# Patient Record
Sex: Male | Born: 1935 | Race: White | Hispanic: No | Smoking: Never smoker
Health system: Southern US, Community
[De-identification: ages and names within clinical notes are randomized; demographics above are authoritative.]

## PROBLEM LIST (undated history)

## (undated) DIAGNOSIS — F0280 Dementia in other diseases classified elsewhere without behavioral disturbance: Secondary | ICD-10-CM

## (undated) DIAGNOSIS — G8929 Other chronic pain: Secondary | ICD-10-CM

## (undated) DIAGNOSIS — R42 Dizziness and giddiness: Secondary | ICD-10-CM

## (undated) DIAGNOSIS — H9190 Unspecified hearing loss, unspecified ear: Secondary | ICD-10-CM

## (undated) DIAGNOSIS — M549 Dorsalgia, unspecified: Secondary | ICD-10-CM

## (undated) DIAGNOSIS — K56609 Unspecified intestinal obstruction, unspecified as to partial versus complete obstruction: Secondary | ICD-10-CM

## (undated) DIAGNOSIS — E079 Disorder of thyroid, unspecified: Secondary | ICD-10-CM

## (undated) DIAGNOSIS — G309 Alzheimer's disease, unspecified: Secondary | ICD-10-CM

## (undated) DIAGNOSIS — E871 Hypo-osmolality and hyponatremia: Secondary | ICD-10-CM

## (undated) DIAGNOSIS — R109 Unspecified abdominal pain: Secondary | ICD-10-CM

## (undated) DIAGNOSIS — D649 Anemia, unspecified: Secondary | ICD-10-CM

## (undated) DIAGNOSIS — K5732 Diverticulitis of large intestine without perforation or abscess without bleeding: Secondary | ICD-10-CM

## (undated) DIAGNOSIS — K469 Unspecified abdominal hernia without obstruction or gangrene: Secondary | ICD-10-CM

## (undated) DIAGNOSIS — M1990 Unspecified osteoarthritis, unspecified site: Secondary | ICD-10-CM

## (undated) DIAGNOSIS — R110 Nausea: Secondary | ICD-10-CM

## (undated) DIAGNOSIS — K219 Gastro-esophageal reflux disease without esophagitis: Secondary | ICD-10-CM

## (undated) DIAGNOSIS — I671 Cerebral aneurysm, nonruptured: Secondary | ICD-10-CM

---

## 2008-11-08 ENCOUNTER — Ambulatory Visit (HOSPITAL_COMMUNITY): Admission: RE | Admit: 2008-11-08 | Discharge: 2008-11-08 | Payer: Self-pay | Admitting: Nurse Practitioner

## 2011-05-09 ENCOUNTER — Emergency Department (HOSPITAL_COMMUNITY): Payer: Medicare Other

## 2011-05-09 ENCOUNTER — Encounter: Payer: Self-pay | Admitting: *Deleted

## 2011-05-09 ENCOUNTER — Inpatient Hospital Stay (HOSPITAL_COMMUNITY)
Admission: EM | Admit: 2011-05-09 | Discharge: 2011-05-12 | DRG: 389 | Disposition: A | Discharge status: Home / Self Care | Payer: Medicare Other | Attending: Family Medicine | Admitting: Family Medicine

## 2011-05-09 ENCOUNTER — Inpatient Hospital Stay (HOSPITAL_COMMUNITY): Payer: Medicare Other

## 2011-05-09 ENCOUNTER — Other Ambulatory Visit: Payer: Self-pay

## 2011-05-09 DIAGNOSIS — E871 Hypo-osmolality and hyponatremia: Secondary | ICD-10-CM

## 2011-05-09 DIAGNOSIS — E860 Dehydration: Secondary | ICD-10-CM

## 2011-05-09 DIAGNOSIS — R112 Nausea with vomiting, unspecified: Secondary | ICD-10-CM

## 2011-05-09 DIAGNOSIS — K56609 Unspecified intestinal obstruction, unspecified as to partial versus complete obstruction: Secondary | ICD-10-CM

## 2011-05-09 DIAGNOSIS — K560 Paralytic ileus: Principal | ICD-10-CM | POA: Diagnosis present

## 2011-05-09 DIAGNOSIS — R1110 Vomiting, unspecified: Secondary | ICD-10-CM

## 2011-05-09 LAB — URINALYSIS, ROUTINE W REFLEX MICROSCOPIC
Bilirubin Urine: NEGATIVE
Glucose, UA: NEGATIVE mg/dL
Hgb urine dipstick: NEGATIVE
Specific Gravity, Urine: 1.02 (ref 1.005–1.030)
pH: 6 (ref 5.0–8.0)

## 2011-05-09 LAB — COMPREHENSIVE METABOLIC PANEL
ALT: 27 U/L (ref 0–53)
AST: 35 U/L (ref 0–37)
Albumin: 3.9 g/dL (ref 3.5–5.2)
Alkaline Phosphatase: 60 U/L (ref 39–117)
GFR calc Af Amer: 90 mL/min — ABNORMAL LOW (ref >90)
Glucose, Bld: 150 mg/dL — ABNORMAL HIGH (ref 70–99)
Potassium: 3.6 mEq/L (ref 3.5–5.1)
Sodium: 120 mEq/L — ABNORMAL LOW (ref 135–145)
Total Protein: 7.4 g/dL (ref 6.0–8.3)

## 2011-05-09 LAB — DIFFERENTIAL
Basophils Absolute: 0 10*3/uL (ref 0.0–0.1)
Eosinophils Absolute: 0 10*3/uL (ref 0.0–0.7)
Lymphs Abs: 0.7 10*3/uL (ref 0.7–4.0)
Neutrophils Relative %: 78 % — ABNORMAL HIGH (ref 43–77)

## 2011-05-09 LAB — CBC
MCH: 32.2 pg (ref 26.0–34.0)
Platelets: 214 10*3/uL (ref 150–400)
RBC: 3.88 MIL/uL — ABNORMAL LOW (ref 4.22–5.81)
RDW: 13 % (ref 11.5–15.5)
WBC: 7.6 10*3/uL (ref 4.0–10.5)

## 2011-05-09 LAB — LACTIC ACID, PLASMA: Lactic Acid, Venous: 2.7 mmol/L — ABNORMAL HIGH (ref 0.5–2.2)

## 2011-05-09 LAB — TROPONIN I: Troponin I: <0.3 ng/mL (ref <0.30)

## 2011-05-09 MED ORDER — MORPHINE SULFATE 4 MG/ML IJ SOLN
4.0000 mg | Freq: Once | INTRAMUSCULAR | Status: AC
  Administered 2011-05-09: 4 mg via INTRAVENOUS
  Filled 2011-05-09: qty 1

## 2011-05-09 MED ORDER — ONDANSETRON HCL 4 MG/2ML IJ SOLN
4.0000 mg | Freq: Once | INTRAMUSCULAR | Status: AC
  Administered 2011-05-09: 4 mg via INTRAVENOUS
  Filled 2011-05-09: qty 2

## 2011-05-09 MED ORDER — MORPHINE SULFATE 2 MG/ML IJ SOLN
2.0000 mg | INTRAMUSCULAR | Status: DC | PRN
  Administered 2011-05-09 – 2011-05-10 (×3): 2 mg via INTRAVENOUS
  Filled 2011-05-09 (×4): qty 1

## 2011-05-09 MED ORDER — PNEUMOCOCCAL VAC POLYVALENT 25 MCG/0.5ML IJ INJ
0.5000 mL | INJECTION | INTRAMUSCULAR | Status: AC
  Filled 2011-05-09: qty 0.5

## 2011-05-09 MED ORDER — MOXIFLOXACIN HCL IN NACL 400 MG/250ML IV SOLN: 400.0000 mg | Freq: Once | INTRAVENOUS | Status: DC

## 2011-05-09 MED ORDER — SODIUM CHLORIDE 0.9 % IV SOLN
999.0000 mL | INTRAVENOUS | Status: DC
  Administered 2011-05-09: 16:00:00 via INTRAVENOUS
  Administered 2011-05-09: 999 mL via INTRAVENOUS

## 2011-05-09 MED ORDER — IOHEXOL 300 MG/ML  SOLN
100.0000 mL | Freq: Once | INTRAMUSCULAR | Status: AC | PRN
  Administered 2011-05-09: 100 mL via INTRAVENOUS

## 2011-05-09 MED ORDER — PANTOPRAZOLE SODIUM 40 MG IV SOLR
40.0000 mg | INTRAVENOUS | Status: DC
  Administered 2011-05-10 – 2011-05-11 (×2): 40 mg via INTRAVENOUS
  Filled 2011-05-09 (×2): qty 40

## 2011-05-09 MED ORDER — SODIUM CHLORIDE 0.9 % IV BOLUS (SEPSIS)
500.0000 mL | Freq: Once | INTRAVENOUS | Status: AC
  Administered 2011-05-09: 17:00:00 via INTRAVENOUS

## 2011-05-09 MED ORDER — ONDANSETRON HCL 4 MG PO TABS: 4.0000 mg | ORAL_TABLET | Freq: Four times a day (QID) | ORAL | Status: DC | PRN

## 2011-05-09 MED ORDER — INFLUENZA VIRUS VACC SPLIT PF IM SUSP
0.5000 mL | INTRAMUSCULAR | Status: AC
  Filled 2011-05-09: qty 0.5

## 2011-05-09 MED ORDER — ONDANSETRON HCL 4 MG/2ML IJ SOLN
4.0000 mg | Freq: Four times a day (QID) | INTRAMUSCULAR | Status: DC | PRN
  Administered 2011-05-10 (×2): 4 mg via INTRAVENOUS
  Filled 2011-05-09 (×2): qty 2

## 2011-05-09 MED ORDER — SODIUM CHLORIDE 0.9 % IV SOLN
INTRAVENOUS | Status: DC
  Administered 2011-05-09: 22:00:00 via INTRAVENOUS
  Administered 2011-05-10: 950 mL via INTRAVENOUS
  Administered 2011-05-11: 1000 mL via INTRAVENOUS

## 2011-05-09 MED ORDER — SODIUM CHLORIDE 0.9 % IV BOLUS (SEPSIS)
1000.0000 mL | Freq: Once | INTRAVENOUS | Status: AC
  Administered 2011-05-09: 1000 mL via INTRAVENOUS

## 2011-05-09 NOTE — ED Notes (Signed)
Pt states abdominal pain and vomiting. Hx of diverticulitis. Seen PMD yesterday. Pt actively vomiting in triage.

## 2011-05-09 NOTE — ED Notes (Signed)
Pt given CT scan bottles of contrast, started vomiting after initiating drinking of said fluid. Peyton Najjar from CT in room.

## 2011-05-09 NOTE — ED Notes (Signed)
Pt states is extremely nauseated with lower abdomin pain. Pt has vomited x 2.  Small amt yellow emesis noted. Pt is pallor in color with flushed cheeks. Son states this is abnormal coloring for his father. Labs drawn and sent, results pending.

## 2011-05-09 NOTE — ED Provider Notes (Signed)
Scribed for Joya Gaskins, MD, the patient was seen in room [facility] . This chart was scribed by Ellie Lunch.   CSN: [id number] Arrival date & time: 05/09/2011  3:56 PM   First MD Initiated Contact with Patient 05/09/11 1606      Chief Complaint  Patient presents with  . Emesis    Patient is a 77 y.o. male presenting with vomiting. The history is provided by the patient and a relative. No language interpreter was used.  Emesis  This is a new problem. The current episode started yesterday. The problem occurs continuously. The problem has been gradually worsening (improved yesterday, but worsening today). The emesis has an appearance of stomach contents. There has been no fever. Associated symptoms include abdominal pain, chills and diarrhea. Pertinent negatives include no fever.   Pt seen at 4:19 PM Larry Cox is a 77 y.o. male who presents to the Emergency Department complaining of emesis starting 1 day ago Pt starting having n/v and chills. Pt visited PC Dr. Olegario Messier in [place] and reports feeling improved last night. This morning n/v returned with associated diarrhea and abdominal pain. Pt has h/o diverticulitis. Denies CP, SOB, or fever. Pt not currently on abx, no history of abdominal surgeries.   Past Medical History  Diagnosis Date  . Diverticulitis     Past Surgical Hx - none   History  Substance Use Topics  . Smoking status: Former Games developer  . Smokeless tobacco: Not on file  . Alcohol Use: Yes     Occ     Review of Systems  Constitutional: Positive for chills. Negative for fever.  Respiratory: Negative for shortness of breath.   Cardiovascular: Negative for chest pain.  Gastrointestinal: Positive for vomiting, abdominal pain and diarrhea.  All other systems reviewed and are negative.  10 Systems reviewed and are negative for acute change except as noted in the HPI.   Allergies  Review of patient's allergies indicates no known allergies.  Home  Medications  No current outpatient prescriptions on file.  BP 103/87  Pulse 76  Resp 28  Ht 5\' 8"  (1.727 m)  Wt 163 lb (73.936 kg)  BMI 24.78 kg/m2  SpO2 99% BP 128/75  Pulse 64  Resp 26  Ht 5\' 8"  (1.727 m)  Wt 163 lb (73.936 kg)  BMI 24.78 kg/m2  SpO2 100% BP 137/71  Pulse 68  Temp(Src) 97.8 F (36.6 C) (Oral)  Resp 20  Ht 5\' 8"  (1.727 m)  Wt 163 lb (73.936 kg)  BMI 24.78 kg/m2  SpO2 99%    Physical Exam CONSTITUTIONAL: Well developed/well nourished, uncomfortable appearing HEAD AND FACE: Normocephalic/atraumatic EYES: EOMI/PERRL, no scleral icterus ENMT: Mucous membranes dry NECK: supple no meningeal signs SPINE:entire spine nontender CV: no murmurs/rubs/gallops noted LUNGS: Lungs are clear to auscultation bilaterally, no apparent distress ABDOMEN: soft, tender epigastric and LLQ. No rebound or guarding. Positive bowel sounds GU:no cva tenderness. No hernia. No testicular tenderness. Chaperone present. NEURO: Pt is awake/alert, moves all extremitiesx4 EXTREMITIES: pulses normal, full ROM SKIN: warm, color normal PSYCH: no abnormalities of mood noted ED Course  Procedures   6:28 PM Pt still with active vomiting, will need CT imaging for significant abd tenderness Labs reviewed - dehydration, hyponatremia  7:42 PM Pt improved.  Will admit for significant lab abnormality and active nausea Pt agreeble D/w dr Onalee Hua, will admit.  She requests CXR and troponin  OTHER DATA REVIEWED: Nursing notes, vital signs, and past medical records reviewed. xrays reviewed and considered  All labs/vitals reviewed and considered   DIAGNOSTIC STUDIES: Oxygen Saturation is 99% on room air, normal by my interpretation.      Labs Reviewed  CBC  DIFFERENTIAL  COMPREHENSIVE METABOLIC PANEL  LIPASE, BLOOD  URINALYSIS, ROUTINE W REFLEX MICROSCOPIC      MDM     Date: 05/09/2011  Rate: 66   Rhythm: normal sinus rhythm  QRS Axis: normal  Intervals: normal  ST/T  Wave abnormalities: normal  Conduction Disutrbances:none  Narrative Interpretation:   Old EKG Reviewed: none available   I personally performed the services described in this documentation, which was scribed in my presence. The recorded information has been reviewed and considered.           Joya Gaskins, MD 05/09/11 [phone]

## 2011-05-09 NOTE — H&P (Signed)
PCP:   Larry Perches, MD   Chief Complaint: n/v abd pain generalized  HPI: 77 yo male healthy with n/v and generalized abd pain with sudden onset yesterday morning.  No blood in vomit.  Denies diarrhea and actually has been constipated.  Denies fever.  He vomited several times yesterday and then seemed to improve.  His children convinced him to see his pcp today and he was much improved at that appt but then after that appt the symptoms returned with again n/v and generalized abd pain.  Never had surgery before.  Only h/o diverticulosis.  Has not been able to hold anything down.  Has not noticed his abd getting any bigger.  No dysuria but having difficulty with urination.    Review of Systems:  O/w neg.  Past Medical History: Past Medical History  Diagnosis Date  . Diverticulitis    History reviewed. No pertinent past surgical history.  Medications: Prior to Admission medications   Medication Sig Start Date End Date Taking? Authorizing Provider  omeprazole (PRILOSEC) 20 MG capsule Take 20 mg by mouth daily.      Historical Provider, MD    Allergies:  No Known Allergies  Social History:  reports that he has quit smoking. He does not have any smokeless tobacco history on file. He reports that he drinks alcohol. His drug history not on file.   Physical Exam: Filed Vitals:   05/09/11 1554 05/09/11 1650 05/09/11 1846 05/09/11 1858  BP: 103/87 128/75 137/71 137/71  Pulse: 76 64 70 68  Temp:    97.8 F (36.6 C)  TempSrc:    Oral  Resp: 28 26 20    Height: 5\' 8"  (1.727 m)     Weight: 73.936 kg (163 lb)     SpO2: 99% 100% 98% 99%   General appearance: alert, cooperative and no distress Neck: no adenopathy, no carotid bruit, no JVD, supple, symmetrical, trachea midline and thyroid not enlarged, symmetric, no tenderness/mass/nodules Resp: clear to auscultation bilaterally Cardio: regular rate and rhythm, S1, S2 normal, no murmur, click, rub or gallop GI: abnormal findings:   distended and hypoactive bowel sounds and ttp generalized mainly in lower quadrants, no r/g, nonacute abd Extremities: extremities normal, atraumatic, no cyanosis or edema Skin: Skin color, texture, turgor normal. No rashes or lesions Neurologic: Grossly normal   Labs on Admission:   [facility] 05/09/11 1607  NA 120*  K 3.6  CL 85*  CO2 20  GLUCOSE 150*  BUN 11  CREATININE 0.81  CALCIUM 9.5  MG --  PHOS --    Basename 05/09/11 1607  AST 35  ALT 27  ALKPHOS 60  BILITOT 0.4  PROT 7.4  ALBUMIN 3.9    Basename 05/09/11 1607  LIPASE 17  AMYLASE --    Basename 05/09/11 1607  WBC 7.6  NEUTROABS 5.9  HGB 12.5*  HCT 35.3*  MCV 91.0  PLT 214    Basename 05/09/11 1605  CKTOTAL --  CKMB --  CKMBINDEX --  TROPONINI <0.30   No results found for this basename: TSH,T4TOTAL,FREET3,T3FREE,THYROIDAB in the last 72 hours No results found for this basename: VITAMINB12:2,FOLATE:2,FERRITIN:2,TIBC:2,IRON:2,RETICCTPCT:2 in the last 72 hours  Radiological Exams on Admission: Ct Abdomen Pelvis W Contrast  05/09/2011  *RADIOLOGY REPORT*  Clinical Data: Abdominal pain, vomiting and chills.  CT ABDOMEN AND PELVIS WITH CONTRAST  Technique:  Multidetector CT imaging of the abdomen and pelvis was performed following the standard protocol during bolus administration of intravenous contrast.  Contrast: OMNIPAQUE IOHEXOL 300 MG/ML  IV SOLN  Comparison: None  Findings: The lung bases demonstrate mild vascular crowding and left lower lobe scarring change.  No effusion or infiltrate.  The heart is normal in size for age.  There is a hiatal hernia noted.  There is a low attenuation liver lesion which is a benign cyst. Smaller lesions are indeterminate but likely benign.  The spleen is normal in size.  No focal lesions.  The pancreas is normal.  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  Both kidneys are low-lying.  The stomach, duodenum, small  bowel and colon are unremarkable except for diffuse colonic diverticulosis mainly involving the sigmoid colon.  No findings for acute diverticulitis.  The duodenal diverticulum is also noted. The appendix is normal.  The aorta is normal in caliber.  There are mild scattered atherosclerotic changes but no dissection.  The major branch vessels are patent.  There are small scattered mesenteric and retroperitoneal lymph nodes but no mass or adenopathy.  The bladder, prostate gland and seminal vesicles are unremarkable. No pelvic mass or adenopathy.  There were bilateral inguinal hernias containing fat, left much larger than right.  The bony structures are unremarkable. Advanced degenerative changes noted in the spine.  IMPRESSION:  No acute abdominal/pelvic findings, mass lesions or lymphadenopathy.  Original Report Authenticated By: P. Loralie Champagne, M.D.   Dg Chest Portable 1 View  05/09/2011  *RADIOLOGY REPORT*  Clinical Data: Nausea and vomiting  PORTABLE CHEST - 1 VIEW  Comparison: None.  Findings: Cardiac silhouette is enlarged.  There is plate-like atelectasis at the left lung base.  No evidence of pleural fluid. No focal consolidation.  No pneumothorax.  IMPRESSION:  1.  Left basilar atelectasis. 2.  No evidence of pneumonia or edema.  Original Report Authenticated By: Genevive Bi, M.D.    Assessment/Plan Present on Admission:  77 yo male with acute onset n/v/gen abd pain for over 24 hours wax/waning highly suspect pSBO 1.  High suspicion for pSBO based on history and physical exam.  Ct abd/pelvis neg.  lft and lipase normal.  Will place ngt to decompress the bowel and place npo and obtain surgical consult to follow. Ck kub. 2.  Hyponatremia due to dehydration  Ns ivf 3.  Dehydration due to the above ivf 4.  H/o diverticulosis without any evidence of infection on ct scan.     zofran and morphine prn.  Full code.  [name] 05/09/2011, 8:55 PM

## 2011-05-10 LAB — COMPREHENSIVE METABOLIC PANEL
ALT: 28 U/L (ref 0–53)
Alkaline Phosphatase: 54 U/L (ref 39–117)
BUN: 7 mg/dL (ref 6–23)
CO2: 24 mEq/L (ref 19–32)
Chloride: 88 mEq/L — ABNORMAL LOW (ref 96–112)
GFR calc Af Amer: >90 mL/min (ref >90)
Glucose, Bld: 118 mg/dL — ABNORMAL HIGH (ref 70–99)
Potassium: 3.7 mEq/L (ref 3.5–5.1)
Sodium: 122 mEq/L — ABNORMAL LOW (ref 135–145)
Total Bilirubin: 0.3 mg/dL (ref 0.3–1.2)

## 2011-05-10 LAB — CBC
MCHC: 36.1 g/dL — ABNORMAL HIGH (ref 30.0–36.0)
MCV: 91.4 fL (ref 78.0–100.0)
Platelets: 228 10*3/uL (ref 150–400)
RDW: 13 % (ref 11.5–15.5)
WBC: 5.9 10*3/uL (ref 4.0–10.5)

## 2011-05-10 LAB — CARDIAC PANEL(CRET KIN+CKTOT+MB+TROPI)
CK, MB: 5.8 ng/mL — ABNORMAL HIGH (ref 0.3–4.0)
Relative Index: 3.4 — ABNORMAL HIGH (ref 0.0–2.5)
Total CK: 156 U/L (ref 7–232)
Troponin I: <0.3 ng/mL (ref <0.30)

## 2011-05-10 LAB — APTT: aPTT: 28 seconds (ref 24–37)

## 2011-05-10 LAB — PROTIME-INR: INR: 0.94 (ref 0.00–1.49)

## 2011-05-10 MED ORDER — LORAZEPAM 2 MG/ML IJ SOLN: 0.2500 mg | Freq: Once | INTRAMUSCULAR | Status: AC

## 2011-05-10 MED ORDER — LORAZEPAM 2 MG/ML IJ SOLN
INTRAMUSCULAR | Status: AC
  Administered 2011-05-10: 0.25 mg
  Filled 2011-05-10: qty 1

## 2011-05-10 MED ORDER — PROMETHAZINE HCL 25 MG/ML IJ SOLN
25.0000 mg | Freq: Once | INTRAMUSCULAR | Status: AC
  Administered 2011-05-10: 25 mg via INTRAVENOUS
  Filled 2011-05-10: qty 1

## 2011-05-10 MED ORDER — HALOPERIDOL LACTATE 5 MG/ML IJ SOLN
5.0000 mg | Freq: Once | INTRAMUSCULAR | Status: AC
  Administered 2011-05-10: 5 mg via INTRAVENOUS
  Filled 2011-05-10: qty 1

## 2011-05-10 MED ORDER — SODIUM CHLORIDE 0.9 % IJ SOLN
INTRAMUSCULAR | Status: AC
  Filled 2011-05-10: qty 3

## 2011-05-10 MED ORDER — BISACODYL 10 MG RE SUPP
10.0000 mg | Freq: Two times a day (BID) | RECTAL | Status: DC
  Administered 2011-05-10 – 2011-05-12 (×3): 10 mg via RECTAL
  Filled 2011-05-10 (×4): qty 1

## 2011-05-10 MED ORDER — LORAZEPAM 2 MG/ML IJ SOLN
INTRAMUSCULAR | Status: AC
  Filled 2011-05-10: qty 1

## 2011-05-10 NOTE — Progress Notes (Signed)
Daughter at bedside; pt pulled out NG tube; pt is confused and talking about random things; pt is more calm, but not oriented at this time. Notified Dr. Ardyth Harps, who stated to keep tube out.

## 2011-05-10 NOTE — Progress Notes (Signed)
Subjective: Feels much better today, her abdomen is less distended. Daughter present and updated.  Objective: Vital signs in last 24 hours: Temp:  [97.4 F (36.3 C)-97.8 F (36.6 C)] 97.4 F (36.3 C) (11/10 0600) Pulse Rate:  [58-76] 58  (11/10 0600) Resp:  [16-32] 16  (11/10 0600) BP: (103-150)/(68-87) 120/68 mmHg (11/10 0600) SpO2:  [96 %-100 %] 98 % (11/10 0600) Weight:  [73.936 kg (163 lb)-75.6 kg (166 lb 10.7 oz)] 166 lb 10.7 oz (75.6 kg) (11/09 2045) Weight change:  Last BM Date: 05/09/11  Intake/Output from previous day: 11/09 0701 - 11/10 0700 In: 0  Out: 1150 [Urine:1150] Total I/O In: -  Out: 450 [Urine:450]   Physical Exam: General: Alert, awake, oriented x3, in no acute distress. HEENT: No bruits, no goiter, hard of hearing Heart: Regular rate and rhythm, without murmurs, rubs, gallops. Lungs: Clear to auscultation bilaterally. Abdomen: Distended, hypoactive bowel sounds. Extremities: No clubbing cyanosis or edema with positive pedal pulses. Neuro: Grossly intact, nonfocal.    Lab Results: Basic Metabolic Panel:  Basename 05/10/11 0444 05/09/11 1607  NA 122* 120*  K 3.7 3.6  CL 88* 85*  CO2 24 20  GLUCOSE 118* 150*  BUN 7 11  CREATININE 0.67 0.81  CALCIUM 8.7 9.5  MG -- --  PHOS -- --   Liver Function Tests:  [facility] 05/10/11 0444 05/09/11 1607  AST 31 35  ALT 28 27  ALKPHOS 54 60  BILITOT 0.3 0.4  PROT 6.9 7.4  ALBUMIN 3.6 3.9    Basename 05/09/11 1607  LIPASE 17  AMYLASE --   No results found for this basename: AMMONIA:2 in the last 72 hours CBC:  Basename 05/10/11 0444 05/09/11 1607  WBC 5.9 7.6  NEUTROABS -- 5.9  HGB 13.0 12.5*  HCT 36.0* 35.3*  MCV 91.4 91.0  PLT 228 214   Cardiac Enzymes:  Basename 05/10/11 0728 05/09/11 2323 05/09/11 1605  CKTOTAL 179 156 --  CKMB 6.9* 5.8* --  CKMBINDEX -- -- --  TROPONINI <0.30 <0.30 <0.30   Coagulation:  Basename 05/10/11 0444  LABPROT 12.8  INR 0.94     Studies/Results: Dg Abd 1 View  05/09/2011  *RADIOLOGY REPORT*  Clinical Data: Abdominal distention.  ABDOMEN - 1 VIEW  Comparison: CT scan 05/09/2011.  Findings: Persistent contrast noted in the kidneys and bladder from the recent CT scan.  The bowel gas pattern is unremarkable.  The soft tissue shadows are maintained.  IMPRESSION: No findings for obstruction or perforation.  Original Report Authenticated By: P. Loralie Champagne, M.D.   Ct Abdomen Pelvis W Contrast  05/09/2011  *RADIOLOGY REPORT*  Clinical Data: Abdominal pain, vomiting and chills.  CT ABDOMEN AND PELVIS WITH CONTRAST  Technique:  Multidetector CT imaging of the abdomen and pelvis was performed following the standard protocol during bolus administration of intravenous contrast.  Contrast: OMNIPAQUE IOHEXOL 300 MG/ML IV SOLN  Comparison: None  Findings: The lung bases demonstrate mild vascular crowding and left lower lobe scarring change.  No effusion or infiltrate.  The heart is normal in size for age.  There is a hiatal hernia noted.  There is a low attenuation liver lesion which is a benign cyst. Smaller lesions are indeterminate but likely benign.  The spleen is normal in size.  No focal lesions.  The pancreas is normal.  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  Both kidneys are low-lying.  The stomach, duodenum, small bowel  and colon are unremarkable except for diffuse colonic diverticulosis mainly involving the sigmoid colon.  No findings for acute diverticulitis.  The duodenal diverticulum is also noted. The appendix is normal.  The aorta is normal in caliber.  There are mild scattered atherosclerotic changes but no dissection.  The major branch vessels are patent.  There are small scattered mesenteric and retroperitoneal lymph nodes but no mass or adenopathy.  The bladder, prostate gland and seminal vesicles are unremarkable. No pelvic mass or adenopathy.  There were  bilateral inguinal hernias containing fat, left much larger than right.  The bony structures are unremarkable. Advanced degenerative changes noted in the spine.  IMPRESSION:  No acute abdominal/pelvic findings, mass lesions or lymphadenopathy.  Original Report Authenticated By: P. Loralie Champagne, M.D.   Dg Chest Portable 1 View  05/09/2011  *RADIOLOGY REPORT*  Clinical Data: Nausea and vomiting  PORTABLE CHEST - 1 VIEW  Comparison: None.  Findings: Cardiac silhouette is enlarged.  There is plate-like atelectasis at the left lung base.  No evidence of pleural fluid. No focal consolidation.  No pneumothorax.  IMPRESSION:  1.  Left basilar atelectasis. 2.  No evidence of pneumonia or edema.  Original Report Authenticated By: Genevive Bi, M.D.    Medications: Scheduled Meds:   . bisacodyl  10 mg Rectal BID  . influenza  inactive virus vaccine  0.5 mL Intramuscular Tomorrow-1000  .  morphine injection  4 mg Intravenous Once  .  morphine injection  4 mg Intravenous Once  . ondansetron  4 mg Intravenous Once  . ondansetron  4 mg Intravenous Once  . ondansetron  4 mg Intravenous Once  . pantoprazole (PROTONIX) IV  40 mg Intravenous Q24H  . pneumococcal 23 valent vaccine  0.5 mL Intramuscular Tomorrow-1000  . sodium chloride  1,000 mL Intravenous Once  . sodium chloride  500 mL Intravenous Once  . sodium chloride      . DISCONTD: moxifloxacin  400 mg Intravenous Once   Continuous Infusions:   . sodium chloride 950 mL (05/10/11 0808)  . DISCONTD: sodium chloride Stopped (05/09/11 1729)   PRN Meds:.iohexol, morphine, ondansetron (ZOFRAN) IV, ondansetron  Assessment/Plan:  Principal Problem:  *Nausea & vomiting Active Problems:  SBO (small bowel obstruction)  Hyponatremia  Dehydration  #1 small bowel junction: Improving with NG suction. Appreciate Dr. Lovell Sheehan consultation note and recommendations. We'll continue NG tube for 24 more hours.  #2 hyponatremia: Is slightly improved from  120 to 122. This does appear to be hypovolemic, so we'll continue with normal saline fluid repletion. He does not have any neurologic abnormalities.   LOS: 1 day   HERNANDEZ ACOSTA,Levern Kalka 05/10/2011, 11:44 AM

## 2011-05-10 NOTE — Progress Notes (Signed)
Pts family left; after son left, patient did not want to listen. Pt began to jump up and down setting off bed alarm, pt would not get back in bed. Pt is alert and oriented times 3, just did not want to follow commands. Pt put foot on me to push me away and stated inappropriate comments. Notified Dr. Ardyth Harps, ordered small dose of ativan to calm patient down, will notify pts daughters. Bed alarm on for safety, will continue to monitor patient

## 2011-05-10 NOTE — Consult Note (Signed)
Reason for Consult: Abdominal distention Referring Physician: Hospitalist  Larry Cox is an 77 y.o. male.  HPI: Patient is an 77 year old white male who presented emergency room with worsening intermittent nausea, vomiting, and abdominal distention. The patient is a poor historian. It appears that he has had this intermittently over the past few days. A daughter that is with him states he was constipated earlier this week. He denies any abdominal pain. He complains about the NG tube.  Past Medical History  Diagnosis Date  . Diverticulitis     History reviewed. No pertinent past surgical history.  History reviewed. No pertinent family history.  Social History:  reports that he has quit smoking. He does not have any smokeless tobacco history on file. He reports that he drinks about 1.2 ounces of alcohol per week. He reports that he uses illicit drugs.  Allergies: No Known Allergies  Medications: I have reviewed the patient's current medications.  Results for orders placed during the hospital encounter of 05/09/11 (from the past 48 hour(s))  TROPONIN I     Status: Normal   Collection Time   05/09/11  4:05 PM      Component Value Range Comment   Troponin I <0.30  <0.30 (ng/mL)   CBC     Status: Abnormal   Collection Time   05/09/11  4:07 PM      Component Value Range Comment   WBC 7.6  4.0 - 10.5 (K/uL)    RBC 3.88 (*) 4.22 - 5.81 (MIL/uL)    Hemoglobin 12.5 (*) 13.0 - 17.0 (g/dL)    HCT 16.1 (*) 09.6 - 52.0 (%)    MCV 91.0  78.0 - 100.0 (fL)    MCH 32.2  26.0 - 34.0 (pg)    MCHC 35.4  30.0 - 36.0 (g/dL)    RDW 04.5  40.9 - 81.1 (%)    Platelets 214  150 - 400 (K/uL)   DIFFERENTIAL     Status: Abnormal   Collection Time   05/09/11  4:07 PM      Component Value Range Comment   Neutrophils Relative 78 (*) 43 - 77 (%)    Neutro Abs 5.9  1.7 - 7.7 (K/uL)    Lymphocytes Relative 9 (*) 12 - 46 (%)    Lymphs Abs 0.7  0.7 - 4.0 (K/uL)    Monocytes Relative 13 (*) 3 - 12 (%)    Monocytes Absolute 1.0  0.1 - 1.0 (K/uL)    Eosinophils Relative 0  0 - 5 (%)    Eosinophils Absolute 0.0  0.0 - 0.7 (K/uL)    Basophils Relative 0  0 - 1 (%)    Basophils Absolute 0.0  0.0 - 0.1 (K/uL)   COMPREHENSIVE METABOLIC PANEL     Status: Abnormal   Collection Time   05/09/11  4:07 PM      Component Value Range Comment   Sodium 120 (*) 135 - 145 (mEq/L)    Potassium 3.6  3.5 - 5.1 (mEq/L)    Chloride 85 (*) 96 - 112 (mEq/L)    CO2 20  19 - 32 (mEq/L)    Glucose, Bld 150 (*) 70 - 99 (mg/dL)    BUN 11  6 - 23 (mg/dL)    Creatinine, Ser 9.14  0.50 - 1.35 (mg/dL)    Calcium 9.5  8.4 - 10.5 (mg/dL)    Total Protein 7.4  6.0 - 8.3 (g/dL)    Albumin 3.9  3.5 - 5.2 (g/dL)  AST 35  0 - 37 (U/L)    ALT 27  0 - 53 (U/L)    Alkaline Phosphatase 60  39 - 117 (U/L)    Total Bilirubin 0.4  0.3 - 1.2 (mg/dL)    GFR calc non Af Amer 78 (*) >90 (mL/min)    GFR calc Af Amer 90 (*) >90 (mL/min)   LIPASE, BLOOD     Status: Normal   Collection Time   05/09/11  4:07 PM      Component Value Range Comment   Lipase 17  11 - 59 (U/L)   LACTIC ACID, PLASMA     Status: Abnormal   Collection Time   05/09/11  4:31 PM      Component Value Range Comment   Lactic Acid, Venous 2.7 (*) 0.5 - 2.2 (mmol/L)   URINALYSIS, ROUTINE W REFLEX MICROSCOPIC     Status: Abnormal   Collection Time   05/09/11  5:36 PM      Component Value Range Comment   Color, Urine YELLOW  YELLOW     Appearance CLEAR  CLEAR     Specific Gravity, Urine 1.020  1.005 - 1.030     pH 6.0  5.0 - 8.0     Glucose, UA NEGATIVE  NEGATIVE (mg/dL)    Hgb urine dipstick NEGATIVE  NEGATIVE     Bilirubin Urine NEGATIVE  NEGATIVE     Ketones, ur 40 (*) NEGATIVE (mg/dL)    Protein, ur NEGATIVE  NEGATIVE (mg/dL)    Urobilinogen, UA 0.2  0.0 - 1.0 (mg/dL)    Nitrite NEGATIVE  NEGATIVE     Leukocytes, UA NEGATIVE  NEGATIVE  MICROSCOPIC NOT DONE ON URINES WITH NEGATIVE PROTEIN, BLOOD, LEUKOCYTES, NITRITE, OR GLUCOSE <1000 mg/dL.  CARDIAC  PANEL(CRET KIN+CKTOT+MB+TROPI)     Status: Abnormal   Collection Time   05/09/11 11:23 PM      Component Value Range Comment   Total CK 156  7 - 232 (U/L)    CK, MB 5.8 (*) 0.3 - 4.0 (ng/mL)    Troponin I <0.30  <0.30 (ng/mL)    Relative Index 3.7 (*) 0.0 - 2.5    COMPREHENSIVE METABOLIC PANEL     Status: Abnormal   Collection Time   05/10/11  4:44 AM      Component Value Range Comment   Sodium 122 (*) 135 - 145 (mEq/L)    Potassium 3.7  3.5 - 5.1 (mEq/L)    Chloride 88 (*) 96 - 112 (mEq/L)    CO2 24  19 - 32 (mEq/L)    Glucose, Bld 118 (*) 70 - 99 (mg/dL)    BUN 7  6 - 23 (mg/dL)    Creatinine, Ser 1.61  0.50 - 1.35 (mg/dL)    Calcium 8.7  8.4 - 10.5 (mg/dL)    Total Protein 6.9  6.0 - 8.3 (g/dL)    Albumin 3.6  3.5 - 5.2 (g/dL)    AST 31  0 - 37 (U/L)    ALT 28  0 - 53 (U/L)    Alkaline Phosphatase 54  39 - 117 (U/L)    Total Bilirubin 0.3  0.3 - 1.2 (mg/dL)    GFR calc non Af Amer 84 (*) >90 (mL/min)    GFR calc Af Amer >90  >90 (mL/min)   CBC     Status: Abnormal   Collection Time   05/10/11  4:44 AM      Component Value Range Comment   WBC 5.9  4.0 - 10.5 (K/uL)    RBC 3.94 (*) 4.22 - 5.81 (MIL/uL)    Hemoglobin 13.0  13.0 - 17.0 (g/dL)    HCT 16.1 (*) 09.6 - 52.0 (%)    MCV 91.4  78.0 - 100.0 (fL)    MCH 33.0  26.0 - 34.0 (pg)    MCHC 36.1 (*) 30.0 - 36.0 (g/dL)    RDW 04.5  40.9 - 81.1 (%)    Platelets 228  150 - 400 (K/uL)   PROTIME-INR     Status: Normal   Collection Time   05/10/11  4:44 AM      Component Value Range Comment   Prothrombin Time 12.8  11.6 - 15.2 (seconds)    INR 0.94  0.00 - 1.49    APTT     Status: Normal   Collection Time   05/10/11  4:44 AM      Component Value Range Comment   aPTT 28  24 - 37 (seconds)   CARDIAC PANEL(CRET KIN+CKTOT+MB+TROPI)     Status: Abnormal   Collection Time   05/10/11  7:28 AM      Component Value Range Comment   Total CK 179  7 - 232 (U/L)    CK, MB 6.9 (*) 0.3 - 4.0 (ng/mL)    Troponin I <0.30  <0.30  (ng/mL)    Relative Index 3.9 (*) 0.0 - 2.5      Dg Abd 1 View  05/09/2011  *RADIOLOGY REPORT*  Clinical Data: Abdominal distention.  ABDOMEN - 1 VIEW  Comparison: CT scan 05/09/2011.  Findings: Persistent contrast noted in the kidneys and bladder from the recent CT scan.  The bowel gas pattern is unremarkable.  The soft tissue shadows are maintained.  IMPRESSION: No findings for obstruction or perforation.  Original Report Authenticated By: P. Loralie Champagne, M.D.   Ct Abdomen Pelvis W Contrast  05/09/2011  *RADIOLOGY REPORT*  Clinical Data: Abdominal pain, vomiting and chills.  CT ABDOMEN AND PELVIS WITH CONTRAST  Technique:  Multidetector CT imaging of the abdomen and pelvis was performed following the standard protocol during bolus administration of intravenous contrast.  Contrast: OMNIPAQUE IOHEXOL 300 MG/ML IV SOLN  Comparison: None  Findings: The lung bases demonstrate mild vascular crowding and left lower lobe scarring change.  No effusion or infiltrate.  The heart is normal in size for age.  There is a hiatal hernia noted.  There is a low attenuation liver lesion which is a benign cyst. Smaller lesions are indeterminate but likely benign.  The spleen is normal in size.  No focal lesions.  The pancreas is normal.  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  Both kidneys are low-lying.  The stomach, duodenum, small bowel and colon are unremarkable except for diffuse colonic diverticulosis mainly involving the sigmoid colon.  No findings for acute diverticulitis.  The duodenal diverticulum is also noted. The appendix is normal.  The aorta is normal in caliber.  There are mild scattered atherosclerotic changes but no dissection.  The major branch vessels are patent.  There are small scattered mesenteric and retroperitoneal lymph nodes but no mass or adenopathy.  The bladder, prostate gland and seminal vesicles are unremarkable. No pelvic mass  or adenopathy.  There were bilateral inguinal hernias containing fat, left much larger than right.  The bony structures are unremarkable. Advanced degenerative changes noted in the spine.  IMPRESSION:  No acute abdominal/pelvic findings, mass lesions or  lymphadenopathy.  Original Report Authenticated By: P. Loralie Champagne, M.D.   Dg Chest Portable 1 View  05/09/2011  *RADIOLOGY REPORT*  Clinical Data: Nausea and vomiting  PORTABLE CHEST - 1 VIEW  Comparison: None.  Findings: Cardiac silhouette is enlarged.  There is plate-like atelectasis at the left lung base.  No evidence of pleural fluid. No focal consolidation.  No pneumothorax.  IMPRESSION:  1.  Left basilar atelectasis. 2.  No evidence of pneumonia or edema.  Original Report Authenticated By: Genevive Bi, M.D.    ROS: See chart Blood pressure 120/68, pulse 58, temperature 97.4 F (36.3 C), temperature source Oral, resp. rate 16, height 5\' 8"  (1.727 m), weight 75.6 kg (166 lb 10.7 oz), SpO2 98.00%. 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 Lungs: Clear to auscultation with equal breath sounds bilaterally. Heart: Regular rate and rhythm without S3, S4, murmurs. Abdomen: Soft, slightly distended, no hepatosplenomegaly, masses, or hernias appreciated. Minimal bowel sounds heard. Rectal, deferred at this time  Assessment/Plan: Impression: Abdominal distention of unknown etiology, more likely ileus versus mechanical in nature. No acute surgical intervention warranted at this time. He states he had a colonoscopy in the past, though we have no record of this. Plan: Would continue NG tube decompression at this time. Will review labs in a.m. Dulcolax suppositories ordered.  Angeleah Labrake A 05/10/2011, 8:58 AM

## 2011-05-10 NOTE — Progress Notes (Signed)
CRITICAL VALUE ALERT  Critical value received: CK MB 6.9  Date of notification:  05/10/2011  Time of notification:  0850  Critical value read back:yes  Nurse who received alert:  Tiburcio Bash, RN  MD notified (1st page):  Dr. Ardyth Harps  Time of first page:  [phone]  MD notified (2nd page):  Time of second page:  Responding MD: Dr. Ardyth Harps  Time MD responded:  [phone]

## 2011-05-11 LAB — MAGNESIUM: Magnesium: 2.2 mg/dL (ref 1.5–2.5)

## 2011-05-11 LAB — CBC
MCV: 92.9 fL (ref 78.0–100.0)
Platelets: 216 10*3/uL (ref 150–400)
RDW: 13.5 % (ref 11.5–15.5)
WBC: 4.4 10*3/uL (ref 4.0–10.5)

## 2011-05-11 LAB — BASIC METABOLIC PANEL
Calcium: 8.2 mg/dL — ABNORMAL LOW (ref 8.4–10.5)
Creatinine, Ser: 0.82 mg/dL (ref 0.50–1.35)
GFR calc Af Amer: 90 mL/min — ABNORMAL LOW (ref >90)

## 2011-05-11 LAB — PHOSPHORUS: Phosphorus: 2.7 mg/dL (ref 2.3–4.6)

## 2011-05-11 MED ORDER — POLYETHYLENE GLYCOL 3350 17 G PO PACK
17.0000 g | PACK | Freq: Every day | ORAL | Status: DC
  Administered 2011-05-11 – 2011-05-12 (×2): 17 g via ORAL
  Filled 2011-05-11 (×2): qty 1

## 2011-05-11 MED ORDER — POTASSIUM CHLORIDE CRYS ER 20 MEQ PO TBCR
40.0000 meq | EXTENDED_RELEASE_TABLET | Freq: Once | ORAL | Status: AC
  Administered 2011-05-11: 40 meq via ORAL
  Filled 2011-05-11: qty 2

## 2011-05-11 NOTE — Progress Notes (Signed)
Subjective: Events of last night noted. He did need to be reoriented this morning.  Objective: Vital signs in last 24 hours: Temp:  [97.5 F (36.4 C)-98.3 F (36.8 C)] 98.3 F (36.8 C) (11/11 0600) Pulse Rate:  [62-83] 66  (11/11 0755) Resp:  [14-20] 20  (11/11 0600) BP: (118-133)/(58-81) 118/58 mmHg (11/11 0600) SpO2:  [91 %-95 %] 95 % (11/11 0755) Last BM Date: 05/09/11  Intake/Output from previous day: 11/10 0701 - 11/11 0700 In: 0  Out: 450 [Urine:450] Intake/Output this shift:    GI: soft, non-tender; bowel sounds normal; no masses,  no organomegaly  Lab Results:   [facility] 05/11/11 0640 05/10/11 0444  WBC 4.4 5.9  HGB 11.5* 13.0  HCT 32.8* 36.0*  PLT 216 228   BMET  Basename 05/11/11 0640 05/10/11 0444  NA 131* 122*  K 3.3* 3.7  CL 100 88*  CO2 25 24  GLUCOSE 76 118*  BUN 7 7  CREATININE 0.82 0.67  CALCIUM 8.2* 8.7   PT/INR  Basename 05/10/11 0444  LABPROT 12.8  INR 0.94    Studies/Results: Dg Abd 1 View  05/09/2011  *RADIOLOGY REPORT*  Clinical Data: Abdominal distention.  ABDOMEN - 1 VIEW  Comparison: CT scan 05/09/2011.  Findings: Persistent contrast noted in the kidneys and bladder from the recent CT scan.  The bowel gas pattern is unremarkable.  The soft tissue shadows are maintained.  IMPRESSION: No findings for obstruction or perforation.  Original Report Authenticated By: P. Loralie Champagne, M.D.   Ct Abdomen Pelvis W Contrast  05/09/2011  *RADIOLOGY REPORT*  Clinical Data: Abdominal pain, vomiting and chills.  CT ABDOMEN AND PELVIS WITH CONTRAST  Technique:  Multidetector CT imaging of the abdomen and pelvis was performed following the standard protocol during bolus administration of intravenous contrast.  Contrast: OMNIPAQUE IOHEXOL 300 MG/ML IV SOLN  Comparison: None  Findings: The lung bases demonstrate mild vascular crowding and left lower lobe scarring change.  No effusion or infiltrate.  The heart is normal in size for age.  There  is a hiatal hernia noted.  There is a low attenuation liver lesion which is a benign cyst. Smaller lesions are indeterminate but likely benign.  The spleen is normal in size.  No focal lesions.  The pancreas is normal.  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  Both kidneys are low-lying.  The stomach, duodenum, small bowel and colon are unremarkable except for diffuse colonic diverticulosis mainly involving the sigmoid colon.  No findings for acute diverticulitis.  The duodenal diverticulum is also noted. The appendix is normal.  The aorta is normal in caliber.  There are mild scattered atherosclerotic changes but no dissection.  The major branch vessels are patent.  There are small scattered mesenteric and retroperitoneal lymph nodes but no mass or adenopathy.  The bladder, prostate gland and seminal vesicles are unremarkable. No pelvic mass or adenopathy.  There were bilateral inguinal hernias containing fat, left much larger than right.  The bony structures are unremarkable. Advanced degenerative changes noted in the spine.  IMPRESSION:  No acute abdominal/pelvic findings, mass lesions or lymphadenopathy.  Original Report Authenticated By: P. Loralie Champagne, M.D.   Dg Chest Portable 1 View  05/09/2011  *RADIOLOGY REPORT*  Clinical Data: Nausea and vomiting  PORTABLE CHEST - 1 VIEW  Comparison: None.  Findings: Cardiac silhouette is enlarged.  There is plate-like atelectasis at the left lung base.  No evidence of  pleural fluid. No focal consolidation.  No pneumothorax.  IMPRESSION:  1.  Left basilar atelectasis. 2.  No evidence of pneumonia or edema.  Original Report Authenticated By: Genevive Bi, M.D.    Anti-infectives: Anti-infectives     Start     Dose/Rate Route Frequency Ordered Stop   05/09/11 1745   moxifloxacin (AVELOX) IVPB 400 mg  Status:  Discontinued        400 mg 250 mL/hr over 60 Minutes Intravenous  Once 05/09/11 1737  05/09/11 1749          Assessment/Plan: Impression: Ileus of unknown etiology, resolving Hyponatremia, resolving Plan: We'll transfer full liquid diet given his active bowel sounds. Hopefully, his diet can be against. He does not have a surgical abdomen at this point.  LOS: 2 days    Shuan Statzer A 05/11/2011

## 2011-05-11 NOTE — Progress Notes (Signed)
Around 2345 patient awoke combative, Hitting staff & cursing. Patient was cleaned, repositioned & made comfortable. Sat with patient & daughter for about 20 minutes. Daughter is concerned, states her father has never been this way before.

## 2011-05-11 NOTE — Progress Notes (Signed)
pts daughter, Chales Abrahams, arrived and was concerned about fathers behavior, explained it was probably reaction to ativan and would wear off in a couple of hours; pts daughter seemed upset and frustrated, tried to explain again; pts family seems less upset at end of shift; MD will speak with family in the am

## 2011-05-11 NOTE — Progress Notes (Signed)
Pts daughter Clydie Braun at bedside; pt wet his pants and bed; when staff tried to assist patient in changing he became very upset, swinging, punching, cussing, and kicking; pts daughter tried to calm patient down and explain why he was being changed; it took 5 nurses to calm him down while trying to change his wet clothes and sheets; pt was still physically and verbally combative; pts son, bill, arrived and patient was still agitated and upset.

## 2011-05-11 NOTE — Progress Notes (Signed)
Subjective: Yesterday's events noted. Today patient appears calm and collected. Daughter is present at bedside.  Objective: Vital signs in last 24 hours: Temp:  [97.5 F (36.4 C)-98.3 F (36.8 C)] 98.3 F (36.8 C) (11/11 0600) Pulse Rate:  [62-83] 66  (11/11 0755) Resp:  [14-20] 20  (11/11 0600) BP: (118-133)/(58-81) 118/58 mmHg (11/11 0600) SpO2:  [91 %-95 %] 95 % (11/11 0755) Weight change:  Last BM Date: 05/09/11  Intake/Output from previous day: 11/10 0701 - 11/11 0700 In: 0  Out: 450 [Urine:450] Total I/O In: 360 [P.O.:360] Out: -    Physical Exam: General: Alert, awake, oriented x3, in no acute distress. HEENT: No bruits, no goiter. Heart: Regular rate and rhythm, without murmurs, rubs, gallops. Lungs: Clear to auscultation bilaterally. Abdomen: Distended, hypoactive bowel sounds.. Extremities: No clubbing cyanosis or edema with positive pedal pulses. Neuro: Grossly intact, nonfocal.    Lab Results: Basic Metabolic Panel:  Basename 05/11/11 0640 05/10/11 0444  NA 131* 122*  K 3.3* 3.7  CL 100 88*  CO2 25 24  GLUCOSE 76 118*  BUN 7 7  CREATININE 0.82 0.67  CALCIUM 8.2* 8.7  MG 2.2 --  PHOS 2.7 --   Liver Function Tests:  [facility] 05/10/11 0444 05/09/11 1607  AST 31 35  ALT 28 27  ALKPHOS 54 60  BILITOT 0.3 0.4  PROT 6.9 7.4  ALBUMIN 3.6 3.9    Basename 05/09/11 1607  LIPASE 17  AMYLASE --   CBC:  Basename 05/11/11 0640 05/10/11 0444 05/09/11 1607  WBC 4.4 5.9 --  NEUTROABS -- -- 5.9  HGB 11.5* 13.0 --  HCT 32.8* 36.0* --  MCV 92.9 91.4 --  PLT 216 228 --   Cardiac Enzymes:  Basename 05/10/11 1507 05/10/11 0728 05/09/11 2323  CKTOTAL 238* 179 156  CKMB 8.1* 6.9* 5.8*  CKMBINDEX -- -- --  TROPONINI <0.30 <0.30 <0.30   Coagulation:  Basename 05/10/11 0444  LABPROT 12.8  INR 0.94    Studies/Results: Dg Abd 1 View  05/09/2011  *RADIOLOGY REPORT*  Clinical Data: Abdominal distention.  ABDOMEN - 1 VIEW  Comparison: CT scan  05/09/2011.  Findings: Persistent contrast noted in the kidneys and bladder from the recent CT scan.  The bowel gas pattern is unremarkable.  The soft tissue shadows are maintained.  IMPRESSION: No findings for obstruction or perforation.  Original Report Authenticated By: P. Loralie Champagne, M.D.   Ct Abdomen Pelvis W Contrast  05/09/2011  *RADIOLOGY REPORT*  Clinical Data: Abdominal pain, vomiting and chills.  CT ABDOMEN AND PELVIS WITH CONTRAST  Technique:  Multidetector CT imaging of the abdomen and pelvis was performed following the standard protocol during bolus administration of intravenous contrast.  Contrast: OMNIPAQUE IOHEXOL 300 MG/ML IV SOLN  Comparison: None  Findings: The lung bases demonstrate mild vascular crowding and left lower lobe scarring change.  No effusion or infiltrate.  The heart is normal in size for age.  There is a hiatal hernia noted.  There is a low attenuation liver lesion which is a benign cyst. Smaller lesions are indeterminate but likely benign.  The spleen is normal in size.  No focal lesions.  The pancreas is normal.  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  Both kidneys are low-lying.  The stomach, duodenum, small bowel and colon are unremarkable except for diffuse colonic diverticulosis mainly involving the sigmoid colon.  No findings for acute diverticulitis.  The duodenal diverticulum is  also noted. The appendix is normal.  The aorta is normal in caliber.  There are mild scattered atherosclerotic changes but no dissection.  The major branch vessels are patent.  There are small scattered mesenteric and retroperitoneal lymph nodes but no mass or adenopathy.  The bladder, prostate gland and seminal vesicles are unremarkable. No pelvic mass or adenopathy.  There were bilateral inguinal hernias containing fat, left much larger than right.  The bony structures are unremarkable. Advanced degenerative changes noted in  the spine.  IMPRESSION:  No acute abdominal/pelvic findings, mass lesions or lymphadenopathy.  Original Report Authenticated By: P. Loralie Champagne, M.D.   Dg Chest Portable 1 View  05/09/2011  *RADIOLOGY REPORT*  Clinical Data: Nausea and vomiting  PORTABLE CHEST - 1 VIEW  Comparison: None.  Findings: Cardiac silhouette is enlarged.  There is plate-like atelectasis at the left lung base.  No evidence of pleural fluid. No focal consolidation.  No pneumothorax.  IMPRESSION:  1.  Left basilar atelectasis. 2.  No evidence of pneumonia or edema.  Original Report Authenticated By: Genevive Bi, M.D.    Medications: Scheduled Meds:   . bisacodyl  10 mg Rectal BID  . haloperidol lactate  5 mg Intravenous Once  . influenza  inactive virus vaccine  0.5 mL Intramuscular Tomorrow-1000  . LORazepam      . LORazepam      . LORazepam  0.26 mg Intravenous Once  . pantoprazole (PROTONIX) IV  40 mg Intravenous Q24H  . pneumococcal 23 valent vaccine  0.5 mL Intramuscular Tomorrow-1000  . polyethylene glycol  17 g Oral Daily  . promethazine  25 mg Intravenous Once  . sodium chloride       Continuous Infusions:   . sodium chloride 1,000 mL (05/11/11 0555)   PRN Meds:.morphine, ondansetron (ZOFRAN) IV, ondansetron  Assessment/Plan:  Principal Problem:  *Nausea & vomiting Active Problems:  SBO (small bowel obstruction)  Hyponatremia  Dehydration  #1 small bowel obstruction/ileus: Seems to be resolving. Is on a full liquid diet and tolerating foods. Has had bowel movements today. Recheck abdominal film in the morning.  #2 hyponatremia: Today sodium is up to 131.  #3 hypokalemia: Likely related to NG suction. Replete orally. Check magnesium level.   LOS: 2 days   Larry Cox,Larry Cox 05/11/2011, 10:33 AM

## 2011-05-11 NOTE — Progress Notes (Signed)
Around 2000, patient became combative & cursing at staff & family, Md notified, orders given & carried out. Patient daughter thinks that it was the ativan causing his behavior.

## 2011-05-12 ENCOUNTER — Inpatient Hospital Stay (HOSPITAL_COMMUNITY): Payer: Medicare Other

## 2011-05-12 LAB — CBC
HCT: 33.9 % — ABNORMAL LOW (ref 39.0–52.0)
RDW: 13.3 % (ref 11.5–15.5)
WBC: 4.2 10*3/uL (ref 4.0–10.5)

## 2011-05-12 LAB — BASIC METABOLIC PANEL
Chloride: 102 mEq/L (ref 96–112)
Creatinine, Ser: 0.73 mg/dL (ref 0.50–1.35)
GFR calc Af Amer: >90 mL/min (ref >90)
Potassium: 3.5 mEq/L (ref 3.5–5.1)

## 2011-05-12 MED ORDER — PANTOPRAZOLE SODIUM 40 MG PO TBEC: 40.0000 mg | DELAYED_RELEASE_TABLET | Freq: Every day | ORAL | Status: DC

## 2011-05-12 MED ORDER — POLYETHYLENE GLYCOL 3350 17 G PO PACK: 17.0000 g | PACK | Freq: Every day | ORAL | Status: AC

## 2011-05-12 NOTE — Progress Notes (Signed)
D/c instructions reviewed with patient and daughter. Verbalized understanding. Pt dc'd to home with daughter. Schonewitz, Candelaria Stagers 05/12/2011

## 2011-05-12 NOTE — Discharge Summary (Signed)
  Physician Discharge Summary  Patient ID: Larry Cox MRN: [id number] DOB/AGE: December 21, 1921 77 y.o.  Admit date: 05/09/2011 Discharge date: 05/12/2011  Primary Care Physician:  He has a primary care physician in [place]  Discharge Diagnoses:    Principal Problem:  *Nausea & vomiting Active Problems:  SBO (small bowel obstruction)  Hyponatremia  Dehydration    Current Discharge Medication List    START taking these medications   Details  polyethylene glycol (MIRALAX / GLYCOLAX) packet Take 17 g by mouth daily. Qty: 14 each, Refills: 3      CONTINUE these medications which have NOT CHANGED   Details  aspirin 325 MG tablet Take 325 mg by mouth daily.      fish oil-omega-3 fatty acids 1000 MG capsule Take 1 g by mouth daily.      multivitamin-iron-minerals-folic acid (CENTRUM) chewable tablet Chew 1 tablet by mouth daily.      omeprazole (PRILOSEC) 20 MG capsule Take 20 mg by mouth daily.           Disposition and Follow-up:  Patient will be discharged home today in stable and improved condition. Will followup with primary care physician in 3-4 weeks following discharge.  Consults:  general surgery Dr. Franky Macho   Significant Diagnostic Studies:  Dg Abd 1 View  05/12/2011  *RADIOLOGY REPORT*  Clinical Data: Follow up ileus  ABDOMEN - 1 VIEW  Comparison: 05/09/2011  Findings: There is a marked scoliosis deformity affecting the lumbar spine which is convex to the left.  Multilevel degenerative disc disease is noted.  Bowel gas pattern is unremarkable.  There are no dilated loops of small bowel or air-fluid levels.  No abnormal abdominal or pelvic calcifications.  IMPRESSION:  1.  Normal bowel gas pattern noted.  Original Report Authenticated By: Rosealee Albee, M.D.    Brief H and P: For complete details please refer to admission H and P, but in brief patient is a very pleasant 77 year old gentleman who is very active who presented to the hospital with  complaints of diffuse abdominal pain, nausea, vomiting for 24 hours.    Hospital Course:  Principal Problem:  *Nausea & vomiting Active Problems:  SBO (small bowel obstruction)  Hyponatremia  Dehydration  #1 ileus of undetermined etiology: He had significant abdominal distention on admission. This has improved with NG tube suctioning. Abdominal x-rays show no evidence for free air or bowel dilatation. Consultation with Dr. Lovell Sheehan was obtained. Patient maintained a nonsurgical abdomen. Patient's NG tube has been out for 36 hours and he's been tolerating a solid diet and having regular bowel movements and we believe he is ready for discharge home today. I have asked him to take a stool softener and MiraLAX daily.  Time spent on Discharge: Greater than 30 minutes.  SignedChaya Jan 05/12/2011, 10:48 AM

## 2011-08-15 DIAGNOSIS — R42 Dizziness and giddiness: Secondary | ICD-10-CM | POA: Diagnosis not present

## 2011-10-23 DIAGNOSIS — N410 Acute prostatitis: Secondary | ICD-10-CM | POA: Diagnosis not present

## 2011-11-14 ENCOUNTER — Inpatient Hospital Stay (HOSPITAL_COMMUNITY): Payer: Medicare Other

## 2011-11-14 ENCOUNTER — Emergency Department (HOSPITAL_COMMUNITY): Payer: Medicare Other

## 2011-11-14 ENCOUNTER — Encounter (HOSPITAL_COMMUNITY): Payer: Self-pay | Admitting: *Deleted

## 2011-11-14 ENCOUNTER — Inpatient Hospital Stay (HOSPITAL_COMMUNITY)
Admission: EM | Admit: 2011-11-14 | Discharge: 2011-11-16 | DRG: 641 | Disposition: A | Discharge status: Home / Self Care | Payer: Medicare Other | Attending: Internal Medicine | Admitting: Internal Medicine

## 2011-11-14 DIAGNOSIS — M545 Low back pain, unspecified: Secondary | ICD-10-CM | POA: Diagnosis present

## 2011-11-14 DIAGNOSIS — E861 Hypovolemia: Secondary | ICD-10-CM | POA: Diagnosis present

## 2011-11-14 DIAGNOSIS — E871 Hypo-osmolality and hyponatremia: Principal | ICD-10-CM | POA: Diagnosis present

## 2011-11-14 DIAGNOSIS — R1110 Vomiting, unspecified: Secondary | ICD-10-CM | POA: Diagnosis not present

## 2011-11-14 DIAGNOSIS — E872 Acidosis, unspecified: Secondary | ICD-10-CM | POA: Diagnosis present

## 2011-11-14 DIAGNOSIS — R030 Elevated blood-pressure reading, without diagnosis of hypertension: Secondary | ICD-10-CM | POA: Diagnosis present

## 2011-11-14 DIAGNOSIS — E860 Dehydration: Secondary | ICD-10-CM

## 2011-11-14 DIAGNOSIS — Z87891 Personal history of nicotine dependence: Secondary | ICD-10-CM

## 2011-11-14 DIAGNOSIS — R42 Dizziness and giddiness: Secondary | ICD-10-CM | POA: Diagnosis present

## 2011-11-14 DIAGNOSIS — K5732 Diverticulitis of large intestine without perforation or abscess without bleeding: Secondary | ICD-10-CM | POA: Diagnosis present

## 2011-11-14 DIAGNOSIS — R404 Transient alteration of awareness: Secondary | ICD-10-CM | POA: Diagnosis present

## 2011-11-14 DIAGNOSIS — I671 Cerebral aneurysm, nonruptured: Secondary | ICD-10-CM | POA: Diagnosis present

## 2011-11-14 DIAGNOSIS — Z7982 Long term (current) use of aspirin: Secondary | ICD-10-CM

## 2011-11-14 DIAGNOSIS — R112 Nausea with vomiting, unspecified: Secondary | ICD-10-CM | POA: Diagnosis not present

## 2011-11-14 DIAGNOSIS — D638 Anemia in other chronic diseases classified elsewhere: Secondary | ICD-10-CM | POA: Diagnosis present

## 2011-11-14 DIAGNOSIS — K56609 Unspecified intestinal obstruction, unspecified as to partial versus complete obstruction: Secondary | ICD-10-CM | POA: Diagnosis not present

## 2011-11-14 DIAGNOSIS — IMO0001 Reserved for inherently not codable concepts without codable children: Secondary | ICD-10-CM | POA: Diagnosis not present

## 2011-11-14 DIAGNOSIS — F05 Delirium due to known physiological condition: Secondary | ICD-10-CM | POA: Diagnosis not present

## 2011-11-14 DIAGNOSIS — D649 Anemia, unspecified: Secondary | ICD-10-CM | POA: Diagnosis present

## 2011-11-14 DIAGNOSIS — J984 Other disorders of lung: Secondary | ICD-10-CM | POA: Diagnosis not present

## 2011-11-14 DIAGNOSIS — K5792 Diverticulitis of intestine, part unspecified, without perforation or abscess without bleeding: Secondary | ICD-10-CM

## 2011-11-14 DIAGNOSIS — R109 Unspecified abdominal pain: Secondary | ICD-10-CM | POA: Diagnosis not present

## 2011-11-14 DIAGNOSIS — R6889 Other general symptoms and signs: Secondary | ICD-10-CM | POA: Diagnosis not present

## 2011-11-14 HISTORY — DX: Unspecified osteoarthritis, unspecified site: M19.90

## 2011-11-14 HISTORY — DX: Diverticulitis of large intestine without perforation or abscess without bleeding: K57.32

## 2011-11-14 HISTORY — DX: Dizziness and giddiness: R42

## 2011-11-14 HISTORY — DX: Cerebral aneurysm, nonruptured: I67.1

## 2011-11-14 HISTORY — DX: Anemia, unspecified: D64.9

## 2011-11-14 HISTORY — DX: Hypo-osmolality and hyponatremia: E87.1

## 2011-11-14 HISTORY — DX: Unspecified intestinal obstruction, unspecified as to partial versus complete obstruction: K56.609

## 2011-11-14 HISTORY — DX: Other chronic pain: G89.29

## 2011-11-14 HISTORY — DX: Unspecified hearing loss, unspecified ear: H91.90

## 2011-11-14 HISTORY — DX: Dorsalgia, unspecified: M54.9

## 2011-11-14 LAB — MRSA PCR SCREENING: MRSA by PCR: NEGATIVE

## 2011-11-14 LAB — COMPREHENSIVE METABOLIC PANEL
ALT: 18 U/L (ref 0–53)
AST: 24 U/L (ref 0–37)
Albumin: 4.2 g/dL (ref 3.5–5.2)
CO2: 18 mEq/L — ABNORMAL LOW (ref 19–32)
Calcium: 9.8 mg/dL (ref 8.4–10.5)
Creatinine, Ser: 0.66 mg/dL (ref 0.50–1.35)
GFR calc non Af Amer: 84 mL/min — ABNORMAL LOW (ref >90)
Sodium: 118 mEq/L — CL (ref 135–145)

## 2011-11-14 LAB — CBC
Hemoglobin: 12.1 g/dL — ABNORMAL LOW (ref 13.0–17.0)
MCH: 32.6 pg (ref 26.0–34.0)
RBC: 3.71 MIL/uL — ABNORMAL LOW (ref 4.22–5.81)
WBC: 7.3 10*3/uL (ref 4.0–10.5)

## 2011-11-14 LAB — DIFFERENTIAL
Lymphocytes Relative: 17 % (ref 12–46)
Lymphs Abs: 1.2 10*3/uL (ref 0.7–4.0)
Monocytes Relative: 15 % — ABNORMAL HIGH (ref 3–12)
Neutro Abs: 4.9 10*3/uL (ref 1.7–7.7)
Neutrophils Relative %: 67 % (ref 43–77)

## 2011-11-14 LAB — LIPASE, BLOOD: Lipase: 18 U/L (ref 11–59)

## 2011-11-14 LAB — PROCALCITONIN: Procalcitonin: <0.1 ng/mL

## 2011-11-14 LAB — URINALYSIS, ROUTINE W REFLEX MICROSCOPIC
Bilirubin Urine: NEGATIVE
Glucose, UA: NEGATIVE mg/dL
Hgb urine dipstick: NEGATIVE
Specific Gravity, Urine: 1.01 (ref 1.005–1.030)
Urobilinogen, UA: 0.2 mg/dL (ref 0.0–1.0)
pH: 7 (ref 5.0–8.0)

## 2011-11-14 LAB — OSMOLALITY, URINE: Osmolality, Ur: 271 mOsm/kg — ABNORMAL LOW (ref 390–1090)

## 2011-11-14 LAB — SODIUM, URINE, RANDOM: Sodium, Ur: 52 mEq/L

## 2011-11-14 LAB — TSH: TSH: 4.576 u[IU]/mL — ABNORMAL HIGH (ref 0.350–4.500)

## 2011-11-14 LAB — URIC ACID: Uric Acid, Serum: 4.6 mg/dL (ref 4.0–7.8)

## 2011-11-14 LAB — LACTIC ACID, PLASMA: Lactic Acid, Venous: 1.4 mmol/L (ref 0.5–2.2)

## 2011-11-14 MED ORDER — ONDANSETRON HCL 4 MG/2ML IJ SOLN
4.0000 mg | Freq: Once | INTRAMUSCULAR | Status: AC
  Administered 2011-11-14: 4 mg via INTRAVENOUS
  Filled 2011-11-14: qty 2

## 2011-11-14 MED ORDER — HYDROCODONE-ACETAMINOPHEN 5-325 MG PO TABS
1.0000 | ORAL_TABLET | ORAL | Status: DC | PRN
  Administered 2011-11-14: 1 via ORAL
  Filled 2011-11-14: qty 1

## 2011-11-14 MED ORDER — SODIUM CHLORIDE 0.9 % IJ SOLN
INTRAMUSCULAR | Status: AC
  Administered 2011-11-14: 16:00:00
  Filled 2011-11-14: qty 3

## 2011-11-14 MED ORDER — SODIUM CHLORIDE 0.45 % IV SOLN
INTRAVENOUS | Status: DC
  Administered 2011-11-14 – 2011-11-15 (×2): via INTRAVENOUS
  Filled 2011-11-14 (×10): qty 1000

## 2011-11-14 MED ORDER — METRONIDAZOLE 500 MG PO TABS
500.0000 mg | ORAL_TABLET | Freq: Three times a day (TID) | ORAL | Status: DC
  Administered 2011-11-14 – 2011-11-15 (×3): 500 mg via ORAL
  Filled 2011-11-14 (×3): qty 1

## 2011-11-14 MED ORDER — LEVALBUTEROL HCL 0.63 MG/3ML IN NEBU: 0.6300 mg | INHALATION_SOLUTION | Freq: Four times a day (QID) | RESPIRATORY_TRACT | Status: DC | PRN

## 2011-11-14 MED ORDER — PROMETHAZINE HCL 25 MG/ML IJ SOLN
12.5000 mg | Freq: Once | INTRAMUSCULAR | Status: AC
  Administered 2011-11-14: 12.5 mg via INTRAVENOUS
  Filled 2011-11-14: qty 1

## 2011-11-14 MED ORDER — IOHEXOL 300 MG/ML  SOLN
100.0000 mL | Freq: Once | INTRAMUSCULAR | Status: AC | PRN
  Administered 2011-11-14: 100 mL via INTRAVENOUS

## 2011-11-14 MED ORDER — CIPROFLOXACIN HCL 250 MG PO TABS
500.0000 mg | ORAL_TABLET | Freq: Two times a day (BID) | ORAL | Status: DC
  Administered 2011-11-14 – 2011-11-15 (×2): 500 mg via ORAL
  Filled 2011-11-14 (×2): qty 2

## 2011-11-14 MED ORDER — ALUM & MAG HYDROXIDE-SIMETH 200-200-20 MG/5ML PO SUSP: 30.0000 mL | Freq: Four times a day (QID) | ORAL | Status: DC | PRN

## 2011-11-14 MED ORDER — MORPHINE SULFATE 2 MG/ML IJ SOLN
2.0000 mg | Freq: Once | INTRAMUSCULAR | Status: AC
  Administered 2011-11-14: 2 mg via INTRAVENOUS
  Filled 2011-11-14: qty 1

## 2011-11-14 MED ORDER — PANTOPRAZOLE SODIUM 40 MG IV SOLR
40.0000 mg | INTRAVENOUS | Status: DC
  Administered 2011-11-14 – 2011-11-15 (×2): 40 mg via INTRAVENOUS
  Filled 2011-11-14 (×2): qty 40

## 2011-11-14 MED ORDER — SODIUM CHLORIDE 0.9 % IV SOLN: INTRAVENOUS | Status: DC

## 2011-11-14 MED ORDER — MORPHINE SULFATE 2 MG/ML IJ SOLN
INTRAMUSCULAR | Status: AC
  Filled 2011-11-14: qty 1

## 2011-11-14 MED ORDER — MORPHINE SULFATE 2 MG/ML IJ SOLN
2.0000 mg | Freq: Once | INTRAMUSCULAR | Status: AC
  Administered 2011-11-14: 2 mg via INTRAVENOUS

## 2011-11-14 MED ORDER — ASPIRIN EC 81 MG PO TBEC
81.0000 mg | DELAYED_RELEASE_TABLET | Freq: Every day | ORAL | Status: DC
  Administered 2011-11-14 – 2011-11-16 (×3): 81 mg via ORAL
  Filled 2011-11-14 (×3): qty 1

## 2011-11-14 MED ORDER — ONDANSETRON HCL 4 MG/2ML IJ SOLN
4.0000 mg | Freq: Once | INTRAMUSCULAR | Status: AC
  Administered 2011-11-14: 4 mg via INTRAVENOUS

## 2011-11-14 MED ORDER — ENOXAPARIN SODIUM 40 MG/0.4ML ~~LOC~~ SOLN
40.0000 mg | SUBCUTANEOUS | Status: DC
  Administered 2011-11-14 – 2011-11-15 (×2): 40 mg via SUBCUTANEOUS
  Filled 2011-11-14 (×2): qty 0.4

## 2011-11-14 MED ORDER — SODIUM CHLORIDE 0.9 % IV BOLUS (SEPSIS)
500.0000 mL | Freq: Once | INTRAVENOUS | Status: AC
  Administered 2011-11-14: 500 mL via INTRAVENOUS

## 2011-11-14 MED ORDER — ACETAMINOPHEN 325 MG PO TABS: 650.0000 mg | ORAL_TABLET | Freq: Four times a day (QID) | ORAL | Status: DC | PRN

## 2011-11-14 MED ORDER — ONDANSETRON HCL 4 MG/2ML IJ SOLN
INTRAMUSCULAR | Status: AC
  Administered 2011-11-14: 4 mg
  Filled 2011-11-14: qty 2

## 2011-11-14 MED ORDER — MECLIZINE HCL 12.5 MG PO TABS
12.5000 mg | ORAL_TABLET | Freq: Two times a day (BID) | ORAL | Status: DC | PRN
  Administered 2011-11-14 – 2011-11-15 (×2): 12.5 mg via ORAL
  Filled 2011-11-14 (×2): qty 1

## 2011-11-14 MED ORDER — ONDANSETRON HCL 4 MG PO TABS: 4.0000 mg | ORAL_TABLET | Freq: Four times a day (QID) | ORAL | Status: DC | PRN

## 2011-11-14 MED ORDER — LIDOCAINE HCL (CARDIAC) 20 MG/ML IV SOLN
INTRAVENOUS | Status: AC
  Filled 2011-11-14: qty 5

## 2011-11-14 MED ORDER — ACETAMINOPHEN 650 MG RE SUPP: 650.0000 mg | Freq: Four times a day (QID) | RECTAL | Status: DC | PRN

## 2011-11-14 MED ORDER — SODIUM CHLORIDE 0.9 % IV SOLN
3.0000 g | Freq: Once | INTRAVENOUS | Status: AC
  Administered 2011-11-14: 3 g via INTRAVENOUS
  Filled 2011-11-14: qty 3

## 2011-11-14 MED ORDER — DOCUSATE SODIUM 100 MG PO CAPS
100.0000 mg | ORAL_CAPSULE | Freq: Two times a day (BID) | ORAL | Status: DC
  Administered 2011-11-14 – 2011-11-16 (×5): 100 mg via ORAL
  Filled 2011-11-14 (×5): qty 1

## 2011-11-14 MED ORDER — ONDANSETRON HCL 4 MG/2ML IJ SOLN
INTRAMUSCULAR | Status: AC
  Filled 2011-11-14: qty 2

## 2011-11-14 NOTE — ED Notes (Signed)
Patient stated that he needed to use restroom to son, went into room and gave patient a urinal. Attempted to help him use urinal, patient stated he couldn't use it because he was suffering so much.

## 2011-11-14 NOTE — Progress Notes (Signed)
Paged Dr. Sherrie Mustache with critical lab results of serum osmolality 249.  Awaiting response.

## 2011-11-14 NOTE — ED Notes (Signed)
Dr. Colon Branch notified that patient is stating his pain and nausea has not improved.

## 2011-11-14 NOTE — ED Provider Notes (Signed)
Admission arranged. Patient has not followed by Dr. Ouida Sills is never prolonged to him he is followed by [facility]. And daughter came in and relates a story of dizziness on and off for the past several days has been getting worse symptoms may be vertigo related. CT scan showed just mild diverticulitis Unasyn 3 g started for that patient with persistent vomiting attempted NG tube but was not successful patient fought too much. The patient Phenergan with the Phenergan his oxygen saturations dropped patient started on oxygen 4 L. Discussed with [facility] hospitalist they will mid to ICU for the severe hyponatremia. CT scan of head is ordered for the history of the vertigo or dizziness. Temporary admission orders completed.  Shelda Jakes, MD 11/14/11 [phone]

## 2011-11-14 NOTE — ED Notes (Signed)
Walked into patient's room to give him third dose of zofran as ordered by dr. Colon Branch, patient was sleeping with head down.

## 2011-11-14 NOTE — H&P (Addendum)
Larry Cox MRN: [id number] DOB/AGE: 11-15-1921 78 y.o. Primary Care Physician: Admit date: 11/14/2011 Chief Complaint: "I've been feeling bad". The patient complains of nausea, intermittent vomiting, and vomiting. He also complains of dizziness. HPI: The patient is an 78 year old man with a history significant for degenerative joint disease, chronic low back pain, hyponatremia, and small bowel obstruction. He presents to the emergency department today with a chief complaint of feeling bad, nausea, intermittent vomiting, and dizziness. He has been feeling bad intermittently since his discharge for treatment of small bowel obstruction in November of 2012. He has had progressively more nausea over the past week or 2, particularly over the past couple days. He has vomited several times, but he cannot quantify how many times. He denies coffee grounds emesis. He denies bright red blood in his emesis. He has had no diarrhea. He is more prone to constipation. He has some abdominal discomfort but he refuses to call it pain. He denies bloody stools or bright tarry stools. His dizziness is mostly an off balanced feeling rather than vertigo. He has had generalized weakness. He is independent according to his daughter Larry Cox, but over the past week, she has noticed that he has not been doing as much.   In the emergency department, he is noted to be afebrile and hemodynamically Cox. In his lab data are significant for a serum sodium of 118, chloride of 82, CO2 of 18, normal LFTs and lipase, normal creatinine of 0.66, and hemoglobin of 12.1. CT of his head is suggestive of an unruptured left MCA 6 mm aneurysm. CT of his abdomen and pelvis is suggestive of developing diverticulitis of the left colon and extensive diverticulosis. He is being admitted for further evaluation and management.  Past Medical History  Diagnosis Date  . Diverticulitis   . Small bowel obstruction   . Hearing deficit   . Arthritis   .  Chronic back pain     History reviewed. No pertinent past surgical history.  Prior to Admission medications   Medication Sig Start Date End Date Taking? Authorizing Provider  aspirin 325 MG tablet Take 325 mg by mouth daily.     Yes Historical Provider, MD  fish oil-omega-3 fatty acids 1000 MG capsule Take 1 g by mouth daily.     Yes Historical Provider, MD  meclizine (ANTIVERT) 25 MG tablet Take 12.5 mg by mouth 2 (two) times daily as needed. dizziness   Yes Historical Provider, MD  multivitamin-iron-minerals-folic acid (CENTRUM) chewable tablet Chew 1 tablet by mouth daily.     Yes Historical Provider, MD  naproxen (NAPROSYN) 375 MG tablet Take 375 mg by mouth 2 (two) times daily as needed.   Yes Historical Provider, MD  omeprazole (PRILOSEC) 20 MG capsule Take 20 mg by mouth daily.     Yes Historical Provider, MD  polyethylene glycol powder (GLYCOLAX/MIRALAX) powder Take 17 g by mouth daily.   Yes Historical Provider, MD    Allergies:  Allergies  Allergen Reactions  . Lorazepam Other (See Comments)    Hallucinations, Violent Behavior    No family history on file.  Social History: He is widowed. He has 3 children. He is retired. He lives independently in [place]. His son, Larry Cox lives next 1. He has a very remote history of smoking cigars. He drinks beer on occasion. He is independent. He most lawn, he still drives, etc.        ROS: Positive for arthritic pain in his lower back and as above in  the history of present illness. Otherwise, review of systems is negative.  PHYSICAL EXAM: Blood pressure 101/78, pulse 98, temperature 98.1 F (36.7 C), temperature source Oral, resp. rate 19, height 5\' 8"  (1.727 m), weight 73.4 kg (161 lb 13.1 oz), SpO2 96.00%. General: Elderly 78 year old Caucasian man sitting up in bed, in no acute distress. He is requesting something to eat. HEENT: Head is normocephalic, nontraumatic. Pupils are equal, round, and reactive to light. Extraocular was  are intact. Oropharynx reveals mildly dry mucous membranes. No posterior exudates or erythema. Neck: Supple, no adenopathy, no thyromegaly, no JVD. Lungs: Decreased breath sounds in the bases, otherwise clear. Heart: S1, S2, with a 1-2/6 systolic murmur. Abdomen: Positive bowel sounds, soft, mildly tender at the left lower quadrant, without rebound, no masses palpated. Extremities: Multiple lower extremity varicosities. No pedal edema. Pedal pulses palpable. Neurologic: He is alert and oriented x3. He is very hard of hearing. Otherwise, cranial nerves II through XII are intact. Strength is globally 5 minus over 5. No focal strength deficit.   Basic Metabolic Panel:  Basename 11/14/11 0337  NA 118*  K 3.8  CL 82*  CO2 18*  GLUCOSE 147*  BUN 8  CREATININE 0.66  CALCIUM 9.8  MG --  PHOS --   Liver Function Tests:  [facility] 11/14/11 0337  AST 24  ALT 18  ALKPHOS 51  BILITOT 0.5  PROT 6.7  ALBUMIN 4.2    Basename 11/14/11 0530  LIPASE 18  AMYLASE --   No results found for this basename: AMMONIA:2 in the last 72 hours CBC:  Basename 11/14/11 0337  WBC 7.3  NEUTROABS 4.9  HGB 12.1*  HCT 32.9*  MCV 88.7  PLT 250   Cardiac Enzymes: No results found for this basename: CKTOTAL:3,CKMB:3,CKMBINDEX:3,TROPONINI:3 in the last 72 hours BNP: No results found for this basename: PROBNP:3 in the last 72 hours D-Dimer: No results found for this basename: DDIMER:2 in the last 72 hours CBG: No results found for this basename: GLUCAP:6 in the last 72 hours Hemoglobin A1C: No results found for this basename: HGBA1C in the last 72 hours Fasting Lipid Panel: No results found for this basename: CHOL,HDL,LDLCALC,TRIG,CHOLHDL,LDLDIRECT in the last 72 hours Thyroid Function Tests: No results found for this basename: TSH,T4TOTAL,FREET4,T3FREE,THYROIDAB in the last 72 hours Anemia Panel: No results found for this basename: VITAMINB12,FOLATE,FERRITIN,TIBC,IRON,RETICCTPCT in the last 72  hours Coagulation: No results found for this basename: LABPROT:2,INR:2 in the last 72 hours Urine Drug Screen: Drugs of Abuse  No results found for this basename: labopia, cocainscrnur, labbenz, amphetmu, thcu, labbarb    Alcohol Level: No results found for this basename: ETH:2 in the last 72 hours Urinalysis:  Basename 11/14/11 0529  COLORURINE YELLOW  LABSPEC 1.010  PHURINE 7.0  GLUCOSEU NEGATIVE  HGBUR NEGATIVE  BILIRUBINUR NEGATIVE  KETONESUR 15*  PROTEINUR NEGATIVE  UROBILINOGEN 0.2  NITRITE NEGATIVE  LEUKOCYTESUR NEGATIVE   Misc. Labs:   EKG:  Recent Results (from the past 240 hour(s))  MRSA PCR SCREENING     Status: Normal   Collection Time   11/14/11 10:41 AM      Component Value Range Status Comment   MRSA by PCR NEGATIVE  NEGATIVE  Final      Results for orders placed during the hospital encounter of 11/14/11 (from the past 48 hour(s))  CBC     Status: Abnormal   Collection Time   11/14/11  3:37 AM      Component Value Range Comment   WBC 7.3  4.0 -  10.5 (K/uL)    RBC 3.71 (*) 4.22 - 5.81 (MIL/uL)    Hemoglobin 12.1 (*) 13.0 - 17.0 (g/dL)    HCT 16.1 (*) 09.6 - 52.0 (%)    MCV 88.7  78.0 - 100.0 (fL)    MCH 32.6  26.0 - 34.0 (pg)    MCHC 36.8 (*) 30.0 - 36.0 (g/dL)    RDW 04.5  40.9 - 81.1 (%)    Platelets 250  150 - 400 (K/uL)   DIFFERENTIAL     Status: Abnormal   Collection Time   11/14/11  3:37 AM      Component Value Range Comment   Neutrophils Relative 67  43 - 77 (%)    Neutro Abs 4.9  1.7 - 7.7 (K/uL)    Lymphocytes Relative 17  12 - 46 (%)    Lymphs Abs 1.2  0.7 - 4.0 (K/uL)    Monocytes Relative 15 (*) 3 - 12 (%)    Monocytes Absolute 1.1 (*) 0.1 - 1.0 (K/uL)    Eosinophils Relative 1  0 - 5 (%)    Eosinophils Absolute 0.1  0.0 - 0.7 (K/uL)    Basophils Relative 0  0 - 1 (%)    Basophils Absolute 0.0  0.0 - 0.1 (K/uL)   COMPREHENSIVE METABOLIC PANEL     Status: Abnormal   Collection Time   11/14/11  3:37 AM      Component Value Range  Comment   Sodium 118 (*) 135 - 145 (mEq/L)    Potassium 3.8  3.5 - 5.1 (mEq/L)    Chloride 82 (*) 96 - 112 (mEq/L)    CO2 18 (*) 19 - 32 (mEq/L)    Glucose, Bld 147 (*) 70 - 99 (mg/dL)    BUN 8  6 - 23 (mg/dL)    Creatinine, Ser 9.14  0.50 - 1.35 (mg/dL)    Calcium 9.8  8.4 - 10.5 (mg/dL)    Total Protein 6.7  6.0 - 8.3 (g/dL)    Albumin 4.2  3.5 - 5.2 (g/dL)    AST 24  0 - 37 (U/L)    ALT 18  0 - 53 (U/L)    Alkaline Phosphatase 51  39 - 117 (U/L)    Total Bilirubin 0.5  0.3 - 1.2 (mg/dL)    GFR calc non Af Amer 84 (*) >90 (mL/min)    GFR calc Af Amer >90  >90 (mL/min)   URINALYSIS, ROUTINE W REFLEX MICROSCOPIC     Status: Abnormal   Collection Time   11/14/11  5:29 AM      Component Value Range Comment   Color, Urine YELLOW  YELLOW     APPearance CLEAR  CLEAR     Specific Gravity, Urine 1.010  1.005 - 1.030     pH 7.0  5.0 - 8.0     Glucose, UA NEGATIVE  NEGATIVE (mg/dL)    Hgb urine dipstick NEGATIVE  NEGATIVE     Bilirubin Urine NEGATIVE  NEGATIVE     Ketones, ur 15 (*) NEGATIVE (mg/dL)    Protein, ur NEGATIVE  NEGATIVE (mg/dL)    Urobilinogen, UA 0.2  0.0 - 1.0 (mg/dL)    Nitrite NEGATIVE  NEGATIVE     Leukocytes, UA NEGATIVE  NEGATIVE  MICROSCOPIC NOT DONE ON URINES WITH NEGATIVE PROTEIN, BLOOD, LEUKOCYTES, NITRITE, OR GLUCOSE <1000 mg/dL.  LACTIC ACID, PLASMA     Status: Normal   Collection Time   11/14/11  5:30 AM  Component Value Range Comment   Lactic Acid, Venous 1.4  0.5 - 2.2 (mmol/L)   PROCALCITONIN     Status: Normal   Collection Time   11/14/11  5:30 AM      Component Value Range Comment   Procalcitonin <0.10     LIPASE, BLOOD     Status: Normal   Collection Time   11/14/11  5:30 AM      Component Value Range Comment   Lipase 18  11 - 59 (U/L)   MRSA PCR SCREENING     Status: Normal   Collection Time   11/14/11 10:41 AM      Component Value Range Comment   MRSA by PCR NEGATIVE  NEGATIVE    SODIUM, URINE, RANDOM     Status: Normal   Collection Time     11/14/11 11:58 AM      Component Value Range Comment   Sodium, Ur 52     URIC ACID     Status: Normal   Collection Time   11/14/11 12:00 PM      Component Value Range Comment   Uric Acid, Serum 4.6  4.0 - 7.8 (mg/dL)     Ct Head Wo Contrast  11/14/2011  *RADIOLOGY REPORT*  Clinical Data: 78 year old male with altered level of consciousness.  CT HEAD WITHOUT CONTRAST  Technique:  Contiguous axial images were obtained from the base of the skull through the vertex without contrast.  Comparison: None.  Findings: Visualized paranasal sinuses and mastoids are clear.  No acute orbit or scalp soft tissue findings. No acute osseous abnormality identified.  Calcified atherosclerosis at the skull base.  Dural calcifications. 6 mm rounded vascular density mass at the left sylvian fissure / MCA branching.  No acute intracranial hemorrhage identified.  No ventriculomegaly. No evidence of cortically based acute infarction identified.  Normal gray-white matter differentiation.  IMPRESSION: 1.  Suspect unruptured left MCA 6 mm aneurysm.  CTA or MRA of the head would confirm. 2. Otherwise negative for age noncontrast CT appearance of the brain.  These results will be called to the ordering clinician or representative by the Radiologist Assistant, and communication documented in the PACS Dashboard.  Original Report Authenticated By: Harley Hallmark, M.D.   Ct Abdomen Pelvis W Contrast  11/14/2011  *RADIOLOGY REPORT*  Clinical Data: 78 year old male with lower abdominal pain nausea and vomiting.  The patient declined oral contrast.  History of small bowel obstruction, diverticulitis.  CT ABDOMEN AND PELVIS WITH CONTRAST  Technique:  Multidetector CT imaging of the abdomen and pelvis was performed following the standard protocol during bolus administration of intravenous contrast.  Contrast: OMNIPAQUE IOHEXOL 300 MG/ML  SOLN 05/09/2011.  Comparison: 05/09/2011.  Findings: Cox streaky and linear opacity at the lung  bases, greater on the left.  Legrand Rams this represents scarring.  Cox cardiomegaly.  No pericardial or pleural effusion.  Scoliosis and degenerative changes in the spine.  Advanced spinal disc degeneration. No acute osseous abnormality identified.  Cox fat containing left inguinal hernia.  Mildly distended bladder similar to the prior study.  Extensive descending and sigmoid colon diverticulosis.  Diverticula continue into the proximal rectum.  There is an indistinctness of the colon wall throughout these segments now when compared to the prior exam.  No confluent mesenteric stranding.  No pelvic or left pericolic gutter free fluid.  No extraluminal gas.  Mild motion artifact elsewhere in the abdomen.  No definite abnormality of the more proximal colon.  No dilated small  bowel. Small volume of oral contrast in the stomach.  Probable duodenal diverticulum re-identified.  Cox liver low density areas which likely are benign cysts. Negative gallbladder, spleen, pancreas and adrenal glands allowing for some motion artifact.  Cox kidneys.  Portal venous system is within normal limits.  Aortic atherosclerosis and ectasia is Cox.  No lymphadenopathy.  IMPRESSION: 1.  Mild/developing diverticulitis of the left colon.  Extensive diverticulosis from the splenic flexure to the rectum. 2.  No free fluid, abscess, or evidence of perforation. 3.  Otherwise Cox abdomen pelvis as detailed above.  Original Report Authenticated By: Harley Hallmark, M.D.   Dg Abd Acute W/chest  11/14/2011  *RADIOLOGY REPORT*  Clinical Data: Abdominal pain and vomiting  ACUTE ABDOMEN SERIES (ABDOMEN 2 VIEW & CHEST 1 VIEW)  Comparison: Chest 05/09/2011, abdomen 05/12/2011  Findings: Slightly shallow inspiration.  Emphysematous changes and scattered fibrosis in the lungs.  Scarring in the left lung base. No focal consolidation.  No blunting of costophrenic angles.  No pneumothorax.  Cox appearance since previous study.  Paucity of gas  in the abdomen.  A few scattered gas and stool collections noted in the colon.  No small or large bowel distension.  No free intra-abdominal air.  No abnormal air fluid levels.  Degenerative changes and scoliosis of the lumbar spine. Calcifications in the pelvis consistent with phleboliths.  Small calcification projected over the right kidney could represent a renal stone.  This was obscured by bowel gas on the previous study. Calcified phleboliths in the pelvis.  IMPRESSION: Emphysematous changes, fibrosis, and scarring in the lungs.  No evidence of active pulmonary disease.  Nonobstructive bowel gas pattern.  Calcification projected over the right kidney may represent a renal stone.  Original Report Authenticated By: Marlon Pel, M.D.    Impression:  Principal Problem:  *Hyponatremia Active Problems:  Nausea & vomiting  Metabolic acidosis  Diverticulitis of colon  Anemia  Dizziness    1. Severe hyponatremia. This is a recurrence. In November 2012, his hyponatremia was attributable to dehydration and vomiting. With IV fluids, it corrected to 131 prior to discharge. He now presents with intermittent nausea and vomiting for more than one week. His vomiting has resolved according to him. I suspect that he has chronic hyponatremia that worsens with any type of GI consult.  2. Early diverticulitis. He received Unasyn in the emergency department.  3. Metabolic acidosis. This is likely secondary to dehydration/volume depletion. His renal function is within normal limits. He denies diarrhea.  4. Dizziness, likely secondary to severe hyponatremia.  5. Mild normocytic anemia.  6. Questionable left MCA subcentimeter aneurysm seen on CT.  Plan:  1. The patient received Unasyn and normal saline in the emergency department. 2. We'll start Cipro and Flagyl orally, now since his vomiting has resolved. 3. We'll continue IV fluid hydration with half-normal saline with bicarbonate added. 4. Will  order additional sodium indices to assess the etiology of severe hyponatremia. 5. Will order MRA of his brain to assess whether or not he has a true aneurysm. 6. Supportive treatment.   Critical Care time: 1 hour 15 minutes.   Salma Walrond 11/14/2011, 1:54 PM

## 2011-11-14 NOTE — Progress Notes (Signed)
Received call from Radiology with results of head CT (unruptured Left MCA 6mm Aneurysm; CTA or MRA of head recommended to confirm).  Paged Dr. Sherrie Mustache with results and she returned call and she will address when she comes to the floor to assess patient.

## 2011-11-14 NOTE — ED Notes (Signed)
Notified Dr. Colon Branch that patient has only been able to tolerate approximately 30 ccs of oral ct contrast and that patient stated he cannot take it anymore. Dr. Colon Branch stated to go ahead and scan patient without oral contrast. Ct tech notified.

## 2011-11-14 NOTE — ED Notes (Signed)
Crystal from lab called and stated that sodium level on patient was 118.

## 2011-11-14 NOTE — ED Notes (Signed)
Patient attempting to drink ct oral contrast at this time. Tolerating small sips slowly as instructed. Patient is belching some and spitting up mucous.

## 2011-11-14 NOTE — ED Notes (Signed)
Reported by EMS pt has hx of SBO. Pt states been nausea for awhile & vomiting. Pt keeps saying he needs something for nausea.

## 2011-11-14 NOTE — ED Notes (Signed)
Dr. Colon Branch stated to not insert nasogastric tube until patient has his ct scan. Asked her if she wanted patient to drink oral ct contrast. Stated to have patient try to drink contrast for scan. Bambi, ct tech notified.

## 2011-11-14 NOTE — ED Provider Notes (Signed)
History     CSN: [id number]  Arrival date & time 11/14/11  4098   First MD Initiated Contact with Patient 11/14/11 0329      Chief Complaint  Patient presents with  . Nausea  . Emesis    (Consider location/radiation/quality/duration/timing/severity/associated sxs/prior treatment) HPI Larry Cox is a 78 y.o. male brought in by ambulance, who presents to the Emergency Department complaining of  Abdominal pain and nausea that began yesterday and gotten progressively worse. Patient has a h/o diverticulitis, SBO. Pain is on the left side of his abdomen and he states it is severe. He has taken no medicines. He had a similar presentation in November of last year requiring several days in the hospital. He had an ileus vs early SBO which resolved with conservative treatment.    PCP Dr. Ouida Sills   Past Medical History  Diagnosis Date  . Diverticulitis   . Small bowel obstruction   . Hearing deficit     History reviewed. No pertinent past surgical history.  No family history on file.  History  Substance Use Topics  . Smoking status: Former Games developer  . Smokeless tobacco: Not on file  . Alcohol Use: 1.2 oz/week    2 Cans of beer per week     per day      Review of Systems  Constitutional: Negative for fever.       10 Systems reviewed and are negative for acute change except as noted in the HPI.  HENT: Negative for congestion.   Eyes: Negative for discharge and redness.  Respiratory: Negative for cough and shortness of breath.   Cardiovascular: Negative for chest pain.  Gastrointestinal: Positive for nausea, vomiting and abdominal pain.  Musculoskeletal: Negative for back pain.  Skin: Negative for rash.  Neurological: Negative for syncope, numbness and headaches.  Psychiatric/Behavioral:       No behavior change.    Allergies  Lorazepam  Home Medications   Current Outpatient Rx  Name Route Sig Dispense Refill  . ASPIRIN 325 MG PO TABS Oral Take 325 mg by mouth  daily.      . OMEGA-3 FATTY ACIDS 1000 MG PO CAPS Oral Take 1 g by mouth daily.      . CENTRUM PO CHEW Oral Chew 1 tablet by mouth daily.      Marland Kitchen NAPROXEN 375 MG PO TABS Oral Take 375 mg by mouth 2 (two) times daily as needed.    Marland Kitchen OMEPRAZOLE 20 MG PO CPDR Oral Take 20 mg by mouth daily.      Marland Kitchen POLYETHYLENE GLYCOL 3350 PO POWD Oral Take 17 g by mouth daily.      BP 129/63  Pulse 73  Temp(Src) 98.1 F (36.7 C) (Oral)  Resp 44  Ht 5\' 8"  (1.727 m)  Wt 166 lb (75.297 kg)  BMI 25.24 kg/m2  SpO2 99%  Physical Exam  Nursing note and vitals reviewed. Constitutional: He is oriented to person, place, and time.       Awake, alert, uncomfortable. HOH  HENT:  Head: Atraumatic.  Right Ear: External ear normal.  Left Ear: External ear normal.  Eyes: EOM are normal. Pupils are equal, round, and reactive to light. Right eye exhibits no discharge. Left eye exhibits no discharge.  Neck: Neck supple.  Cardiovascular: Normal rate, normal heart sounds and intact distal pulses.   Pulmonary/Chest: Effort normal and breath sounds normal. He exhibits no tenderness.  Abdominal: Soft. Bowel sounds are normal. There is tenderness. There is no rebound  and no guarding.       Tenderness to lower left abdomen.  Musculoskeletal: He exhibits no tenderness.       Baseline ROM, no obvious new focal weakness.  Neurological: He is alert and oriented to person, place, and time.       Mental status and motor strength appears baseline for patient and situation.  Skin: No rash noted.  Psychiatric: He has a normal mood and affect.    ED Course  Procedures (including critical care time)  Results for orders placed during the hospital encounter of 11/14/11  CBC      Component Value Range   WBC 7.3  4.0 - 10.5 (K/uL)   RBC 3.71 (*) 4.22 - 5.81 (MIL/uL)   Hemoglobin 12.1 (*) 13.0 - 17.0 (g/dL)   HCT 40.9 (*) 81.1 - 52.0 (%)   MCV 88.7  78.0 - 100.0 (fL)   MCH 32.6  26.0 - 34.0 (pg)   MCHC 36.8 (*) 30.0 - 36.0  (g/dL)   RDW 91.4  78.2 - 95.6 (%)   Platelets 250  150 - 400 (K/uL)  DIFFERENTIAL      Component Value Range   Neutrophils Relative 67  43 - 77 (%)   Neutro Abs 4.9  1.7 - 7.7 (K/uL)   Lymphocytes Relative 17  12 - 46 (%)   Lymphs Abs 1.2  0.7 - 4.0 (K/uL)   Monocytes Relative 15 (*) 3 - 12 (%)   Monocytes Absolute 1.1 (*) 0.1 - 1.0 (K/uL)   Eosinophils Relative 1  0 - 5 (%)   Eosinophils Absolute 0.1  0.0 - 0.7 (K/uL)   Basophils Relative 0  0 - 1 (%)   Basophils Absolute 0.0  0.0 - 0.1 (K/uL)  COMPREHENSIVE METABOLIC PANEL      Component Value Range   Sodium 118 (*) 135 - 145 (mEq/L)   Potassium 3.8  3.5 - 5.1 (mEq/L)   Chloride 82 (*) 96 - 112 (mEq/L)   CO2 18 (*) 19 - 32 (mEq/L)   Glucose, Bld 147 (*) 70 - 99 (mg/dL)   BUN 8  6 - 23 (mg/dL)   Creatinine, Ser 2.13  0.50 - 1.35 (mg/dL)   Calcium 9.8  8.4 - 08.6 (mg/dL)   Total Protein 6.7  6.0 - 8.3 (g/dL)   Albumin 4.2  3.5 - 5.2 (g/dL)   AST 24  0 - 37 (U/L)   ALT 18  0 - 53 (U/L)   Alkaline Phosphatase 51  39 - 117 (U/L)   Total Bilirubin 0.5  0.3 - 1.2 (mg/dL)   GFR calc non Af Amer 84 (*) >90 (mL/min)   GFR calc Af Amer >90  >90 (mL/min)  URINALYSIS, ROUTINE W REFLEX MICROSCOPIC      Component Value Range   Color, Urine YELLOW  YELLOW    APPearance CLEAR  CLEAR    Specific Gravity, Urine 1.010  1.005 - 1.030    pH 7.0  5.0 - 8.0    Glucose, UA NEGATIVE  NEGATIVE (mg/dL)   Hgb urine dipstick NEGATIVE  NEGATIVE    Bilirubin Urine NEGATIVE  NEGATIVE    Ketones, ur 15 (*) NEGATIVE (mg/dL)   Protein, ur NEGATIVE  NEGATIVE (mg/dL)   Urobilinogen, UA 0.2  0.0 - 1.0 (mg/dL)   Nitrite NEGATIVE  NEGATIVE    Leukocytes, UA NEGATIVE  NEGATIVE   LACTIC ACID, PLASMA      Component Value Range   Lactic Acid, Venous 1.4  0.5 -  2.2 (mmol/L)  PROCALCITONIN      Component Value Range   Procalcitonin <0.10    LIPASE, BLOOD      Component Value Range   Lipase 18  11 - 59 (U/L)   Dg Abd Acute W/chest  11/14/2011   *RADIOLOGY REPORT*  Clinical Data: Abdominal pain and vomiting  ACUTE ABDOMEN SERIES (ABDOMEN 2 VIEW & CHEST 1 VIEW)  Comparison: Chest 05/09/2011, abdomen 05/12/2011  Findings: Slightly shallow inspiration.  Emphysematous changes and scattered fibrosis in the lungs.  Scarring in the left lung base. No focal consolidation.  No blunting of costophrenic angles.  No pneumothorax.  Stable appearance since previous study.  Paucity of gas in the abdomen.  A few scattered gas and stool collections noted in the colon.  No small or large bowel distension.  No free intra-abdominal air.  No abnormal air fluid levels.  Degenerative changes and scoliosis of the lumbar spine. Calcifications in the pelvis consistent with phleboliths.  Small calcification projected over the right kidney could represent a renal stone.  This was obscured by bowel gas on the previous study. Calcified phleboliths in the pelvis.  IMPRESSION: Emphysematous changes, fibrosis, and scarring in the lungs.  No evidence of active pulmonary disease.  Nonobstructive bowel gas pattern.  Calcification projected over the right kidney may represent a renal stone.  Original Report Authenticated By: Marlon Pel, M.D.   [phone] Continues to c/o pain and nausea. Additional medicines ordered. 0525 Continued nausea. Spitting, not vomiting. Additional antiemetic ordered. 0630 He is unable to drink the contrast. Has only had 30 cc. Will proceed with CT. Once he has returned. NG to be placed.  MDM  Elderly patient who presents with a lower abdominal pain associated with nausea. Presentation is similar to that of November 2012 which required admission for several days. At that time the patient had an ileus. Acute abdominal series does not show anything specific.labs are remarkable for hyponatremia 118 with a CO2 of 18. Lactic acid and calcitonin are normal, as is lipase.   MDM Reviewed: previous chart, nursing note and vitals Reviewed previous: labs, CT scan  and x-ray Interpretation: labs, x-ray and CT scan           Nicoletta Dress. Colon Branch, MD 11/18/11 [phone]

## 2011-11-14 NOTE — ED Notes (Signed)
Dr. Colon Branch in room to see patient at this time.

## 2011-11-15 DIAGNOSIS — K5732 Diverticulitis of large intestine without perforation or abscess without bleeding: Secondary | ICD-10-CM

## 2011-11-15 DIAGNOSIS — R42 Dizziness and giddiness: Secondary | ICD-10-CM

## 2011-11-15 DIAGNOSIS — F05 Delirium due to known physiological condition: Secondary | ICD-10-CM | POA: Diagnosis not present

## 2011-11-15 DIAGNOSIS — R112 Nausea with vomiting, unspecified: Secondary | ICD-10-CM

## 2011-11-15 DIAGNOSIS — E871 Hypo-osmolality and hyponatremia: Secondary | ICD-10-CM

## 2011-11-15 LAB — BASIC METABOLIC PANEL
BUN: 6 mg/dL (ref 6–23)
CO2: 25 mEq/L (ref 19–32)
Chloride: 90 mEq/L — ABNORMAL LOW (ref 96–112)
GFR calc Af Amer: >90 mL/min (ref >90)
Potassium: 3.9 mEq/L (ref 3.5–5.1)

## 2011-11-15 LAB — CBC
HCT: 29.2 % — ABNORMAL LOW (ref 39.0–52.0)
Hemoglobin: 10.3 g/dL — ABNORMAL LOW (ref 13.0–17.0)
MCV: 89.3 fL (ref 78.0–100.0)
WBC: 5 10*3/uL (ref 4.0–10.5)

## 2011-11-15 MED ORDER — CIPROFLOXACIN HCL 250 MG PO TABS
250.0000 mg | ORAL_TABLET | Freq: Two times a day (BID) | ORAL | Status: DC
  Administered 2011-11-15 – 2011-11-16 (×2): 250 mg via ORAL
  Filled 2011-11-15 (×2): qty 1

## 2011-11-15 MED ORDER — POLYETHYLENE GLYCOL 3350 17 G PO PACK
17.0000 g | PACK | ORAL | Status: DC
  Administered 2011-11-15: 17 g via ORAL

## 2011-11-15 MED ORDER — SODIUM CHLORIDE 1 G PO TABS
1.0000 g | ORAL_TABLET | Freq: Two times a day (BID) | ORAL | Status: DC
  Administered 2011-11-15 – 2011-11-16 (×3): 1 g via ORAL
  Filled 2011-11-15 (×5): qty 1

## 2011-11-15 MED ORDER — POTASSIUM CHLORIDE IN NACL 20-0.9 MEQ/L-% IV SOLN
INTRAVENOUS | Status: DC
  Administered 2011-11-15: 10:00:00 via INTRAVENOUS

## 2011-11-15 MED ORDER — METRONIDAZOLE 500 MG PO TABS
250.0000 mg | ORAL_TABLET | Freq: Three times a day (TID) | ORAL | Status: DC
  Administered 2011-11-15 – 2011-11-16 (×3): 250 mg via ORAL
  Filled 2011-11-15 (×2): qty 1

## 2011-11-15 NOTE — Progress Notes (Signed)
Report given to Soledad Gerlach, RN on Dept 300.  Pt is A&O, VSS; daughter is at bedside with pt.  Pt will transfer to room 328 via wheelchair.

## 2011-11-15 NOTE — Progress Notes (Signed)
Alarm sounding and pt sitting on side of bed with daughter at bedside assistingto use urinal. Pt voided and then staff reoriented to time.  Assisted  Back to bed and reapplied monitor leads. Did attempt to get bp but pt very restless and stated it was too tight. Pt stated he was going back to sleep and that all he needed was something to eat. Bed alarm reset and will continue to monitor.

## 2011-11-15 NOTE — Progress Notes (Signed)
Rechecked BP 140/88 manual.  Patient states he feels some relief from dizziness.  Will notify MD of BP and continue to monitor.  Schonewitz, Candelaria Stagers 11/15/2011

## 2011-11-15 NOTE — Progress Notes (Signed)
Pt very restless and agitated. Trying to get oob without assistance. Daughter at bedside and bed alarm on. pulling off monitor wires and bp cuff. Attempting to pull out iv. assisited back to bed  But pt refusing to put bp cuff back on.  Pt is oriented to time, place and person but not situation. Stated he was fine.pt wants door closed but explained the4 shades would need to be up in order to keep him safe.

## 2011-11-15 NOTE — Progress Notes (Signed)
Subjective: The patient became confused overnight. He says he was all of these wires off of him. He is currently alert and oriented. He denies abdominal pain, nausea, or vomiting. He wants to eat solid food.  Objective: Vital signs in last 24 hours: Filed Vitals:   11/15/11 0500 11/15/11 0600 11/15/11 0651 11/15/11 0800  BP:    120/55  Pulse:    73  Temp:    98 F (36.7 C)  TempSrc:    Oral  Resp: 14 15 16    Height:      Weight:   74.7 kg (164 lb 10.9 oz)   SpO2:    95%    Intake/Output Summary (Last 24 hours) at 11/15/11 1610 Last data filed at 11/15/11 0700  Gross per 24 hour  Intake   2670 ml  Output   1550 ml  Net   1120 ml    Weight change: -1.897 kg (-4 lb 2.9 oz)  Physical exam: General: Pleasant hard of hearing 78 year old Caucasian man, sitting up in bed, in no acute distress. Lungs: Decreased breath sounds in the bases, otherwise clear. Heart: S1, S2, with no murmurs rubs or gallops. Abdomen: Positive bowel sounds, soft, nontender, nondistended. Extremities: No pedal edema. Neurologic: She is alert and oriented to himself, his daughter, and hospital. His speech is clear. He is hard of hearing. He follows directions well.  Lab Results: Basic Metabolic Panel:  Basename 11/15/11 0437 11/14/11 0337  NA 122* 118*  K 3.9 3.8  CL 90* 82*  CO2 25 18*  GLUCOSE 101* 147*  BUN 6 8  CREATININE 0.68 0.66  CALCIUM 8.4 9.8  MG -- --  PHOS -- --   Liver Function Tests:  [facility] 11/14/11 0337  AST 24  ALT 18  ALKPHOS 51  BILITOT 0.5  PROT 6.7  ALBUMIN 4.2    Basename 11/14/11 0530  LIPASE 18  AMYLASE --   No results found for this basename: AMMONIA:2 in the last 72 hours CBC:  Basename 11/15/11 0437 11/14/11 0337  WBC 5.0 7.3  NEUTROABS -- 4.9  HGB 10.3* 12.1*  HCT 29.2* 32.9*  MCV 89.3 88.7  PLT 212 250   Cardiac Enzymes: No results found for this basename: CKTOTAL:3,CKMB:3,CKMBINDEX:3,TROPONINI:3 in the last 72 hours BNP: No results found  for this basename: PROBNP:3 in the last 72 hours D-Dimer: No results found for this basename: DDIMER:2 in the last 72 hours CBG: No results found for this basename: GLUCAP:6 in the last 72 hours Hemoglobin A1C: No results found for this basename: HGBA1C in the last 72 hours Fasting Lipid Panel: No results found for this basename: CHOL,HDL,LDLCALC,TRIG,CHOLHDL,LDLDIRECT in the last 72 hours Thyroid Function Tests:  Basename 11/14/11 1200  TSH 4.576*  T4TOTAL --  FREET4 0.95  T3FREE --  THYROIDAB --   Anemia Panel: No results found for this basename: VITAMINB12,FOLATE,FERRITIN,TIBC,IRON,RETICCTPCT in the last 72 hours Coagulation: No results found for this basename: LABPROT:2,INR:2 in the last 72 hours Urine Drug Screen: Drugs of Abuse  No results found for this basename: labopia,  cocainscrnur,  labbenz,  amphetmu,  thcu,  labbarb    Alcohol Level: No results found for this basename: ETH:2 in the last 72 hours Urinalysis:  Basename 11/14/11 0529  COLORURINE YELLOW  LABSPEC 1.010  PHURINE 7.0  GLUCOSEU NEGATIVE  HGBUR NEGATIVE  BILIRUBINUR NEGATIVE  KETONESUR 15*  PROTEINUR NEGATIVE  UROBILINOGEN 0.2  NITRITE NEGATIVE  LEUKOCYTESUR NEGATIVE   Misc. Labs:   Micro: Recent Results (from the past 240  hour(s))  MRSA PCR SCREENING     Status: Normal   Collection Time   11/14/11 10:41 AM      Component Value Range Status Comment   MRSA by PCR NEGATIVE  NEGATIVE  Final     Studies/Results: Ct Head Wo Contrast  11/14/2011  *RADIOLOGY REPORT*  Clinical Data: 78 year old male with altered level of consciousness.  CT HEAD WITHOUT CONTRAST  Technique:  Contiguous axial images were obtained from the base of the skull through the vertex without contrast.  Comparison: None.  Findings: Visualized paranasal sinuses and mastoids are clear.  No acute orbit or scalp soft tissue findings. No acute osseous abnormality identified.  Calcified atherosclerosis at the skull base.  Dural  calcifications. 6 mm rounded vascular density mass at the left sylvian fissure / MCA branching.  No acute intracranial hemorrhage identified.  No ventriculomegaly. No evidence of cortically based acute infarction identified.  Normal gray-white matter differentiation.  IMPRESSION: 1.  Suspect unruptured left MCA 6 mm aneurysm.  CTA or MRA of the head would confirm. 2. Otherwise negative for age noncontrast CT appearance of the brain.  These results will be called to the ordering clinician or representative by the Radiologist Assistant, and communication documented in the PACS Dashboard.  Original Report Authenticated By: Harley Hallmark, M.D.   Ct Abdomen Pelvis W Contrast  11/14/2011  *RADIOLOGY REPORT*  Clinical Data: 78 year old male with lower abdominal pain nausea and vomiting.  The patient declined oral contrast.  History of small bowel obstruction, diverticulitis.  CT ABDOMEN AND PELVIS WITH CONTRAST  Technique:  Multidetector CT imaging of the abdomen and pelvis was performed following the standard protocol during bolus administration of intravenous contrast.  Contrast: OMNIPAQUE IOHEXOL 300 MG/ML  SOLN 05/09/2011.  Comparison: 05/09/2011.  Findings: Stable streaky and linear opacity at the lung bases, greater on the left.  Legrand Rams this represents scarring.  Stable cardiomegaly.  No pericardial or pleural effusion.  Scoliosis and degenerative changes in the spine.  Advanced spinal disc degeneration. No acute osseous abnormality identified.  Stable fat containing left inguinal hernia.  Mildly distended bladder similar to the prior study.  Extensive descending and sigmoid colon diverticulosis.  Diverticula continue into the proximal rectum.  There is an indistinctness of the colon wall throughout these segments now when compared to the prior exam.  No confluent mesenteric stranding.  No pelvic or left pericolic gutter free fluid.  No extraluminal gas.  Mild motion artifact elsewhere in the abdomen.  No  definite abnormality of the more proximal colon.  No dilated small bowel. Small volume of oral contrast in the stomach.  Probable duodenal diverticulum re-identified.  Stable liver low density areas which likely are benign cysts. Negative gallbladder, spleen, pancreas and adrenal glands allowing for some motion artifact.  Stable kidneys.  Portal venous system is within normal limits.  Aortic atherosclerosis and ectasia is stable.  No lymphadenopathy.  IMPRESSION: 1.  Mild/developing diverticulitis of the left colon.  Extensive diverticulosis from the splenic flexure to the rectum. 2.  No free fluid, abscess, or evidence of perforation. 3.  Otherwise stable abdomen pelvis as detailed above.  Original Report Authenticated By: Harley Hallmark, M.D.   Dg Abd Acute W/chest  11/14/2011  *RADIOLOGY REPORT*  Clinical Data: Abdominal pain and vomiting  ACUTE ABDOMEN SERIES (ABDOMEN 2 VIEW & CHEST 1 VIEW)  Comparison: Chest 05/09/2011, abdomen 05/12/2011  Findings: Slightly shallow inspiration.  Emphysematous changes and scattered fibrosis in the lungs.  Scarring in the left  lung base. No focal consolidation.  No blunting of costophrenic angles.  No pneumothorax.  Stable appearance since previous study.  Paucity of gas in the abdomen.  A few scattered gas and stool collections noted in the colon.  No small or large bowel distension.  No free intra-abdominal air.  No abnormal air fluid levels.  Degenerative changes and scoliosis of the lumbar spine. Calcifications in the pelvis consistent with phleboliths.  Small calcification projected over the right kidney could represent a renal stone.  This was obscured by bowel gas on the previous study. Calcified phleboliths in the pelvis.  IMPRESSION: Emphysematous changes, fibrosis, and scarring in the lungs.  No evidence of active pulmonary disease.  Nonobstructive bowel gas pattern.  Calcification projected over the right kidney may represent a renal stone.  Original Report  Authenticated By: Marlon Pel, M.D.    Medications:  Scheduled:   . ampicillin-sulbactam (UNASYN) IV  3 g Intravenous Once  . aspirin EC  81 mg Oral Daily  . ciprofloxacin  250 mg Oral BID  . docusate sodium  100 mg Oral BID  . enoxaparin  40 mg Subcutaneous Q24H  . lidocaine (cardiac) 100 mg/75ml      . metroNIDAZOLE  250 mg Oral Q8H  . pantoprazole (PROTONIX) IV  40 mg Intravenous Q24H  . sodium chloride      . sodium chloride  1 g Oral BID WC  . DISCONTD: sodium chloride   Intravenous STAT  . DISCONTD: ciprofloxacin  500 mg Oral BID  . DISCONTD: metroNIDAZOLE  500 mg Oral Q8H   Continuous:   . 0.9 % NaCl with KCl 20 mEq / L    . DISCONTD: sodium chloride 0.45 % 1,000 mL with potassium chloride 20 mEq, sodium bicarbonate 50 mEq infusion 100 mL/hr at 11/15/11 0747   WUJ:[id number], acetaminophen, alum & mag hydroxide-simeth, HYDROcodone-acetaminophen, levalbuterol, meclizine, ondansetron  Assessment: Principal Problem:  *Hyponatremia Active Problems:  Nausea & vomiting  Metabolic acidosis  Diverticulitis of colon  Anemia  Dizziness  Elevated BP  Sundowning   1. Hyponatremia, likely secondary to hypovolemia and possibly salt wasting. His TSH is slightly elevated but his free T4 is within normal limits. His blood cortisol level is within normal limits. His uric acid is within normal limits. Urine and sodium osmolality is noted. His serum sodium is improving on saline.  Metabolic acidosis. Now resolved status post gentle bicarbonate.  Early diverticulitis. Cipro and Flagyl have been started.  Elevated blood pressure. Nursing staff states that the blood pressure recorded earlier with a technical mistake.  Anemia. Likely chronic. No further workup at this time.  Delirium with sundowning. He was redirected appropriately.  Query cerebral aneurysm. MRA of the brain results pending.  Plan:  1. We'll advance his diet to a low residue diet. We'll liberalize salt  intake. We'll order 1 g of sodium chloride twice a day. 2. We'll discontinue bicarbonate in the IV fluids. We'll continue normal saline. 3. We'll check the results of the MRI of his brain pending. 4. We'll discontinue telemetry and transfer him to a regular bed, preferably in a room with a camera. A bed alarm will be added. 5. Continue monitoring his sodium levels daily.    LOS: 1 day   Harshal Sirmon 11/15/2011, 8:29 AM

## 2011-11-15 NOTE — Progress Notes (Signed)
Pt oob without assistance. Bed alarm sounding. Pulling off monitor leads. very agitated. Tried several times to obtain bp but pt continues pull at cuff and take it off. Unable to get a correct bp

## 2011-11-15 NOTE — Progress Notes (Signed)
Called to room by daughter stating patient was feeling dizzy.  BP 170/112.  MD notified.  Antivert given for dizziness as ordered.  MD ordered to recheck manual BP in 30 minutes and text results to her pager.  Will continue to monitor BP and notify MD as ordered.  Schonewitz, Candelaria Stagers 11/15/2011

## 2011-11-16 ENCOUNTER — Encounter (HOSPITAL_COMMUNITY): Payer: Self-pay | Admitting: Internal Medicine

## 2011-11-16 DIAGNOSIS — E871 Hypo-osmolality and hyponatremia: Secondary | ICD-10-CM

## 2011-11-16 DIAGNOSIS — R42 Dizziness and giddiness: Secondary | ICD-10-CM | POA: Diagnosis not present

## 2011-11-16 DIAGNOSIS — I671 Cerebral aneurysm, nonruptured: Secondary | ICD-10-CM

## 2011-11-16 DIAGNOSIS — K5732 Diverticulitis of large intestine without perforation or abscess without bleeding: Secondary | ICD-10-CM

## 2011-11-16 HISTORY — DX: Cerebral aneurysm, nonruptured: I67.1

## 2011-11-16 LAB — BASIC METABOLIC PANEL
BUN: 6 mg/dL (ref 6–23)
CO2: 24 mEq/L (ref 19–32)
Calcium: 9.7 mg/dL (ref 8.4–10.5)
Chloride: 98 mEq/L (ref 96–112)
Creatinine, Ser: 0.71 mg/dL (ref 0.50–1.35)
GFR calc Af Amer: >90 mL/min (ref >90)
GFR calc non Af Amer: 81 mL/min — ABNORMAL LOW (ref >90)
Glucose, Bld: 93 mg/dL (ref 70–99)
Potassium: 4.3 mEq/L (ref 3.5–5.1)
Sodium: 132 mEq/L — ABNORMAL LOW (ref 135–145)

## 2011-11-16 MED ORDER — CIPROFLOXACIN HCL 250 MG PO TABS: 250.0000 mg | ORAL_TABLET | Freq: Two times a day (BID) | ORAL | Status: AC

## 2011-11-16 MED ORDER — PANTOPRAZOLE SODIUM 40 MG PO TBEC: 40.0000 mg | DELAYED_RELEASE_TABLET | Freq: Every day | ORAL | Status: DC

## 2011-11-16 MED ORDER — POLYETHYLENE GLYCOL 3350 17 GM/SCOOP PO POWD: 17.0000 g | Freq: Every day | ORAL | Status: DC | PRN

## 2011-11-16 MED ORDER — METRONIDAZOLE 250 MG PO TABS: 250.0000 mg | ORAL_TABLET | Freq: Three times a day (TID) | ORAL | Status: AC

## 2011-11-16 NOTE — Discharge Instructions (Addendum)
Also, let your doctor know that you have a small 6 mm aneurysm in your brain.

## 2011-11-16 NOTE — Progress Notes (Signed)
The patient is receiving Protonix by the intravenous route.  Based on criteria approved by the Pharmacy and Therapeutics Committee and the Medical Executive Committee, the medication is being converted to the equivalent oral dose form.  These criteria include: -No Active GI bleeding -Able to tolerate diet of full liquids (or better) or tube feeding -Able to tolerate other medications by the oral or enteral route  If you have any questions about this conversion, please contact the Pharmacy Department (ext [phone]).  Thank you.  Larry Cox [place], [facility] 11/16/2011 9:51 AM

## 2011-11-16 NOTE — Progress Notes (Signed)
Pt. Discharged home via private vehicle, driven by Daughter. Pt currently voices no c/o pain or discomfort. Discharge instructions reviewed with pt. & family with understanding.

## 2011-11-16 NOTE — Discharge Summary (Signed)
Physician Discharge Summary  Larry Cox MRN: [id number] DOB/AGE: [birth year] 78 y.o.  PCP: [facility], Ninfa Linden, NP   Admit date: 11/14/2011 Discharge date: 11/16/2011  Discharge Diagnoses:  1. Severe hyponatremia, recurrent. The patient's serum sodium was 118 on admission and 132 at the time of discharge. Suspected etiology included volume depletion in the setting of history of polydipsia with water. 2. Mild developing diverticulitis of the left colon. 3. Nausea and vomiting, possibly secondary to severe hyponatremia versus diverticulitis. 4. Metabolic acidosis, resolved. 5. Incidental finding of 6 mm left MCA aneurysm. 6. Chronic anemia. 7. Mild sundowning. 8. Chronic dizziness/vertigo. 9. Episodic elevated blood pressure.    Medication List  As of 11/16/2011 10:12 AM   TAKE these medications         aspirin 325 MG tablet   Take 325 mg by mouth daily.      ciprofloxacin 250 MG tablet   Commonly known as: CIPRO   Take 1 tablet (250 mg total) by mouth 2 (two) times daily. For diverticulitis      fish oil-omega-3 fatty acids 1000 MG capsule   Take 1 g by mouth daily.      meclizine 25 MG tablet   Commonly known as: ANTIVERT   Take 12.5 mg by mouth 2 (two) times daily as needed. dizziness      metroNIDAZOLE 250 MG tablet   Commonly known as: FLAGYL   Take 1 tablet (250 mg total) by mouth 3 (three) times daily. For diverticulitis      multivitamin-iron-minerals-folic acid chewable tablet   Chew 1 tablet by mouth daily.      naproxen 375 MG tablet   Commonly known as: NAPROSYN   Take 375 mg by mouth 2 (two) times daily as needed.      omeprazole 20 MG capsule   Commonly known as: PRILOSEC   Take 20 mg by mouth daily.      polyethylene glycol powder powder   Commonly known as: GLYCOLAX/MIRALAX   Take 17 g by mouth daily as needed. Take as needed for constipation.            Discharge Condition: Improved.  Disposition: 01-Home or  Self Care   Consults: None.   Significant Diagnostic Studies: Ct Head Wo Contrast  11/14/2011  *RADIOLOGY REPORT*  Clinical Data: 78 year old male with altered level of consciousness.  CT HEAD WITHOUT CONTRAST  Technique:  Contiguous axial images were obtained from the base of the skull through the vertex without contrast.  Comparison: None.  Findings: Visualized paranasal sinuses and mastoids are clear.  No acute orbit or scalp soft tissue findings. No acute osseous abnormality identified.  Calcified atherosclerosis at the skull base.  Dural calcifications. 6 mm rounded vascular density mass at the left sylvian fissure / MCA branching.  No acute intracranial hemorrhage identified.  No ventriculomegaly. No evidence of cortically based acute infarction identified.  Normal gray-white matter differentiation.  IMPRESSION: 1.  Suspect unruptured left MCA 6 mm aneurysm.  CTA or MRA of the head would confirm. 2. Otherwise negative for age noncontrast CT appearance of the brain.  These results will be called to the ordering clinician or representative by the Radiologist Assistant, and communication documented in the PACS Dashboard.  Original Report Authenticated By: Harley Hallmark, M.D.   Mr Maxine Glenn Head Wo Contrast  11/15/2011  *RADIOLOGY REPORT*  Clinical Data: Rule out aneurysm.  Abnormal CT head  MRA HEAD WITHOUT CONTRAST  Technique: Angiographic images of the  Circle of Willis were obtained using MRA technique without intravenous contrast.  Comparison: CT 11/14/2011  Findings: 6 mm aneurysm of the left middle cerebral artery bifurcation.  Left middle cerebral artery branches are patent.  No other aneurysm is identified.  Left vertebral artery is patent to the basilar.  Basilar is widely patent.  Right vertebral artery ends in pica which is a normal variant.  Fetal origin of the posterior cerebral artery bilaterally.  Hypoplastic right P1 segment.  Atherosclerotic type irregularity of the superior cerebellar and  posterior cerebral arteries bilaterally which are patent.  Anterior and middle cerebral arteries are patent bilaterally without significant stenosis.  Internal carotid artery is patent with mild atherosclerotic disease.  IMPRESSION: 6 mm aneurysm left middle cerebral artery bifurcation.  Mild intracranial atherosclerotic disease without large vessel occlusion.  Original Report Authenticated By: Camelia Phenes, M.D.   Ct Abdomen Pelvis W Contrast  11/14/2011  *RADIOLOGY REPORT*  Clinical Data: 78 year old male with lower abdominal pain nausea and vomiting.  The patient declined oral contrast.  History of small bowel obstruction, diverticulitis.  CT ABDOMEN AND PELVIS WITH CONTRAST  Technique:  Multidetector CT imaging of the abdomen and pelvis was performed following the standard protocol during bolus administration of intravenous contrast.  Contrast: OMNIPAQUE IOHEXOL 300 MG/ML  SOLN 05/09/2011.  Comparison: 05/09/2011.  Findings: Stable streaky and linear opacity at the lung bases, greater on the left.  Legrand Rams this represents scarring.  Stable cardiomegaly.  No pericardial or pleural effusion.  Scoliosis and degenerative changes in the spine.  Advanced spinal disc degeneration. No acute osseous abnormality identified.  Stable fat containing left inguinal hernia.  Mildly distended bladder similar to the prior study.  Extensive descending and sigmoid colon diverticulosis.  Diverticula continue into the proximal rectum.  There is an indistinctness of the colon wall throughout these segments now when compared to the prior exam.  No confluent mesenteric stranding.  No pelvic or left pericolic gutter free fluid.  No extraluminal gas.  Mild motion artifact elsewhere in the abdomen.  No definite abnormality of the more proximal colon.  No dilated small bowel. Small volume of oral contrast in the stomach.  Probable duodenal diverticulum re-identified.  Stable liver low density areas which likely are benign cysts.  Negative gallbladder, spleen, pancreas and adrenal glands allowing for some motion artifact.  Stable kidneys.  Portal venous system is within normal limits.  Aortic atherosclerosis and ectasia is stable.  No lymphadenopathy.  IMPRESSION: 1.  Mild/developing diverticulitis of the left colon.  Extensive diverticulosis from the splenic flexure to the rectum. 2.  No free fluid, abscess, or evidence of perforation. 3.  Otherwise stable abdomen pelvis as detailed above.  Original Report Authenticated By: Harley Hallmark, M.D.   Dg Abd Acute W/chest  11/14/2011  *RADIOLOGY REPORT*  Clinical Data: Abdominal pain and vomiting  ACUTE ABDOMEN SERIES (ABDOMEN 2 VIEW & CHEST 1 VIEW)  Comparison: Chest 05/09/2011, abdomen 05/12/2011  Findings: Slightly shallow inspiration.  Emphysematous changes and scattered fibrosis in the lungs.  Scarring in the left lung base. No focal consolidation.  No blunting of costophrenic angles.  No pneumothorax.  Stable appearance since previous study.  Paucity of gas in the abdomen.  A few scattered gas and stool collections noted in the colon.  No small or large bowel distension.  No free intra-abdominal air.  No abnormal air fluid levels.  Degenerative changes and scoliosis of the lumbar spine. Calcifications in the pelvis consistent with phleboliths.  Small calcification  projected over the right kidney could represent a renal stone.  This was obscured by bowel gas on the previous study. Calcified phleboliths in the pelvis.  IMPRESSION: Emphysematous changes, fibrosis, and scarring in the lungs.  No evidence of active pulmonary disease.  Nonobstructive bowel gas pattern.  Calcification projected over the right kidney may represent a renal stone.  Original Report Authenticated By: Marlon Pel, M.D.    Microbiology: Recent Results (from the past 240 hour(s))  MRSA PCR SCREENING     Status: Normal   Collection Time   11/14/11 10:41 AM      Component Value Range Status Comment   MRSA  by PCR NEGATIVE  NEGATIVE  Final      Labs: Results for orders placed during the hospital encounter of 11/14/11 (from the past 48 hour(s))  MRSA PCR SCREENING     Status: Normal   Collection Time   11/14/11 10:41 AM      Component Value Range Comment   MRSA by PCR NEGATIVE  NEGATIVE    OSMOLALITY, URINE     Status: Abnormal   Collection Time   11/14/11 11:58 AM      Component Value Range Comment   Osmolality, Ur 271 (*) 390 - 1090 (mOsm/kg)   SODIUM, URINE, RANDOM     Status: Normal   Collection Time   11/14/11 11:58 AM      Component Value Range Comment   Sodium, Ur 52     OSMOLALITY     Status: Abnormal   Collection Time   11/14/11 12:00 PM      Component Value Range Comment   Osmolality 249 (*) 275 - 300 (mOsm/kg) Result repeated and verified.  URIC ACID     Status: Normal   Collection Time   11/14/11 12:00 PM      Component Value Range Comment   Uric Acid, Serum 4.6  4.0 - 7.8 (mg/dL)   CORTISOL     Status: Normal   Collection Time   11/14/11 12:00 PM      Component Value Range Comment   Cortisol, Plasma 14.8     T4, FREE     Status: Normal   Collection Time   11/14/11 12:00 PM      Component Value Range Comment   Free T4 0.95  0.80 - 1.80 (ng/dL)   TSH     Status: Abnormal   Collection Time   11/14/11 12:00 PM      Component Value Range Comment   TSH 4.576 (*) 0.350 - 4.500 (uIU/mL)   BASIC METABOLIC PANEL     Status: Abnormal   Collection Time   11/15/11  4:37 AM      Component Value Range Comment   Sodium 122 (*) 135 - 145 (mEq/L)    Potassium 3.9  3.5 - 5.1 (mEq/L)    Chloride 90 (*) 96 - 112 (mEq/L) DELTA CHECK NOTED   CO2 25  19 - 32 (mEq/L)    Glucose, Bld 101 (*) 70 - 99 (mg/dL)    BUN 6  6 - 23 (mg/dL)    Creatinine, Ser 4.09  0.50 - 1.35 (mg/dL)    Calcium 8.4  8.4 - 10.5 (mg/dL)    GFR calc non Af Amer 83 (*) >90 (mL/min)    GFR calc Af Amer >90  >90 (mL/min)   CBC     Status: Abnormal   Collection Time   11/15/11  4:37 AM      Component Value  Range  Comment   WBC 5.0  4.0 - 10.5 (K/uL)    RBC 3.27 (*) 4.22 - 5.81 (MIL/uL)    Hemoglobin 10.3 (*) 13.0 - 17.0 (g/dL)    HCT 16.1 (*) 09.6 - 52.0 (%)    MCV 89.3  78.0 - 100.0 (fL)    MCH 31.5  26.0 - 34.0 (pg)    MCHC 35.3  30.0 - 36.0 (g/dL)    RDW 04.5  40.9 - 81.1 (%)    Platelets 212  150 - 400 (K/uL)   BASIC METABOLIC PANEL     Status: Abnormal   Collection Time   11/16/11  5:00 AM      Component Value Range Comment   Sodium 132 (*) 135 - 145 (mEq/L) DELTA CHECK NOTED   Potassium 4.3  3.5 - 5.1 (mEq/L)    Chloride 98  96 - 112 (mEq/L) DELTA CHECK NOTED   CO2 24  19 - 32 (mEq/L)    Glucose, Bld 93  70 - 99 (mg/dL)    BUN 6  6 - 23 (mg/dL)    Creatinine, Ser 9.14  0.50 - 1.35 (mg/dL)    Calcium 9.7  8.4 - 10.5 (mg/dL)    GFR calc non Af Amer 81 (*) >90 (mL/min)    GFR calc Af Amer >90  >90 (mL/min)      HPI : The patient is an 78 year old man with a history significant for degenerative joint disease, chronic low back pain, hyponatremia, and small bowel obstruction. He presented to the emergency department on Nov 14, 2011, with a chief complaint of feeling bad, intermittent nausea and vomiting, and dizziness. In the emergency department, he was afebrile and hemodynamically stable. His lab data were significant for a serum sodium of 118, chloride of 82, CO2 of 18, normal LFTs, normal lipase, normal creatinine, and a hemoglobin of 12.1. CT scan of his head was suggestive of an unruptured left MCA 6 mm aneurysm. CT scan of his abdomen and pelvis was suggestive of developing diverticulitis of the left colon and extensive diverticulosis. He was admitted for further evaluation and management.  HOSPITAL COURSE: The patient was started on normal saline for hydration and treatment of hyponatremia. He was given Unasyn in the emergency department for the CT scan findings suggestive of developing diverticulitis. The patient presented with nausea and vomiting as well as dizziness, but the  symptomatology was likely secondary to severe hyponatremia more than developing diverticulitis. Nevertheless, the patient was continued on treatment for diverticulitis with Cipro and Flagyl. Later, IV fluids were changed to half-normal saline with bicarbonate added to treat his mild metabolic acidosis. His nausea and vomiting was treated with as needed antiemetics. His dizziness was treated with as needed meclizine. For further evaluation, a number of studies were ordered. His lactic acid level was within normal limits at 1.4. His serum osmolality was low at 249. His cortisol level was within normal limits at 14.8. His TSH was slightly elevated at 4.5 but his free T4 was within normal limits at 0.95. His urine osmolality was low at 271. His urine sodium was 52. His uric acid was within normal limits at 4.6. The MRA of his brain confirmed the small 6 mm left MCA aneurysm.  When the patient improved clinically, his diet was advanced with liberal sodium added. He was prescribed 1 g of sodium chloride twice a day. His serum sodium improved progressively. The patient improved clinically and symptomatically. He had no further nausea, vomiting, or sustained dizziness.  And in my discussion with the patient and his daughter, apparently, the patient had been drinking 8 or more bottles of water daily and an occasional beer or 2 daily. He thought that he was doing well was recommended in drinking 8 cups of water daily. I informed them that it was likely that he was drinking too much water which contributed to his sodium level being too low.  I instructed him to decrease the amount of water by approximately half. He was also instructed to drink no more than one beer daily. I suggested Gatorade once or twice daily if he needs something else to drink. I also instructed him to not follow a low salt diet and to at salt liberally to his foods. Both he and his daughter voiced understanding. The patient was discharged on Cipro and  Flagyl for 7 more days. He had no complaints of nausea and, vomiting, or diarrhea at the time of discharge.    Discharge Exam: Blood pressure 137/85, pulse 78, temperature 97.9 F (36.6 C), temperature source Oral, resp. rate 20, height 5\' 8"  (1.727 m), weight 74.254 kg (163 lb 11.2 oz), SpO2 94.00%. Lungs: Clear to auscultation bilaterally. Heart: S1, S2, with a soft systolic murmur. Next line abdomen: Positive bowel sounds, soft, nontender, nondistended. Extremities: No pedal edema.    Discharge Orders    Future Orders Please Complete By Expires   Diet general      Increase activity slowly      Discharge instructions      Comments:   DRINK NO MORE THAN 4 BOTTLES OF WATER DAILY. DRINK NO MORE THAN 1 BOTTLE OF GATORADE OR OTHER BEVERAGE LIKE T.  DAILY. LIMIT BEER TO 1 DAILY. ADD A TOTAL OF 1/2 TO 1 TEASPOON OF SALT TO ALL OF YOUR MEAL AND SNACKS. YOUR DR. WILL NEED TO CHECK YOUR BLOOD SODIUM LEVEL AT LEAST ONCE MONTHLY. INFORM YOUR DOCTOR AT THAT YOUR BLOOD SODIUM LEVEL WAS 118 ON ADMISSION AND 132 AT THE TIME OF DISCHARGE. YOU'RE ALSO BEING TREATED FOR MILD DIVERTICULITIS WITH CIPRO AND METRONIDAZOLE.       Follow-up Information    Follow up with Ninfa Linden, NP. Schedule an appointment as soon as possible for a visit in 1 week.   Contact information:   [phone] [place] [address] [phone]          Total discharge time: 40 minutes.  Signed: Latisia Hilaire 11/16/2011, 10:12 AM

## 2011-11-27 DIAGNOSIS — Z79899 Other long term (current) drug therapy: Secondary | ICD-10-CM | POA: Diagnosis not present

## 2011-11-27 DIAGNOSIS — E871 Hypo-osmolality and hyponatremia: Secondary | ICD-10-CM | POA: Diagnosis not present

## 2011-11-27 DIAGNOSIS — R413 Other amnesia: Secondary | ICD-10-CM | POA: Diagnosis not present

## 2011-12-05 DIAGNOSIS — R112 Nausea with vomiting, unspecified: Secondary | ICD-10-CM | POA: Diagnosis not present

## 2011-12-05 DIAGNOSIS — E871 Hypo-osmolality and hyponatremia: Secondary | ICD-10-CM | POA: Diagnosis not present

## 2011-12-25 DIAGNOSIS — E871 Hypo-osmolality and hyponatremia: Secondary | ICD-10-CM | POA: Diagnosis not present

## 2012-01-02 DIAGNOSIS — S40269A Insect bite (nonvenomous) of unspecified shoulder, initial encounter: Secondary | ICD-10-CM | POA: Diagnosis not present

## 2012-01-02 DIAGNOSIS — L089 Local infection of the skin and subcutaneous tissue, unspecified: Secondary | ICD-10-CM | POA: Diagnosis not present

## 2012-03-23 ENCOUNTER — Emergency Department (HOSPITAL_COMMUNITY): Payer: Medicare Other

## 2012-03-23 ENCOUNTER — Emergency Department (HOSPITAL_COMMUNITY)
Admission: EM | Admit: 2012-03-23 | Discharge: 2012-03-23 | Disposition: A | Discharge status: Home / Self Care | Payer: Medicare Other | Attending: Emergency Medicine | Admitting: Emergency Medicine

## 2012-03-23 ENCOUNTER — Encounter (HOSPITAL_COMMUNITY): Payer: Self-pay | Admitting: Emergency Medicine

## 2012-03-23 DIAGNOSIS — K5730 Diverticulosis of large intestine without perforation or abscess without bleeding: Secondary | ICD-10-CM | POA: Insufficient documentation

## 2012-03-23 DIAGNOSIS — R197 Diarrhea, unspecified: Secondary | ICD-10-CM | POA: Diagnosis not present

## 2012-03-23 DIAGNOSIS — R110 Nausea: Secondary | ICD-10-CM

## 2012-03-23 DIAGNOSIS — R10814 Left lower quadrant abdominal tenderness: Secondary | ICD-10-CM | POA: Insufficient documentation

## 2012-03-23 DIAGNOSIS — R109 Unspecified abdominal pain: Secondary | ICD-10-CM

## 2012-03-23 DIAGNOSIS — R112 Nausea with vomiting, unspecified: Secondary | ICD-10-CM | POA: Diagnosis not present

## 2012-03-23 LAB — CBC WITH DIFFERENTIAL/PLATELET
Basophils Absolute: 0 10*3/uL (ref 0.0–0.1)
Basophils Relative: 0 % (ref 0–1)
Eosinophils Absolute: 0.1 10*3/uL (ref 0.0–0.7)
Eosinophils Relative: 1 % (ref 0–5)
HCT: 37.9 % — ABNORMAL LOW (ref 39.0–52.0)
Hemoglobin: 13.6 g/dL (ref 13.0–17.0)
Lymphocytes Relative: 17 % (ref 12–46)
Lymphs Abs: 1.9 10*3/uL (ref 0.7–4.0)
MCH: 32.4 pg (ref 26.0–34.0)
MCHC: 35.9 g/dL (ref 30.0–36.0)
MCV: 90.2 fL (ref 78.0–100.0)
Monocytes Absolute: 1.1 10*3/uL — ABNORMAL HIGH (ref 0.1–1.0)
Monocytes Relative: 10 % (ref 3–12)
Neutro Abs: 8.5 10*3/uL — ABNORMAL HIGH (ref 1.7–7.7)
Neutrophils Relative %: 73 % (ref 43–77)
Platelets: 365 10*3/uL (ref 150–400)
RBC: 4.2 MIL/uL — ABNORMAL LOW (ref 4.22–5.81)
RDW: 12.8 % (ref 11.5–15.5)
WBC: 11.6 10*3/uL — ABNORMAL HIGH (ref 4.0–10.5)

## 2012-03-23 LAB — BASIC METABOLIC PANEL
BUN: 8 mg/dL (ref 6–23)
CO2: 20 mEq/L (ref 19–32)
Calcium: 10.3 mg/dL (ref 8.4–10.5)
Chloride: 89 mEq/L — ABNORMAL LOW (ref 96–112)
Creatinine, Ser: 0.75 mg/dL (ref 0.50–1.35)
GFR calc Af Amer: >90 mL/min (ref >90)
GFR calc non Af Amer: 80 mL/min — ABNORMAL LOW (ref >90)
Glucose, Bld: 107 mg/dL — ABNORMAL HIGH (ref 70–99)
Potassium: 3.7 mEq/L (ref 3.5–5.1)
Sodium: 128 mEq/L — ABNORMAL LOW (ref 135–145)

## 2012-03-23 LAB — URINALYSIS, ROUTINE W REFLEX MICROSCOPIC
Bilirubin Urine: NEGATIVE
Glucose, UA: NEGATIVE mg/dL
Hgb urine dipstick: NEGATIVE
Ketones, ur: 15 mg/dL — AB
Leukocytes, UA: NEGATIVE
Nitrite: NEGATIVE
Protein, ur: NEGATIVE mg/dL
Specific Gravity, Urine: 1.018 (ref 1.005–1.030)
Urobilinogen, UA: 0.2 mg/dL (ref 0.0–1.0)
pH: 6.5 (ref 5.0–8.0)

## 2012-03-23 LAB — OCCULT BLOOD, POC DEVICE: Fecal Occult Bld: NEGATIVE

## 2012-03-23 LAB — LACTIC ACID, PLASMA: Lactic Acid, Venous: 2.6 mmol/L — ABNORMAL HIGH (ref 0.5–2.2)

## 2012-03-23 MED ORDER — ONDANSETRON HCL 4 MG/2ML IJ SOLN
4.0000 mg | Freq: Once | INTRAMUSCULAR | Status: AC
  Administered 2012-03-23: 4 mg via INTRAVENOUS
  Filled 2012-03-23: qty 2

## 2012-03-23 MED ORDER — HYDROMORPHONE HCL PF 1 MG/ML IJ SOLN
1.0000 mg | Freq: Once | INTRAMUSCULAR | Status: AC
  Administered 2012-03-23: 1 mg via INTRAVENOUS
  Filled 2012-03-23: qty 1

## 2012-03-23 MED ORDER — ONDANSETRON 4 MG PO TBDP: ORAL_TABLET | ORAL | Status: DC

## 2012-03-23 MED ORDER — SODIUM CHLORIDE 0.9 % IV SOLN
INTRAVENOUS | Status: DC
  Administered 2012-03-23: 20:00:00 via INTRAVENOUS

## 2012-03-23 MED ORDER — HYDROCODONE-ACETAMINOPHEN 5-500 MG PO TABS: 1.0000 | ORAL_TABLET | Freq: Four times a day (QID) | ORAL | Status: DC | PRN

## 2012-03-23 MED ORDER — SODIUM CHLORIDE 0.9 % IV BOLUS (SEPSIS)
500.0000 mL | Freq: Once | INTRAVENOUS | Status: AC
  Administered 2012-03-23: 500 mL via INTRAVENOUS

## 2012-03-23 MED ORDER — IOHEXOL 300 MG/ML  SOLN
100.0000 mL | Freq: Once | INTRAMUSCULAR | Status: AC | PRN
  Administered 2012-03-23: 100 mL via INTRAVENOUS

## 2012-03-23 NOTE — ED Notes (Signed)
Pt. Reports abdominal pain for several months but became worse this AM at 0500. Reports hx of diverticulitis. Pt. Reports abdominal pain in lower left quadrant, tender to palpation. Pt. Also reports diarrhea and nausea.

## 2012-03-23 NOTE — ED Notes (Signed)
Pt. Signed discharge, computer froze and will not let this nurse in to access e-signature.

## 2012-03-23 NOTE — ED Notes (Signed)
Pt c/o abd pain with nausea and rectal pain; pt sts seen for same in [facility]; pt sts has growth in rectum

## 2012-03-23 NOTE — ED Notes (Signed)
Pt. Stable, no distress noted. Family with pt.

## 2012-03-23 NOTE — ED Notes (Signed)
Pt. Reports dizziness upon standing when using urinal, SPO2 dropped to low 80s initially and came back up to 100%. Pt. vomited after. Currently resting in bed.

## 2012-03-23 NOTE — ED Provider Notes (Signed)
History     CSN: [id number]  Arrival date & time 03/23/12  1803   First MD Initiated Contact with Patient 03/23/12 1932      Chief Complaint  Patient presents with  . Abdominal Pain  . Nausea  . Rectal Pain    (Consider location/radiation/quality/duration/timing/severity/associated sxs/prior treatment) Patient is a 78 y.o. male presenting with abdominal pain. The history is provided by the patient and a relative. No language interpreter was used.  Abdominal Pain The primary symptoms of the illness include abdominal pain, nausea and diarrhea. The primary symptoms of the illness do not include fever. The current episode started 13 to 24 hours ago. The onset of the illness was gradual. The problem has been gradually worsening.  The abdominal pain began 13 to24 hours ago. The pain came on gradually. The abdominal pain has been gradually worsening since its onset. The abdominal pain is located in the LLQ. The abdominal pain does not radiate. The severity of the abdominal pain is 5/10. The abdominal pain is relieved by nothing. The abdominal pain is exacerbated by bowel movements.  Nausea began today. The nausea is associated with eating.  The diarrhea began 3 to 5 days ago. The diarrhea is watery and semi-solid. The diarrhea occurs once per day.  The patient has had a change in bowel habit. Symptoms associated with the illness do not include anorexia, constipation, urgency, frequency or back pain. Significant associated medical issues include diverticulitis.    Past Medical History  Diagnosis Date  . Hyponatremia   . Small bowel obstruction   . Hearing deficit   . Arthritis   . Chronic back pain   . 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  . Anemia   . Dizziness   . Diverticulitis of colon 11/14/2011    History reviewed. No pertinent past surgical history.  History reviewed. No pertinent family history.  History  Substance Use Topics  . Smoking status: Former Games developer   . Smokeless tobacco: Not on file  . Alcohol Use: 0.6 oz/week    1 Cans of beer per week     per day      Review of Systems  Constitutional: Negative for fever.  Gastrointestinal: Positive for nausea, abdominal pain and diarrhea. Negative for constipation and anorexia.  Genitourinary: Negative for urgency and frequency.  Musculoskeletal: Negative for back pain.  All other systems reviewed and are negative.    Allergies  Lorazepam  Home Medications   Current Outpatient Rx  Name Route Sig Dispense Refill  . ASPIRIN 325 MG PO TABS Oral Take 325 mg by mouth daily.      . OMEGA-3 FATTY ACIDS 1000 MG PO CAPS Oral Take 1 g by mouth daily.      Marland Kitchen MECLIZINE HCL 25 MG PO TABS Oral Take 12.5 mg by mouth 2 (two) times daily as needed. dizziness    . CENTRUM PO CHEW Oral Chew 1 tablet by mouth daily.      Marland Kitchen NAPROXEN 375 MG PO TABS Oral Take 375 mg by mouth 2 (two) times daily as needed.    Marland Kitchen OMEPRAZOLE 20 MG PO CPDR Oral Take 20 mg by mouth daily.      Marland Kitchen POLYETHYLENE GLYCOL 3350 PO POWD Oral Take 17 g by mouth daily as needed. Take as needed for constipation. 255 g     BP 115/85  Pulse 120  Temp 97.5 F (36.4 C) (Oral)  Resp 24  SpO2 78%  Physical Exam  Nursing note and vitals reviewed. Constitutional: He is oriented to person, place, and time. He appears well-developed and well-nourished. No distress.  HENT:  Head: Normocephalic and atraumatic.  Right Ear: External ear normal.  Left Ear: External ear normal.  Nose: Nose normal.  Mouth/Throat: Oropharynx is clear and moist.  Eyes: Conjunctivae normal and EOM are normal. Pupils are equal, round, and reactive to light.  Neck: Normal range of motion. Neck supple.  Cardiovascular: Normal rate, regular rhythm, normal heart sounds and intact distal pulses.  Exam reveals no gallop and no friction rub.   No murmur heard. Pulmonary/Chest: Effort normal and breath sounds normal. No respiratory distress. He has no wheezes. He has no  rales. He exhibits no tenderness.  Abdominal: Soft. Bowel sounds are normal. He exhibits no distension and no mass. There is tenderness (moderate TTP over LLQ). There is no rebound and no guarding.  Genitourinary: Rectum normal and penis normal. Guaiac negative stool.  Musculoskeletal: Normal range of motion. He exhibits no edema and no tenderness.  Neurological: He is alert and oriented to person, place, and time. He has normal reflexes. He displays normal reflexes. No cranial nerve deficit. Coordination normal.  Skin: Skin is warm and dry.  Psychiatric: He has a normal mood and affect.    ED Course  Procedures (including critical care time)  Labs Reviewed  CBC WITH DIFFERENTIAL - Abnormal; Notable for the following:    WBC 11.6 (*)     RBC 4.20 (*)     HCT 37.9 (*)     Neutro Abs 8.5 (*)     Monocytes Absolute 1.1 (*)     All other components within normal limits  BASIC METABOLIC PANEL - Abnormal; Notable for the following:    Sodium 128 (*)     Chloride 89 (*)     Glucose, Bld 107 (*)     GFR calc non Af Amer 80 (*)     All other components within normal limits  URINALYSIS, ROUTINE W REFLEX MICROSCOPIC - Abnormal; Notable for the following:    APPearance CLOUDY (*)     Ketones, ur 15 (*)     All other components within normal limits  LACTIC ACID, PLASMA - Abnormal; Notable for the following:    Lactic Acid, Venous 2.6 (*)     All other components within normal limits  OCCULT BLOOD, POC DEVICE   Dg Abd Acute W/chest  03/23/2012  *RADIOLOGY REPORT*  Clinical Data: Nausea and vomiting for 2 days.  Left-sided pain. History diverticulitis and small bowel obstruction.  ACUTE ABDOMEN SERIES (ABDOMEN 2 VIEW & CHEST 1 VIEW)  Comparison: CT of 11/14/2011 and plain film of 11/14/2011.  Findings: Frontal view of the chest demonstrates midline trachea. Normal heart size.  Biapical pleural thickening. Diffuse peribronchial thickening.  Mild scarring at the left lung base.  Abominal films  demonstrate findings suspicious for free intraperitoneal air under the left hemidiaphragm. No significant air fluid levels.  Convex left lumbar spine curvature is moderate. No significant bowel distention on supine imaging. Distal gas and stool.  Probable phleboliths in the pelvis.  IMPRESSION: Suspect  free intraperitoneal air under the left hemidiaphragm. Consider further characterization with abdominal pelvic CT.   These results were called by telephone on 03/23/2012 at 7:13 p.m. to Dr Oletta Lamas, who verbally acknowledged these results.   Original Report Authenticated By: Consuello Bossier, M.D.      1. Abdominal pain   2. Nausea   3. Diarrhea  MDM    Patient is 78 y.o. male with PMH as listed above and relevant for prior episode of diverticulitis who presents to the ED with complaints of abdominal pain, diarrhea, and nausea.  AF and VS remarkable for BP of 163/111 upon arrival in the ED.  On exam, patient with moderate TTP over LLQ but otherwise non contributory (including normal rectal exam and negative hemoccult).  Acute abdominal series completed in triage and showed possible free air under left diaphragm.  Thus, CT scan completed to rule out perforation and or acute pathology such as diverticulitis.  Results showed severe diverticulosis but no evidence of inflammation or other abnormality.  Review of labs remarkable only for mildly elevated lactate at 2.6 and WBC count of 11.6.  Patient given IVF, zofran, and IV dilaudid.  S/p treatment patient noted that pain had resolved and he was able to tolerate PO intake.  It was felt that patient was safe for discharge home.  He was given strict return abdominal pain precautions and told to follow up with PCP as soon as possible.  He was discharged without acute events.          Johnney Ou, MD 03/24/12 0000

## 2012-03-23 NOTE — ED Notes (Signed)
Pt was hyper-vent. when last vitals were taken.

## 2012-03-25 DIAGNOSIS — R109 Unspecified abdominal pain: Secondary | ICD-10-CM | POA: Diagnosis not present

## 2012-03-25 DIAGNOSIS — R110 Nausea: Secondary | ICD-10-CM | POA: Diagnosis not present

## 2012-03-25 DIAGNOSIS — D649 Anemia, unspecified: Secondary | ICD-10-CM | POA: Diagnosis not present

## 2012-03-25 DIAGNOSIS — R1032 Left lower quadrant pain: Secondary | ICD-10-CM | POA: Diagnosis not present

## 2012-03-25 DIAGNOSIS — K5900 Constipation, unspecified: Secondary | ICD-10-CM | POA: Diagnosis not present

## 2012-03-25 DIAGNOSIS — R52 Pain, unspecified: Secondary | ICD-10-CM | POA: Diagnosis not present

## 2012-03-29 ENCOUNTER — Encounter: Payer: Self-pay | Admitting: Gastroenterology

## 2012-03-29 ENCOUNTER — Ambulatory Visit (INDEPENDENT_AMBULATORY_CARE_PROVIDER_SITE_OTHER): Payer: Medicare Other | Admitting: Gastroenterology

## 2012-03-29 VITALS — BP 119/73 | HR 84 | Temp 97.6°F | Ht 68.0 in | Wt 162.6 lb

## 2012-03-29 DIAGNOSIS — D649 Anemia, unspecified: Secondary | ICD-10-CM

## 2012-03-29 DIAGNOSIS — R109 Unspecified abdominal pain: Secondary | ICD-10-CM | POA: Diagnosis not present

## 2012-03-29 DIAGNOSIS — K219 Gastro-esophageal reflux disease without esophagitis: Secondary | ICD-10-CM

## 2012-03-29 DIAGNOSIS — E871 Hypo-osmolality and hyponatremia: Secondary | ICD-10-CM | POA: Diagnosis not present

## 2012-03-29 LAB — IRON: Iron: 59 ug/dL (ref 42–165)

## 2012-03-29 LAB — BASIC METABOLIC PANEL
BUN: 11 mg/dL (ref 6–23)
CO2: 26 mEq/L (ref 19–32)
Calcium: 9.2 mg/dL (ref 8.4–10.5)
Chloride: 101 mEq/L (ref 96–112)
Creat: 0.85 mg/dL (ref 0.50–1.35)

## 2012-03-29 LAB — FERRITIN: Ferritin: 112 ng/mL (ref 22–322)

## 2012-03-29 MED ORDER — POLYETHYLENE GLYCOL 3350 17 GM/SCOOP PO POWD: 17.0000 g | Freq: Every day | ORAL | Status: DC

## 2012-03-29 MED ORDER — PEG-KCL-NACL-NASULF-NA ASC-C 100 G PO SOLR: 1.0000 | ORAL | Status: DC

## 2012-03-29 MED ORDER — OMEPRAZOLE 20 MG PO CPDR: 20.0000 mg | DELAYED_RELEASE_CAPSULE | Freq: Every day | ORAL | Status: DC

## 2012-03-29 NOTE — Progress Notes (Signed)
Primary Care Physician:  PATTERSON, KATHY, NP Primary Gastroenterologist:  Dr.   Chief Complaint  Patient presents with  . Abdominal Pain    HPI:    78-year-old male referred secondary to abdominal pain. Reports LLQ pain for several months, hospitalized in May 2013 for mild diverticulitis of left colon. Extensive diverticulosis from splenic flexure to rectum noted on CT. Also notable, hospitalized for possible ileus in Nov 2012 but abdominal xray and CT without evidence of ileus or obstruction. Improved with supportive measures and decompression via NGT.   Returns today stating intermittent LLQ pain for several months. Poor historian. Daughter and son both present. Notes intermittent diarrhea and constipation. Sometimes has to use his finger to digitally disimpact. No brbpr.Denies rectal bleeding. + nausea, no wt loss or lack of appetite. Stays weak. Taking Pepto Bismol three times a day AND miralax. No piror colonoscopy. Occasional indigestion. Confused more than recently.   Was seen in ED recently with CT scan performed but no evidence of diverticulitis. He presented to [facility] as well the same day for a "second opinion" and was sent home shortly thereafter with instructions to keep his appointment here.   Hgb 13.6 in ED, heme negative. Evidence of mild, normocytic anemia over past year.   As of note, issues with hyponatremia during last hospitalization. Will need to recheck.   Past Medical History  Diagnosis Date  . Hyponatremia   . Small bowel obstruction   . Hearing deficit   . Arthritis   . Chronic back pain   . 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  . Anemia   . Dizziness   . Diverticulitis of colon 11/14/2011    Past Surgical History  Procedure Date  . None     Current Outpatient Prescriptions  Medication Sig Dispense Refill  . aluminum & magnesium hydroxide (MAALOX) 225-200 MG/5ML suspension Take 15 mLs by mouth every 6 (six) hours as needed.      .  aspirin EC 81 MG tablet Take 162 mg by mouth every morning.      . bismuth subsalicylate (PEPTO BISMOL) 262 MG/15ML suspension Take 15 mLs by mouth 3 (three) times daily.      . HYDROcodone-acetaminophen (VICODIN) 5-500 MG per tablet Take 1-2 tablets by mouth every 6 (six) hours as needed for pain.  15 tablet  0  . meclizine (ANTIVERT) 25 MG tablet Take 25 mg by mouth 2 (two) times daily as needed. dizziness      . multivitamin-iron-minerals-folic acid (CENTRUM) chewable tablet Chew 1 tablet by mouth daily.        . ondansetron (ZOFRAN ODT) 4 MG disintegrating tablet 4mg ODT q4 hours prn nausea/vomit  8 tablet  0  . fish oil-omega-3 fatty acids 1000 MG capsule Take 1 g by mouth daily.        . omeprazole (PRILOSEC) 20 MG capsule Take 1 capsule (20 mg total) by mouth daily. 30 minutes before first meal of the day. For reflux.  30 capsule  3  . peg 3350 powder (MOVIPREP) 100 G SOLR Take 1 kit (100 g total) by mouth as directed.  1 kit  0  . polyethylene glycol powder (GLYCOLAX/MIRALAX) powder Take 17 g by mouth daily. For constipation  255 g  3    Allergies as of 03/29/2012 - Review Complete 03/29/2012  Allergen Reaction Noted  . Lorazepam Other (See Comments) 05/12/2011    Family History  Problem Relation Age of Onset  . Colon cancer Neg   Hx     History   Social History  . Marital Status: Married    Spouse Name: N/A    Number of Children: N/A  . Years of Education: N/A   Occupational History  . Not on file.   Social History Main Topics  . Smoking status: Former Smoker -- 0.3 packs/day    Types: Cigarettes  . Smokeless tobacco: Former User    Quit date: 07/29/1958  . Alcohol Use: 0.6 oz/week    1 Cans of beer per week     per day  . Drug Use: No  . Sexually Active: No   Other Topics Concern  . Not on file   Social History Narrative  . No narrative on file    Review of Systems: Gen: Denies any fever, chills, fatigue, weight loss, lack of appetite.  CV: Denies chest  pain, heart palpitations, peripheral edema, syncope.  Resp: Denies shortness of breath at rest or with exertion. Denies wheezing or cough.  GI: Denies dysphagia or odynophagia. Denies jaundice, hematemesis, fecal incontinence. GU : Denies urinary burning, urinary frequency, urinary hesitancy MS: + back pain, arthritis Derm: Denies rash, itching, dry skin Psych: Denies depression, anxiety, memory loss, and confusion Heme: Denies bruising, bleeding, and enlarged lymph nodes.  Physical Exam: BP 119/73  Pulse 84  Temp 97.6 F (36.4 C) (Temporal)  Ht 5' 8" (1.727 m)  Wt 162 lb 9.6 oz (73.755 kg)  BMI 24.72 kg/m2 General:   Alert and oriented. Pleasant and cooperative. Well-nourished and well-developed.  Head:  Normocephalic and atraumatic. Eyes:  Without icterus, sclera clear and conjunctiva pink.  Ears:  Very HOH Nose:  No deformity, discharge,  or lesions. Mouth:  No deformity or lesions, oral mucosa pink.  Neck:  Supple, without mass or thyromegaly. Lungs:  Clear to auscultation bilaterally. No wheezes, rales, or rhonchi. No distress.  Heart:  S1, S2 present without murmurs appreciated.  Abdomen:  +BS, soft, non-tender and non-distended. No HSM noted. No guarding or rebound. No masses appreciated.  Rectal:  Deferred  Msk:  Symmetrical without gross deformities. Normal posture. Extremities:  Trace pedal edema Neurologic:  Alert and  oriented x4;  grossly normal neurologically. Skin:  Intact without significant lesions or rashes. Psych:  Alert and cooperative. Normal mood and affect.    

## 2012-03-29 NOTE — Patient Instructions (Addendum)
Stop taking Pepto Bismol.  Take Miralax daily for constipation. If you start having more frequent or runny stools, cut back to every other day.   Start taking Prilosec (a reflux medication), every day. This needs to be 30 minutes before your first meal of the day.  Follow a high fiber diet. Please see attached.  Please have blood work completed. We will call you with the results.  You have been set up for a colonoscopy and possibly an upper endoscopy if needed.

## 2012-03-30 NOTE — ED Provider Notes (Signed)
Medical screening examination/treatment/procedure(s) were conducted as a shared visit with non-physician practitioner(s) and myself.  I personally evaluated the patient during the encounter.  78 year old male with abdominal pain. Some mild tenderness over the left lower quadrant without rebound or guarding. NAD. Heart RRR. Lungs clear.  Imaging of the abdomen and pelvis without evidence of acute emergent pathology. Patient's pain is controlled and is not vomiting. He tolerated oral intake in emergency room. He is hemodynamically stable. Feel he is for discharge at this time. Strict return precautions were discussed.  Raeford Razor, MD 03/30/12 [phone]

## 2012-03-31 ENCOUNTER — Encounter: Payer: Self-pay | Admitting: Gastroenterology

## 2012-03-31 DIAGNOSIS — G8929 Other chronic pain: Secondary | ICD-10-CM | POA: Insufficient documentation

## 2012-03-31 DIAGNOSIS — K219 Gastro-esophageal reflux disease without esophagitis: Secondary | ICD-10-CM | POA: Insufficient documentation

## 2012-03-31 NOTE — Assessment & Plan Note (Signed)
Check ferritin, iron. Heme negative recently on file. No evidence of rectal bleeding. Only upper GI symptoms are intermittent indigestion, reflux. TCS as planned, possible EGD if IDA.

## 2012-03-31 NOTE — Assessment & Plan Note (Signed)
78 year old male with several month hx of LLQ pain, hx of diverticulitis documented by CT in May 2013. Most recent CT a few days ago without acute diverticulitis. No prior colonoscopy. Notes intermittent constipation, diarrhea. Has been taking Pepto Bismol three times a day along with Miralax. I have asked him to stop taking Pepto, follow high fiber diet, take Miralax prn constipation. Need lower GI evaluation as he has never had a colonoscopy. As of note, mild normocytic anemia noted over past year. Add ferritin, iron to labs today. Consider EGD if evidence of IDA.   Proceed with TCS+/- EGD with Dr. Jena Gauss in near future: the risks, benefits, and alternatives have been discussed with the patient in detail. The patient states understanding and desires to proceed.

## 2012-03-31 NOTE — Assessment & Plan Note (Signed)
Intermittent reflux, +nausea but no wt loss, lack of appetite, dysphagia. Place on PPI. Short course of Zofran prn. Possible EGD at time of TCS if evidence of IDA; otherwise, no need for upper GI evaluation unless worsening of symptoms despite PPI.

## 2012-03-31 NOTE — Progress Notes (Signed)
Faxed to PCP

## 2012-03-31 NOTE — Assessment & Plan Note (Signed)
Dealt with during last hospital admission. Recheck BMP.

## 2012-04-05 ENCOUNTER — Encounter (HOSPITAL_COMMUNITY): Payer: Self-pay

## 2012-04-07 ENCOUNTER — Other Ambulatory Visit: Payer: Self-pay | Admitting: Internal Medicine

## 2012-04-07 DIAGNOSIS — D649 Anemia, unspecified: Secondary | ICD-10-CM

## 2012-04-07 DIAGNOSIS — R109 Unspecified abdominal pain: Secondary | ICD-10-CM

## 2012-04-07 DIAGNOSIS — K219 Gastro-esophageal reflux disease without esophagitis: Secondary | ICD-10-CM

## 2012-04-07 MED ORDER — SODIUM CHLORIDE 0.9 % IV SOLN: INTRAVENOUS | Status: DC

## 2012-04-07 MED ORDER — SODIUM CHLORIDE 0.45 % IV SOLN
INTRAVENOUS | Status: DC
  Administered 2012-04-08: 14:00:00 via INTRAVENOUS

## 2012-04-08 ENCOUNTER — Encounter (HOSPITAL_COMMUNITY)
Admission: RE | Disposition: A | Discharge status: Home / Self Care | Payer: Self-pay | Source: Ambulatory Visit | Attending: Internal Medicine

## 2012-04-08 ENCOUNTER — Encounter (HOSPITAL_COMMUNITY): Payer: Self-pay | Admitting: *Deleted

## 2012-04-08 ENCOUNTER — Ambulatory Visit (HOSPITAL_COMMUNITY)
Admission: RE | Admit: 2012-04-08 | Discharge: 2012-04-08 | Disposition: A | Discharge status: Home / Self Care | Payer: Medicare Other | Source: Ambulatory Visit | Attending: Internal Medicine | Admitting: Internal Medicine

## 2012-04-08 DIAGNOSIS — K219 Gastro-esophageal reflux disease without esophagitis: Secondary | ICD-10-CM

## 2012-04-08 DIAGNOSIS — R109 Unspecified abdominal pain: Secondary | ICD-10-CM

## 2012-04-08 DIAGNOSIS — Z1211 Encounter for screening for malignant neoplasm of colon: Secondary | ICD-10-CM | POA: Insufficient documentation

## 2012-04-08 DIAGNOSIS — D649 Anemia, unspecified: Secondary | ICD-10-CM

## 2012-04-08 DIAGNOSIS — K5730 Diverticulosis of large intestine without perforation or abscess without bleeding: Secondary | ICD-10-CM

## 2012-04-08 HISTORY — PX: COLONOSCOPY: SHX5424

## 2012-04-08 HISTORY — DX: Gastro-esophageal reflux disease without esophagitis: K21.9

## 2012-04-08 SURGERY — COLONOSCOPY
Anesthesia: Moderate Sedation

## 2012-04-08 MED ORDER — MEPERIDINE HCL 100 MG/ML IJ SOLN
INTRAMUSCULAR | Status: AC
  Filled 2012-04-08: qty 1

## 2012-04-08 MED ORDER — MEPERIDINE HCL 100 MG/ML IJ SOLN
INTRAMUSCULAR | Status: DC | PRN
  Administered 2012-04-08 (×3): 25 mg via INTRAVENOUS

## 2012-04-08 MED ORDER — STERILE WATER FOR IRRIGATION IR SOLN
Status: DC | PRN
  Administered 2012-04-08: 15:00:00

## 2012-04-08 MED ORDER — MIDAZOLAM HCL 5 MG/5ML IJ SOLN
INTRAMUSCULAR | Status: DC | PRN
  Administered 2012-04-08 (×3): 1 mg via INTRAVENOUS

## 2012-04-08 MED ORDER — MIDAZOLAM HCL 5 MG/5ML IJ SOLN
INTRAMUSCULAR | Status: AC
  Filled 2012-04-08: qty 10

## 2012-04-08 NOTE — H&P (View-Only) (Signed)
Primary Care Physician:  Ninfa Linden, NP Primary Gastroenterologist:  Dr.   Antony Contras Complaint  Patient presents with  . Abdominal Pain    HPI:    78 year old male referred secondary to abdominal pain. Reports LLQ pain for several months, hospitalized in May 2013 for mild diverticulitis of left colon. Extensive diverticulosis from splenic flexure to rectum noted on CT. Also notable, hospitalized for possible ileus in Nov 2012 but abdominal xray and CT without evidence of ileus or obstruction. Improved with supportive measures and decompression via NGT.   Returns today stating intermittent LLQ pain for several months. Poor historian. Daughter and son both present. Notes intermittent diarrhea and constipation. Sometimes has to use his finger to digitally disimpact. No brbpr.Denies rectal bleeding. + nausea, no wt loss or lack of appetite. Stays weak. Taking Pepto Bismol three times a day AND miralax. No piror colonoscopy. Occasional indigestion. Confused more than recently.   Was seen in ED recently with CT scan performed but no evidence of diverticulitis. He presented to [facility] as well the same day for a "second opinion" and was sent home shortly thereafter with instructions to keep his appointment here.   Hgb 13.6 in ED, heme negative. Evidence of mild, normocytic anemia over past year.   As of note, issues with hyponatremia during last hospitalization. Will need to recheck.   Past Medical History  Diagnosis Date  . Hyponatremia   . Small bowel obstruction   . Hearing deficit   . Arthritis   . Chronic back pain   . 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  . Anemia   . Dizziness   . Diverticulitis of colon 11/14/2011    Past Surgical History  Procedure Date  . None     Current Outpatient Prescriptions  Medication Sig Dispense Refill  . aluminum & magnesium hydroxide (MAALOX) 225-200 MG/5ML suspension Take 15 mLs by mouth every 6 (six) hours as needed.      Marland Kitchen  aspirin EC 81 MG tablet Take 162 mg by mouth every morning.      . bismuth subsalicylate (PEPTO BISMOL) 262 MG/15ML suspension Take 15 mLs by mouth 3 (three) times daily.      Marland Kitchen HYDROcodone-acetaminophen (VICODIN) 5-500 MG per tablet Take 1-2 tablets by mouth every 6 (six) hours as needed for pain.  15 tablet  0  . meclizine (ANTIVERT) 25 MG tablet Take 25 mg by mouth 2 (two) times daily as needed. dizziness      . multivitamin-iron-minerals-folic acid (CENTRUM) chewable tablet Chew 1 tablet by mouth daily.        . ondansetron (ZOFRAN ODT) 4 MG disintegrating tablet 4mg  ODT q4 hours prn nausea/vomit  8 tablet  0  . fish oil-omega-3 fatty acids 1000 MG capsule Take 1 g by mouth daily.        Marland Kitchen omeprazole (PRILOSEC) 20 MG capsule Take 1 capsule (20 mg total) by mouth daily. 30 minutes before first meal of the day. For reflux.  30 capsule  3  . peg 3350 powder (MOVIPREP) 100 G SOLR Take 1 kit (100 g total) by mouth as directed.  1 kit  0  . polyethylene glycol powder (GLYCOLAX/MIRALAX) powder Take 17 g by mouth daily. For constipation  255 g  3    Allergies as of 03/29/2012 - Review Complete 03/29/2012  Allergen Reaction Noted  . Lorazepam Other (See Comments) 05/12/2011    Family History  Problem Relation Age of Onset  . Colon cancer Neg  Hx     History   Social History  . Marital Status: Married    Spouse Name: N/A    Number of Children: N/A  . Years of Education: N/A   Occupational History  . Not on file.   Social History Main Topics  . Smoking status: Former Smoker -- 0.3 packs/day    Types: Cigarettes  . Smokeless tobacco: Former Neurosurgeon    Quit date: 07/29/1958  . Alcohol Use: 0.6 oz/week    1 Cans of beer per week     per day  . Drug Use: No  . Sexually Active: No   Other Topics Concern  . Not on file   Social History Narrative  . No narrative on file    Review of Systems: Gen: Denies any fever, chills, fatigue, weight loss, lack of appetite.  CV: Denies chest  pain, heart palpitations, peripheral edema, syncope.  Resp: Denies shortness of breath at rest or with exertion. Denies wheezing or cough.  GI: Denies dysphagia or odynophagia. Denies jaundice, hematemesis, fecal incontinence. GU : Denies urinary burning, urinary frequency, urinary hesitancy MS: + back pain, arthritis Derm: Denies rash, itching, dry skin Psych: Denies depression, anxiety, memory loss, and confusion Heme: Denies bruising, bleeding, and enlarged lymph nodes.  Physical Exam: BP 119/73  Pulse 84  Temp 97.6 F (36.4 C) (Temporal)  Ht 5\' 8"  (1.727 m)  Wt 162 lb 9.6 oz (73.755 kg)  BMI 24.72 kg/m2 General:   Alert and oriented. Pleasant and cooperative. Well-nourished and well-developed.  Head:  Normocephalic and atraumatic. Eyes:  Without icterus, sclera clear and conjunctiva pink.  Ears:  Very HOH Nose:  No deformity, discharge,  or lesions. Mouth:  No deformity or lesions, oral mucosa pink.  Neck:  Supple, without mass or thyromegaly. Lungs:  Clear to auscultation bilaterally. No wheezes, rales, or rhonchi. No distress.  Heart:  S1, S2 present without murmurs appreciated.  Abdomen:  +BS, soft, non-tender and non-distended. No HSM noted. No guarding or rebound. No masses appreciated.  Rectal:  Deferred  Msk:  Symmetrical without gross deformities. Normal posture. Extremities:  Trace pedal edema Neurologic:  Alert and  oriented x4;  grossly normal neurologically. Skin:  Intact without significant lesions or rashes. Psych:  Alert and cooperative. Normal mood and affect.

## 2012-04-08 NOTE — Op Note (Signed)
[facility] [address] [place], [address]   COLONOSCOPY PROCEDURE REPORT  PATIENT: Larry Cox, Larry Cox  MR#:         [id number] BIRTHDATE: 10-Sep-1921 , 89  yrs. old GENDER: Male ENDOSCOPIST: R.  Roetta Sessions, MD FACP [facility] REFERRED BY:  Ninfa Linden, NP PROCEDURE DATE:  04/08/2012 PROCEDURE:     screening colonoscopy   INDICATIONS:  Colorectal cancer screening; altered bowel function. Normal ferritin iron and serum sodium recently. Hemoccult negative.  INFORMED CONSENT:  The risks, benefits, alternatives and imponderables including but not limited to bleeding, perforation as well as the possibility of a missed lesion have been reviewed.  The potential for biopsy, lesion removal, etc. have also been discussed.  Questions have been answered.  All parties agreeable. Please see the history and physical in the medical record for more information.  MEDICATIONS:  DESCRIPTION OF PROCEDURE:  After a digital rectal exam was performed, the Pentax Colonoscope [id number]  colonoscope was advanced from the anus through the rectum and colon to the area of the cecum, ileocecal valve and appendiceal orifice.  The cecum was deeply intubated.  These structures were well-seen and photographed for the record.  From the level of the cecum and ileocecal valve, the scope was slowly and cautiously withdrawn.  The mucosal surfaces were carefully surveyed utilizing scope tip deflection to facilitate fold flattening as needed.  The scope was pulled down into the rectum where a thorough examination including retroflexion was performed.    FINDINGS:  adequate preparation. Normal rectum. Pancolonic diverticulosis (more densely populated in the left colon). The remainder of the colonic mucosa appeared normal.  THERAPEUTIC / DIAGNOSTIC MANEUVERS PERFORMED:  none  COMPLICATIONS: none  CECAL WITHDRAWAL TIME:  8 minutes  IMPRESSION:  Pancolonic diverticulosis  RECOMMENDATIONS:  daily fiber supplement in the way of Metamucil. MiraLax to be taken nightly only on any given day without a bowel movement. Would also add a probiotic to his regimen in the way of Align or other similar product. Office visit with [place] in 6-8 weeks.   _______________________________ eSigned:  R. Roetta Sessions, MD FACP [facility] 04/08/2012 3:13 PM   CC:

## 2012-04-08 NOTE — Interval H&P Note (Signed)
History and Physical Interval Note:  04/08/2012 2:26 PM  Larry Cox  has presented today for surgery, with the diagnosis of screening colonoscopy, possible EGD if evidence of IDA  The various methods of treatment have been discussed with the patient and family. After consideration of risks, benefits and other options for treatment, the patient has consented to  Procedure(s) (LRB) with comments: COLONOSCOPY (N/A) ESOPHAGOGASTRODUODENOSCOPY (EGD) (N/A) - POSSIBLE EGD, IF EVIDENCE OF IDA as a surgical intervention .  The patient's history has been reviewed, patient examined, no change in status, stable for surgery.  I have reviewed the patient's chart and labs.  Questions were answered to the patient's satisfaction.     [name]

## 2012-04-09 ENCOUNTER — Telehealth: Payer: Self-pay

## 2012-04-09 NOTE — Telephone Encounter (Signed)
Patient should not need tramadol related to recent colonoscopy. I would follow my post colonoscopy instructions closely.  Would offer followup appointment here if ongoing pain issues

## 2012-04-09 NOTE — Telephone Encounter (Signed)
Pt's daughter(Mary Dewayne Hatch) called this morning to see if her father could have a refill on the Tramadol 50 mg. I told her that I would have to ask and see. The doctor at [facility] is the one that gave him the Tramadol. She said that he was in some pain today. He had a TCS Thursday. Please advise. She can be reached at [phone].

## 2012-04-12 ENCOUNTER — Telehealth: Payer: Self-pay | Admitting: Internal Medicine

## 2012-04-12 MED ORDER — TRAMADOL HCL 50 MG PO TABS: 50.0000 mg | ORAL_TABLET | Freq: Four times a day (QID) | ORAL | Status: DC | PRN

## 2012-04-12 MED ORDER — ONDANSETRON HCL 4 MG PO TABS: 4.0000 mg | ORAL_TABLET | Freq: Three times a day (TID) | ORAL | Status: DC | PRN

## 2012-04-12 NOTE — Telephone Encounter (Signed)
Routing to AS 

## 2012-04-12 NOTE — Telephone Encounter (Signed)
Spoke with daughter. She was somewhat upset she had not heard back after calling at 1pm. She did not leave information with the front desk regarding what the call was pertaining to. I apologized and told her I had patients this afternoon but was glad she called back and was able to reach me.  She states her dad continues to be in pain, same as prior to procedure. No significant changes. + nausea as well. Requested refill on Tramadol, which he has been taking prior to TCS. I told her I would refill it with a short course, send in more Zofran, and have him come in earlier than November. Needs further investigation at this point.    Darl Pikes, please have patient come in Wednesday or Thursday this week to see me. Thanks!

## 2012-04-12 NOTE — Telephone Encounter (Signed)
Please call daughter [place]) back at [phone]. She wanted to speak with AS and didn't give me any details. She wanted to leave AS a voice mail. I told her AS does not have voice mail and I could take a message. She just asked if someone would call her back regarding patient.

## 2012-04-12 NOTE — Telephone Encounter (Signed)
Tobi Bastos spoke with pts daughter.

## 2012-04-12 NOTE — Addendum Note (Signed)
Addended by: Nira Retort on: 04/12/2012 04:50 PM   Modules accepted: Orders

## 2012-04-13 ENCOUNTER — Encounter (HOSPITAL_COMMUNITY): Payer: Self-pay | Admitting: Internal Medicine

## 2012-04-13 NOTE — Telephone Encounter (Signed)
Daughter is aware of OV on Thursday at 330 with AS

## 2012-04-14 ENCOUNTER — Encounter: Payer: Self-pay | Admitting: Internal Medicine

## 2012-04-15 ENCOUNTER — Encounter: Payer: Self-pay | Admitting: Gastroenterology

## 2012-04-15 ENCOUNTER — Ambulatory Visit (INDEPENDENT_AMBULATORY_CARE_PROVIDER_SITE_OTHER): Payer: Medicare Other | Admitting: Gastroenterology

## 2012-04-15 VITALS — BP 120/70 | HR 80 | Temp 98.3°F | Ht 68.0 in | Wt 158.4 lb

## 2012-04-15 DIAGNOSIS — R109 Unspecified abdominal pain: Secondary | ICD-10-CM | POA: Diagnosis not present

## 2012-04-15 NOTE — Assessment & Plan Note (Signed)
78 year old with several months of LLQ pain, hx of diverticulitis but no evidence of acute diverticulitis on most recent CT scan Sept 2013. Noted large left inguinal hernia, fat-containing. TCS up-to-date with pan diverticulosis. Continues to complain of left-sided pain and nausea. Query hernia causing symptoms? Bowel habits improved with fiber, Miralax as needed. +nausea, appears to be r/t incidences of pain. No early satiety, dysphagia, significant wt loss, lack of appetite. At this point, I'm hesitant to pursue upper GI evaluation, as I feel the nausea may be secondary to acute episodes of pain. Differentials include gastritis, PUD, occult biliary disease. He is on a PPI, taking Zofran prn.  Left-sided abdominal pain worsened after significant activity on tractor a day prior. I've discussed with the patient referring to Dr. Lovell Sheehan for evaluation. Given his age, he may not be a candidate for hernia repair, but he is quite active and if this is truly the culprit for pain, would add to his quality of life.   Consider EGD , ?HIDA if nausea persists despite hernia repair intervention or if no hernia repair is deemed necessary.

## 2012-04-15 NOTE — Patient Instructions (Addendum)
We have referred you to Dr. Lovell Sheehan to evaluate your hernia.  Further recommendations once this is complete.   Continue the high fiber diet, Miralax daily as needed for bowel movement.

## 2012-04-15 NOTE — Progress Notes (Signed)
Referring Provider: Jeni Salles, FNP Primary Care Physician:  Ninfa Linden, FNP Primary Gastroenterologist: Dr. Jena Gauss   Chief Complaint  Patient presents with  . Follow-up    HPI:   78 year old male presenting in f/u from colonoscopy, hx of LLQ pain, diverticulitis. Colonoscopy results as below. Notes continued LLQ pain without CT evidence for acute diverticulitis. Most recent CT outlined below. Fat-containing left inguinal hernia noted. Taking supplemental fiber as directed. 2 good bowel movements this am. Miralax prn. Taking Tramadol for pain. Zofran for nausea. Vague reports of nausea, unclear if r/t pain. Denies worsening of nausea with eating, no loss of appetite, no early satiety. No evidence of IDA. Notes he just feels tired. Rode the tractor a few days ago and "overdid it". The next day, significant left-sided abdominal pain. Pepsi helps with nausea.   Lab Results  Component Value Date   ALT 18 11/14/2011   AST 24 11/14/2011   ALKPHOS 51 11/14/2011   BILITOT 0.5 11/14/2011    Lab Results  Component Value Date   WBC 11.6* 03/23/2012   HGB 13.6 03/23/2012   HCT 37.9* 03/23/2012   MCV 90.2 03/23/2012   PLT 365 03/23/2012   Lab Results  Component Value Date   IRON 59 03/29/2012   FERRITIN 112 03/29/2012   SEPT 2013 CT Findings: Interval mildly improved ventilation at the lung bases  with chronic scarring or atelectasis. Stable mild cardiomegaly  with no pericardial or pleural effusion.  Chronic scoliosis and degenerative changes in the spine. No acute  osseous abnormality identified.  Chronic large but fat only containing left inguinal hernia is  stable. No pelvic free fluid. Bladder is diminutive.  Severe diverticulosis throughout the distal colon involving the  proximal rectum as well. Retained contrast within the numerous  diverticula. No definite areas of active inflammation.  Respiratory motion artifact in the abdomen. Diverticulosis  continues to the splenic  flexure. The transverse colon is better  evaluated on the renal delayed images and appears within normal  limits. The right colon is within normal limits.  No dilated small bowel. Stable liver, gallbladder, spleen,  pancreas and adrenal glands. Duodenal diverticulum in the second  portion is unchanged. Stomach is within normal limits. Kidneys  are stable, with bilateral anterior renal axis. Chronic ectasia  and some calcified atherosclerosis of the abdominal aorta and iliac  vessels. No abdominal free fluid. No lymphadenopathy identified.  No free air.  IMPRESSION:  Stable CT of the abdomen and pelvis, including the finding of  severe diverticulosis from the splenic flexure to the rectum, with  no acute or inflammatory findings identified.   Past Medical History  Diagnosis Date  . Hyponatremia   . Small bowel obstruction   . Hearing deficit   . Arthritis   . Chronic back pain   . 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  . Anemia   . Dizziness   . Diverticulitis of colon 11/14/2011  . GERD (gastroesophageal reflux disease)     Past Surgical History  Procedure Date  . None   . No past surgeries   . Colonoscopy 04/08/2012    Pancolonic diverticulosis    Current Outpatient Prescriptions  Medication Sig Dispense Refill  . aluminum & magnesium hydroxide (MAALOX) 225-200 MG/5ML suspension Take 15 mLs by mouth daily.       Marland Kitchen aspirin EC 81 MG tablet Take 81 mg by mouth 2 (two) times daily.       . fish oil-omega-3 fatty  acids 1000 MG capsule Take 1 g by mouth daily.        . Multiple Vitamin (MULTIVITAMIN WITH MINERALS) TABS Take 1 tablet by mouth daily.      Marland Kitchen omeprazole (PRILOSEC) 20 MG capsule Take 1 capsule (20 mg total) by mouth daily. 30 minutes before first meal of the day. For reflux.  30 capsule  3  . ondansetron (ZOFRAN) 4 MG tablet Take 1 tablet (4 mg total) by mouth every 8 (eight) hours as needed for nausea.  30 tablet  1  . polyethylene glycol powder  (GLYCOLAX/MIRALAX) powder Take 17 g by mouth daily. For constipation  255 g  3  . Probiotic Product (PROBIOTIC PO) Take by mouth daily.      . traMADol (ULTRAM) 50 MG tablet Take 1 tablet (50 mg total) by mouth every 6 (six) hours as needed for pain.  20 tablet  0  . HYDROcodone-acetaminophen (VICODIN) 5-500 MG per tablet Take 1-2 tablets by mouth every 6 (six) hours as needed for pain.  15 tablet  0  . meclizine (ANTIVERT) 25 MG tablet Take 25 mg by mouth 2 (two) times daily as needed. dizziness      . ondansetron (ZOFRAN ODT) 4 MG disintegrating tablet 4mg  ODT q4 hours prn nausea/vomit  8 tablet  0  . peg 3350 powder (MOVIPREP) 100 G SOLR Take 1 kit (100 g total) by mouth as directed.  1 kit  0    Allergies as of 04/15/2012 - Review Complete 04/15/2012  Allergen Reaction Noted  . Lorazepam Other (See Comments) 05/12/2011    Family History  Problem Relation Age of Onset  . Colon cancer Neg Hx     History   Social History  . Marital Status: Married    Spouse Name: N/A    Number of Children: N/A  . Years of Education: N/A   Social History Main Topics  . Smoking status: Former Smoker -- 0.3 packs/day    Types: Cigarettes  . Smokeless tobacco: Former Neurosurgeon    Quit date: 07/29/1958  . Alcohol Use: 0.6 oz/week    1 Cans of beer per week     1 beer occasionally  . Drug Use: No  . Sexually Active: No   Other Topics Concern  . None   Social History Narrative  . None    Review of Systems: Gen: SEE HPI  CV: Denies chest pain, palpitations, syncope, peripheral edema, and claudication. Resp: Denies dyspnea at rest, cough, wheezing, coughing up blood, and pleurisy. GI: Denies vomiting blood, jaundice, and fecal incontinence.   Denies dysphagia or odynophagia. Derm: Denies rash, itching, dry skin Psych: Denies depression, anxiety, memory loss, confusion. No homicidal or suicidal ideation.  Heme: Denies bruising, bleeding, and enlarged lymph nodes.  Physical Exam: BP 120/70   Pulse 80  Temp 98.3 F (36.8 C) (Temporal)  Ht 5\' 8"  (1.727 m)  Wt 158 lb 6.4 oz (71.85 kg)  BMI 24.08 kg/m2 General:   Alert and oriented. No distress noted. Pleasant and cooperative.  Head:  Normocephalic and atraumatic. Eyes:  Conjuctiva clear without scleral icterus. Heart:  S1, S2 present without murmurs, rubs, or gallops. Regular rate and rhythm. Abdomen:  +BS, soft,  and non-distended. No rebound or guarding. +rectus diastasis vs ventral hernia. Left-sided TTP, left of umbilicus.  Extremities:  Without edema. Neurologic:  Alert and  oriented x4;  grossly normal neurologically. Skin:  Intact without significant lesions or rashes. Cervical Nodes:  No significant cervical adenopathy. Psych:  Alert and cooperative. Normal mood and affect.

## 2012-04-15 NOTE — Progress Notes (Signed)
Faxed to PCP

## 2012-04-16 ENCOUNTER — Telehealth: Payer: Self-pay | Admitting: Gastroenterology

## 2012-04-16 NOTE — Telephone Encounter (Signed)
Patient has an appointment scheduled with Dr. Lovell Sheehan on 10/29 at 1:45 and patient and his daughter are aware

## 2012-04-17 ENCOUNTER — Emergency Department (HOSPITAL_COMMUNITY)
Admission: EM | Admit: 2012-04-17 | Discharge: 2012-04-17 | Disposition: A | Discharge status: Home / Self Care | Payer: Medicare Other | Attending: Emergency Medicine | Admitting: Emergency Medicine

## 2012-04-17 ENCOUNTER — Emergency Department (HOSPITAL_COMMUNITY): Payer: Medicare Other

## 2012-04-17 ENCOUNTER — Encounter (HOSPITAL_COMMUNITY): Payer: Self-pay | Admitting: Emergency Medicine

## 2012-04-17 DIAGNOSIS — R1032 Left lower quadrant pain: Secondary | ICD-10-CM | POA: Insufficient documentation

## 2012-04-17 DIAGNOSIS — R51 Headache: Secondary | ICD-10-CM | POA: Insufficient documentation

## 2012-04-17 DIAGNOSIS — IMO0002 Reserved for concepts with insufficient information to code with codable children: Secondary | ICD-10-CM | POA: Diagnosis not present

## 2012-04-17 DIAGNOSIS — K5900 Constipation, unspecified: Secondary | ICD-10-CM

## 2012-04-17 DIAGNOSIS — R109 Unspecified abdominal pain: Secondary | ICD-10-CM | POA: Diagnosis not present

## 2012-04-17 DIAGNOSIS — Z79899 Other long term (current) drug therapy: Secondary | ICD-10-CM | POA: Insufficient documentation

## 2012-04-17 DIAGNOSIS — F0390 Unspecified dementia without behavioral disturbance: Secondary | ICD-10-CM | POA: Insufficient documentation

## 2012-04-17 DIAGNOSIS — G8929 Other chronic pain: Secondary | ICD-10-CM | POA: Insufficient documentation

## 2012-04-17 LAB — CBC WITH DIFFERENTIAL/PLATELET
Eosinophils Absolute: 0.1 10*3/uL (ref 0.0–0.7)
Eosinophils Relative: 1 % (ref 0–5)
HCT: 35.9 % — ABNORMAL LOW (ref 39.0–52.0)
Hemoglobin: 12.8 g/dL — ABNORMAL LOW (ref 13.0–17.0)
Lymphocytes Relative: 22 % (ref 12–46)
Lymphs Abs: 1.4 10*3/uL (ref 0.7–4.0)
MCH: 32.6 pg (ref 26.0–34.0)
MCV: 91.3 fL (ref 78.0–100.0)
Monocytes Absolute: 0.5 10*3/uL (ref 0.1–1.0)
Monocytes Relative: 8 % (ref 3–12)
Platelets: 295 10*3/uL (ref 150–400)
RBC: 3.93 MIL/uL — ABNORMAL LOW (ref 4.22–5.81)
WBC: 6.4 10*3/uL (ref 4.0–10.5)

## 2012-04-17 LAB — COMPREHENSIVE METABOLIC PANEL
ALT: 18 U/L (ref 0–53)
BUN: 8 mg/dL (ref 6–23)
CO2: 24 mEq/L (ref 19–32)
Calcium: 9.7 mg/dL (ref 8.4–10.5)
Creatinine, Ser: 0.87 mg/dL (ref 0.50–1.35)
GFR calc Af Amer: 87 mL/min — ABNORMAL LOW (ref >90)
GFR calc non Af Amer: 75 mL/min — ABNORMAL LOW (ref >90)
Glucose, Bld: 94 mg/dL (ref 70–99)
Sodium: 133 mEq/L — ABNORMAL LOW (ref 135–145)
Total Protein: 7.3 g/dL (ref 6.0–8.3)

## 2012-04-17 LAB — URINALYSIS, ROUTINE W REFLEX MICROSCOPIC
Bilirubin Urine: NEGATIVE
Leukocytes, UA: NEGATIVE
Nitrite: NEGATIVE
Specific Gravity, Urine: <1.005 — ABNORMAL LOW (ref 1.005–1.030)
Urobilinogen, UA: 0.2 mg/dL (ref 0.0–1.0)
pH: 6.5 (ref 5.0–8.0)

## 2012-04-17 MED ORDER — SODIUM CHLORIDE 0.9 % IV SOLN
INTRAVENOUS | Status: DC
  Administered 2012-04-17: 16:00:00 via INTRAVENOUS

## 2012-04-17 MED ORDER — FLEET ENEMA 7-19 GM/118ML RE ENEM
1.0000 | ENEMA | Freq: Once | RECTAL | Status: AC
  Administered 2012-04-17: 1 via RECTAL

## 2012-04-17 MED ORDER — ONDANSETRON HCL 4 MG/2ML IJ SOLN
4.0000 mg | Freq: Once | INTRAMUSCULAR | Status: AC
  Administered 2012-04-17: 4 mg via INTRAVENOUS
  Filled 2012-04-17: qty 2

## 2012-04-17 MED ORDER — BISACODYL 10 MG RE SUPP
10.0000 mg | Freq: Once | RECTAL | Status: AC
  Administered 2012-04-17: 10 mg via RECTAL
  Filled 2012-04-17: qty 1

## 2012-04-17 MED ORDER — MORPHINE SULFATE 4 MG/ML IJ SOLN
4.0000 mg | Freq: Once | INTRAMUSCULAR | Status: AC
  Administered 2012-04-17: 4 mg via INTRAVENOUS
  Filled 2012-04-17: qty 1

## 2012-04-17 NOTE — ED Notes (Signed)
Patient with c/o LLQ abdominal pain. States "it hurts all the time". H/o diverticulitis. +n/v

## 2012-04-17 NOTE — ED Notes (Signed)
Bladder scan results a total volume of 19ml of urine.

## 2012-04-17 NOTE — ED Notes (Signed)
Pt discharged. Pt stable at time of discharge. pt has no questions regarding discharge at this time. Pt voiced understanding of discharge instructions.  

## 2012-04-17 NOTE — ED Provider Notes (Signed)
History   This chart was scribed for [name], MD scribed by [name]. The patient was seen in room [facility] at 15:07   CSN: [id number]  Arrival date & time 04/17/12  1411    Chief Complaint  Patient presents with  . Abdominal Pain    (Consider location/radiation/quality/duration/timing/severity/associated sxs/prior treatment) The history is provided by the patient and a relative. No language interpreter was used.   Level 5 Caveat- Dementia  Larry Cox is a 78 y.o. male who presents to the Emergency Department complaining of intermittent severe LLQ abd pain, onset this morning when he woke up, with associated nausea, HA, constipation and dizziness . Relative states he has had left sided abd pain for several months.   States recent colonoscopy shows he has diverticulosis, as well as, hernia on left abd wall. Dr. Aura Fey office has schedule pt to have surgery consult on October 29,2013. Pt states he had to take Miralax last night and reports his last BM was this morning. States the bowel movement was small and indicates about the size of his thumb and that the constipation has persisted for a long time. Patient denies emesis, any hx of surgeries to his abd and he says he is currently has been taking 4 Tramadol QD ,for treatment of pain, along with nausea medication. Per records, a  CT scan performed 9/24 shows chronic large fat containing left inguinal hernia that's stable.    PCP: Ninfa Linden in [place] Gastroenterologist: Dr. Kendell Bane in [place]  Past Medical History  Diagnosis Date  . Hyponatremia   . Small bowel obstruction   . Hearing deficit   . Arthritis   . Chronic back pain   . 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  . Anemia   . Dizziness   . Diverticulitis of colon 11/14/2011  . GERD (gastroesophageal reflux disease)     Past Surgical History  Procedure Date  . None   . No past surgeries   . Colonoscopy 04/08/2012   Pancolonic diverticulosis    Family History  Problem Relation Age of Onset  . Colon cancer Neg Hx     History  Substance Use Topics  . Smoking status: Former Smoker -- 0.3 packs/day    Types: Cigarettes  . Smokeless tobacco: Former Neurosurgeon    Quit date: 07/29/1958  . Alcohol Use: 0.6 oz/week    1 Cans of beer per week     1 beer occasionally   Lives alone Lives at home   Review of Systems  Unable to perform ROS: Dementia  Neurological: Positive for headaches.  All other systems reviewed and are negative.    Allergies  Lorazepam  Home Medications   Current Outpatient Rx  Name Route Sig Dispense Refill  . ALUMINUM & MAGNESIUM HYDROXIDE 225-200 MG/5ML PO SUSP Oral Take 15 mLs by mouth daily.     . ASPIRIN EC 81 MG PO TBEC Oral Take 81 mg by mouth 2 (two) times daily.     . OMEGA-3 FATTY ACIDS 1000 MG PO CAPS Oral Take 1 g by mouth daily.      Marland Kitchen HYDROCODONE-ACETAMINOPHEN 5-500 MG PO TABS Oral Take 1-2 tablets by mouth every 6 (six) hours as needed for pain. 15 tablet 0  . MECLIZINE HCL 25 MG PO TABS Oral Take 25 mg by mouth 2 (two) times daily as needed. dizziness    . ADULT MULTIVITAMIN W/MINERALS CH Oral Take 1 tablet by mouth daily.    Marland Kitchen  OMEPRAZOLE 20 MG PO CPDR Oral Take 1 capsule (20 mg total) by mouth daily. 30 minutes before first meal of the day. For reflux. 30 capsule 3  . ONDANSETRON 4 MG PO TBDP  4mg  ODT q4 hours prn nausea/vomit 8 tablet 0  . ONDANSETRON HCL 4 MG PO TABS Oral Take 1 tablet (4 mg total) by mouth every 8 (eight) hours as needed for nausea. 30 tablet 1  . PEG-KCL-NACL-NASULF-NA ASC-C 100 G PO SOLR Oral Take 1 kit (100 g total) by mouth as directed. 1 kit 0  . POLYETHYLENE GLYCOL 3350 PO POWD Oral Take 17 g by mouth daily. For constipation 255 g 3  . PROBIOTIC PO Oral Take by mouth daily.    . TRAMADOL HCL 50 MG PO TABS Oral Take 1 tablet (50 mg total) by mouth every 6 (six) hours as needed for pain. 20 tablet 0    BP 132/75  Pulse 85  Temp 98 F  (36.7 C) (Oral)  Resp 19  Ht 5\' 8"  (1.727 m)  Wt 160 lb (72.576 kg)  BMI 24.33 kg/m2  SpO2 100%  Vital signs normal    Physical Exam  Nursing note and vitals reviewed. Constitutional: He is oriented to person, place, and time. He appears well-developed and well-nourished. No distress.       Has some agitation, appears to be hard of hearing  HENT:  Head: Normocephalic and atraumatic.  Right Ear: External ear normal.  Left Ear: External ear normal.  Mouth/Throat: Oropharynx is clear and moist.  Eyes: Conjunctivae normal and EOM are normal. Pupils are equal, round, and reactive to light.  Neck: Normal range of motion. Neck supple. No tracheal deviation present.  Cardiovascular: Normal rate, regular rhythm and normal heart sounds.   Pulmonary/Chest: Effort normal and breath sounds normal. No respiratory distress. He has no wheezes. He has no rales. He exhibits no tenderness.  Abdominal: Soft. He exhibits no distension. There is tenderness. There is no rebound and no guarding.       Very active bowel sounds. At [facility] there is a firmness about the size of half an orange. Located down deep into subcutaneous tissue.   Musculoskeletal: Normal range of motion. He exhibits no edema and no tenderness.  Neurological: He is alert and oriented to person, place, and time. No cranial nerve deficit or sensory deficit. Coordination normal.  Skin: Skin is warm and dry.  Psychiatric: He has a normal mood and affect. His behavior is normal.    ED Course  Procedures (including critical care time)  Medications  0.9 %  sodium chloride infusion (  Intravenous New Bag/Given 04/17/12 1555)  ondansetron (ZOFRAN) injection 4 mg (4 mg Intravenous Given 04/17/12 1556)  morphine 4 MG/ML injection 4 mg (4 mg Intravenous Given 04/17/12 1556)  bisacodyl (DULCOLAX) suppository 10 mg (10 mg Rectal Given 04/17/12 1720)  sodium phosphate (FLEET) 7-19 GM/118ML enema 1 enema (1 enema Rectal Given 04/17/12 1720)     DIAGNOSTIC STUDIES: Oxygen Saturation is 100% on room air, normal by my interpretation.    COORDINATION OF CARE:   16:54: Informed pt relative of radiology results. Pt and relative also notified that pt shows sign of constipation. Provide plans to administer enema in ED. Pt and family agreeable.  Pt has had good BM and later had another episode.   19:54: Pt had two BM. He notes improvement, but provides he continues to have abd pain.  22:05: Informed pt of intent to d/c home. Notes to relative  that there is no inflammation or medical reason for admittance  Bladder scan 19 cc so no urinary retention, family states he is having frequency.  CT ABDOMEN AND PELVIS WITH CONTRAST  03/23/2012 IMPRESSION:  Stable CT of the abdomen and pelvis, including the finding of  severe diverticulosis from the splenic flexure to the rectum, with  no acute or inflammatory findings identified.  Original Report Authenticated By: Harley Hallmark, M.D.    Results for orders placed during the hospital encounter of 04/17/12  CBC WITH DIFFERENTIAL      Component Value Range   WBC 6.4  4.0 - 10.5 K/uL   RBC 3.93 (*) 4.22 - 5.81 MIL/uL   Hemoglobin 12.8 (*) 13.0 - 17.0 g/dL   HCT 16.1 (*) 09.6 - 04.5 %   MCV 91.3  78.0 - 100.0 fL   MCH 32.6  26.0 - 34.0 pg   MCHC 35.7  30.0 - 36.0 g/dL   RDW 40.9  81.1 - 91.4 %   Platelets 295  150 - 400 K/uL   Neutrophils Relative 68  43 - 77 %   Neutro Abs 4.4  1.7 - 7.7 K/uL   Lymphocytes Relative 22  12 - 46 %   Lymphs Abs 1.4  0.7 - 4.0 K/uL   Monocytes Relative 8  3 - 12 %   Monocytes Absolute 0.5  0.1 - 1.0 K/uL   Eosinophils Relative 1  0 - 5 %   Eosinophils Absolute 0.1  0.0 - 0.7 K/uL   Basophils Relative 0  0 - 1 %   Basophils Absolute 0.0  0.0 - 0.1 K/uL  COMPREHENSIVE METABOLIC PANEL      Component Value Range   Sodium 133 (*) 135 - 145 mEq/L   Potassium 3.8  3.5 - 5.1 mEq/L   Chloride 98  96 - 112 mEq/L   CO2 24  19 - 32 mEq/L   Glucose, Bld 94   70 - 99 mg/dL   BUN 8  6 - 23 mg/dL   Creatinine, Ser 7.82  0.50 - 1.35 mg/dL   Calcium 9.7  8.4 - 95.6 mg/dL   Total Protein 7.3  6.0 - 8.3 g/dL   Albumin 4.2  3.5 - 5.2 g/dL   AST 25  0 - 37 U/L   ALT 18  0 - 53 U/L   Alkaline Phosphatase 58  39 - 117 U/L   Total Bilirubin 0.3  0.3 - 1.2 mg/dL   GFR calc non Af Amer 75 (*) >90 mL/min   GFR calc Af Amer 87 (*) >90 mL/min  URINALYSIS, ROUTINE W REFLEX MICROSCOPIC      Component Value Range   Color, Urine YELLOW  YELLOW   APPearance CLEAR  CLEAR   Specific Gravity, Urine <1.005 (*) 1.005 - 1.030   pH 6.5  5.0 - 8.0   Glucose, UA NEGATIVE  NEGATIVE mg/dL   Hgb urine dipstick NEGATIVE  NEGATIVE   Bilirubin Urine NEGATIVE  NEGATIVE   Ketones, ur NEGATIVE  NEGATIVE mg/dL   Protein, ur NEGATIVE  NEGATIVE mg/dL   Urobilinogen, UA 0.2  0.0 - 1.0 mg/dL   Nitrite NEGATIVE  NEGATIVE   Leukocytes, UA NEGATIVE  NEGATIVE    Dg Abd Acute W/chest  04/17/2012  *RADIOLOGY REPORT*  Clinical Data: Abdominal pain  ACUTE ABDOMEN SERIES (ABDOMEN 2 VIEW & CHEST 1 VIEW)  Comparison: 03/23/2012  Findings: Cardiomediastinal silhouette is stable.  No acute infiltrate or pulmonary edema.  There is nonspecific  nonobstructive bowel gas pattern.  Stool noted in the right colon and transverse colon.  Again noted lumbar scoliosis.  IMPRESSION: Nonspecific nonobstructive bowel gas pattern.  No acute disease within chest.     Ct Abdomen Pelvis Wo Contrast  04/17/2012  *RADIOLOGY REPORT*  Clinical Data: Left lower quadrant abdominal pain.  CT ABDOMEN AND PELVIS WITHOUT CONTRAST  Technique:  Multidetector CT imaging of the abdomen and pelvis was performed following the standard protocol without intravenous contrast.  Comparison: 03/23/2012.  Findings: Linear atelectasis in the left lung base similar to previous study.  Circumscribed low attenuation lesion in the anterior segment right lobe of liver measuring 2.2 cm diameter.  This is stable since previous study and  likely represents a cyst.  Additional sub- centimeter cysts seen previously are not well identified today. The gallbladder, the unenhanced appearance of the gallbladder, spleen, pancreas, adrenal glands, kidneys, and retroperitoneal lymph nodes is unremarkable.  Calcification of the aorta without aneurysm.  The inferior vena cava is somewhat flattened which may suggest hypovolemia.  The stomach, small bowel, and colon are not abnormally distended.  Moderately prominent visceral adipose tissues.  No free air or free fluid in the abdomen.  Pelvis:  Prostate is enlarged, measuring 3.4 x 5.1 cm.  Left inguinal hernia containing fat is stable.  Bladder wall is not thickened.  Diffuse diverticulosis of the left colon without inflammatory change.  No evidence of diverticulitis.  The appendix is normal.  No free or loculated pelvic fluid collections.  Lumbar scoliosis and degenerative changes.  No significant change since previous study.  IMPRESSION: No acute process demonstrated in the abdomen or pelvis.  Stable appearance since previous study.   Original Report Authenticated By: Marlon Pel, M.D.      1. Constipation   2. Abdominal pain, chronic, left lower quadrant    Plan discharge  Devoria Albe, MD, FACEP     MDM  I personally performed the services described in this documentation, which was scribed in my presence. The recorded information has been reviewed and considered.  Devoria Albe, MD, FACEP         [name], MD 04/18/12 [id number]  [name], MD 04/18/12 [id number]

## 2012-04-27 DIAGNOSIS — K4090 Unilateral inguinal hernia, without obstruction or gangrene, not specified as recurrent: Secondary | ICD-10-CM | POA: Diagnosis not present

## 2012-04-28 ENCOUNTER — Encounter (HOSPITAL_COMMUNITY): Payer: Self-pay | Admitting: Emergency Medicine

## 2012-04-28 ENCOUNTER — Observation Stay (HOSPITAL_COMMUNITY)
Admission: EM | Admit: 2012-04-28 | Discharge: 2012-04-29 | Disposition: A | Discharge status: Home / Self Care | Payer: Medicare Other | Attending: Internal Medicine | Admitting: Internal Medicine

## 2012-04-28 ENCOUNTER — Emergency Department (HOSPITAL_COMMUNITY): Payer: Medicare Other

## 2012-04-28 DIAGNOSIS — F5089 Other specified eating disorder: Secondary | ICD-10-CM

## 2012-04-28 DIAGNOSIS — E860 Dehydration: Secondary | ICD-10-CM | POA: Diagnosis present

## 2012-04-28 DIAGNOSIS — R1032 Left lower quadrant pain: Secondary | ICD-10-CM | POA: Insufficient documentation

## 2012-04-28 DIAGNOSIS — E871 Hypo-osmolality and hyponatremia: Secondary | ICD-10-CM | POA: Diagnosis not present

## 2012-04-28 DIAGNOSIS — K56609 Unspecified intestinal obstruction, unspecified as to partial versus complete obstruction: Secondary | ICD-10-CM

## 2012-04-28 DIAGNOSIS — I671 Cerebral aneurysm, nonruptured: Secondary | ICD-10-CM

## 2012-04-28 DIAGNOSIS — R109 Unspecified abdominal pain: Secondary | ICD-10-CM

## 2012-04-28 DIAGNOSIS — K219 Gastro-esophageal reflux disease without esophagitis: Secondary | ICD-10-CM | POA: Diagnosis not present

## 2012-04-28 DIAGNOSIS — R197 Diarrhea, unspecified: Secondary | ICD-10-CM | POA: Diagnosis not present

## 2012-04-28 DIAGNOSIS — R112 Nausea with vomiting, unspecified: Secondary | ICD-10-CM | POA: Insufficient documentation

## 2012-04-28 DIAGNOSIS — E872 Acidosis, unspecified: Secondary | ICD-10-CM

## 2012-04-28 DIAGNOSIS — E876 Hypokalemia: Principal | ICD-10-CM

## 2012-04-28 DIAGNOSIS — K5730 Diverticulosis of large intestine without perforation or abscess without bleeding: Secondary | ICD-10-CM | POA: Diagnosis not present

## 2012-04-28 DIAGNOSIS — IMO0001 Reserved for inherently not codable concepts without codable children: Secondary | ICD-10-CM

## 2012-04-28 DIAGNOSIS — G8929 Other chronic pain: Secondary | ICD-10-CM

## 2012-04-28 DIAGNOSIS — K4090 Unilateral inguinal hernia, without obstruction or gangrene, not specified as recurrent: Secondary | ICD-10-CM

## 2012-04-28 DIAGNOSIS — D649 Anemia, unspecified: Secondary | ICD-10-CM

## 2012-04-28 DIAGNOSIS — K5790 Diverticulosis of intestine, part unspecified, without perforation or abscess without bleeding: Secondary | ICD-10-CM

## 2012-04-28 DIAGNOSIS — K5732 Diverticulitis of large intestine without perforation or abscess without bleeding: Secondary | ICD-10-CM

## 2012-04-28 DIAGNOSIS — R42 Dizziness and giddiness: Secondary | ICD-10-CM

## 2012-04-28 DIAGNOSIS — F05 Delirium due to known physiological condition: Secondary | ICD-10-CM

## 2012-04-28 LAB — URINALYSIS, ROUTINE W REFLEX MICROSCOPIC
Glucose, UA: NEGATIVE mg/dL
Leukocytes, UA: NEGATIVE
Specific Gravity, Urine: 1.01 (ref 1.005–1.030)

## 2012-04-28 LAB — CBC WITH DIFFERENTIAL/PLATELET
Eosinophils Relative: 1 % (ref 0–5)
HCT: 34.3 % — ABNORMAL LOW (ref 39.0–52.0)
Lymphocytes Relative: 17 % (ref 12–46)
Lymphs Abs: 0.9 10*3/uL (ref 0.7–4.0)
MCV: 90.5 fL (ref 78.0–100.0)
Monocytes Absolute: 0.5 10*3/uL (ref 0.1–1.0)
Neutro Abs: 4.1 10*3/uL (ref 1.7–7.7)
Platelets: 259 10*3/uL (ref 150–400)
RBC: 3.79 MIL/uL — ABNORMAL LOW (ref 4.22–5.81)
WBC: 5.6 10*3/uL (ref 4.0–10.5)

## 2012-04-28 LAB — COMPREHENSIVE METABOLIC PANEL
ALT: 21 U/L (ref 0–53)
AST: 26 U/L (ref 0–37)
Albumin: 3.9 g/dL (ref 3.5–5.2)
Calcium: 9.4 mg/dL (ref 8.4–10.5)
Sodium: 129 mEq/L — ABNORMAL LOW (ref 135–145)
Total Protein: 6.9 g/dL (ref 6.0–8.3)

## 2012-04-28 LAB — URINE MICROSCOPIC-ADD ON

## 2012-04-28 MED ORDER — MORPHINE SULFATE 4 MG/ML IJ SOLN
4.0000 mg | Freq: Once | INTRAMUSCULAR | Status: AC
  Administered 2012-04-28: 4 mg via INTRAVENOUS
  Filled 2012-04-28: qty 1

## 2012-04-28 MED ORDER — HYDROMORPHONE HCL PF 1 MG/ML IJ SOLN
1.0000 mg | Freq: Once | INTRAMUSCULAR | Status: AC
  Administered 2012-04-28: 1 mg via INTRAVENOUS
  Filled 2012-04-28: qty 1

## 2012-04-28 MED ORDER — SODIUM CHLORIDE 0.9 % IV SOLN
1000.0000 mL | INTRAVENOUS | Status: DC
  Administered 2012-04-28: 1000 mL via INTRAVENOUS

## 2012-04-28 MED ORDER — ONDANSETRON HCL 4 MG/2ML IJ SOLN
4.0000 mg | Freq: Once | INTRAMUSCULAR | Status: AC
  Administered 2012-04-28: 4 mg via INTRAVENOUS
  Filled 2012-04-28: qty 2

## 2012-04-28 NOTE — ED Notes (Signed)
Dr Campbell at bedside.

## 2012-04-28 NOTE — ED Notes (Signed)
Patient has a history of diverticulosis; states has had nausea/vomiting for several days, but states got worse today.

## 2012-04-28 NOTE — ED Notes (Signed)
Pt reporting continued pain.

## 2012-04-28 NOTE — ED Provider Notes (Cosign Needed)
History     CSN: [id number]  Arrival date & time 04/28/12  1942   First MD Initiated Contact with Patient 04/28/12 2019      Chief Complaint  Patient presents with  . Nausea  . Emesis    (Consider location/radiation/quality/duration/timing/severity/associated sxs/prior treatment) HPI Comments: The patient is an 78 year old man who complains bitterly of left lower abdominal pain. He says he has diverticulosis and a left inguinal hernia. He had been seen here about 2 weeks ago with similar symptoms. He has subsequently seen Dr. Franky Macho, general surgeon, who advised against surgery. He had recurrence of his pain today along with abdominal distention, and therefore sought reevaluation.  Patient is a 78 y.o. male presenting with abdominal pain. The history is provided by the patient and medical records. No language interpreter was used.  Abdominal Pain The primary symptoms of the illness include abdominal pain, nausea and vomiting. The primary symptoms of the illness do not include fever. The current episode started 6 to 12 hours ago. The onset of the illness was gradual. The problem has been gradually worsening.  Associated with: He has known diverticulosis of the colon, had a known left inguinal hernia. The patient has not had a change in bowel habit. Risk factors for an acute abdominal problem include being elderly. Symptoms associated with the illness do not include chills.    Past Medical History  Diagnosis Date  . Hyponatremia   . Small bowel obstruction   . Hearing deficit   . Arthritis   . Chronic back pain   . 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  . Anemia   . Dizziness   . Diverticulitis of colon 11/14/2011  . GERD (gastroesophageal reflux disease)     Past Surgical History  Procedure Date  . None   . No past surgeries   . Colonoscopy 04/08/2012    Pancolonic diverticulosis    Family History  Problem Relation Age of Onset  . Colon cancer Neg Hx      History  Substance Use Topics  . Smoking status: Former Smoker -- 0.3 packs/day    Types: Cigarettes  . Smokeless tobacco: Former Neurosurgeon    Quit date: 07/29/1958  . Alcohol Use: 0.6 oz/week    1 Cans of beer per week     1 beer occasionally      Review of Systems  Constitutional: Negative for fever and chills.  HENT: Negative.   Eyes: Negative.   Respiratory: Negative.   Cardiovascular: Negative.   Gastrointestinal: Positive for nausea, vomiting and abdominal pain. Negative for rectal pain.    Allergies  Lorazepam  Home Medications   Current Outpatient Rx  Name Route Sig Dispense Refill  . ALUMINUM & MAGNESIUM HYDROXIDE 225-200 MG/5ML PO SUSP Oral Take 15 mLs by mouth daily.     . ASPIRIN EC 81 MG PO TBEC Oral Take 81 mg by mouth 2 (two) times daily.     . OMEGA-3 FATTY ACIDS 1000 MG PO CAPS Oral Take 1 g by mouth daily.      Marland Kitchen HYDROCODONE-ACETAMINOPHEN 5-500 MG PO TABS Oral Take 1-2 tablets by mouth every 6 (six) hours as needed for pain. 15 tablet 0  . MECLIZINE HCL 25 MG PO TABS Oral Take 25 mg by mouth 2 (two) times daily as needed. dizziness    . ADULT MULTIVITAMIN W/MINERALS CH Oral Take 1 tablet by mouth daily.    Marland Kitchen OMEPRAZOLE 20 MG PO CPDR Oral Take 1  capsule (20 mg total) by mouth daily. 30 minutes before first meal of the day. For reflux. 30 capsule 3  . ONDANSETRON HCL 4 MG PO TABS Oral Take 1 tablet (4 mg total) by mouth every 8 (eight) hours as needed for nausea. 30 tablet 1  . PEG-KCL-NACL-NASULF-NA ASC-C 100 G PO SOLR Oral Take 1 kit (100 g total) by mouth as directed. 1 kit 0  . POLYETHYLENE GLYCOL 3350 PO POWD Oral Take 17 g by mouth daily. For constipation 255 g 3  . PROBIOTIC PO Oral Take 1 capsule by mouth daily.     . TRAMADOL HCL 50 MG PO TABS Oral Take 1 tablet (50 mg total) by mouth every 6 (six) hours as needed for pain. 20 tablet 0    BP 165/85  Pulse 93  Temp 98 F (36.7 C) (Oral)  Resp 20  Ht 5\' 8"  (1.727 m)  Wt 160 lb (72.576 kg)   BMI 24.33 kg/m2  SpO2 99%  Physical Exam  Nursing note and vitals reviewed. Constitutional: He is oriented to person, place, and time.       Elderly man, calling out with abdominal pain.  HENT:  Head: Normocephalic and atraumatic.  Right Ear: External ear normal.  Left Ear: External ear normal.  Mouth/Throat: Oropharynx is clear and moist.  Eyes: Conjunctivae normal and EOM are normal. Pupils are equal, round, and reactive to light.  Neck: Normal range of motion. Neck supple.  Cardiovascular: Normal rate, regular rhythm and normal heart sounds.   Pulmonary/Chest: Effort normal and breath sounds normal.  Abdominal: Soft. He exhibits distension.       He has abdominal distension and hyperactive bowel sounds.  He has left lower quadrant tenderness.  He has a left inguinal hernia, but this is nontender.  Musculoskeletal: Normal range of motion. He exhibits no edema and no tenderness.  Neurological: He is alert and oriented to person, place, and time.       No sensory or motor deficit.  Skin: Skin is warm and dry.  Psychiatric: He has a normal mood and affect. His behavior is normal.    ED Course  Procedures (including critical care time)  8:20 PM  Date: 04/28/2012  Rate: 91  Rhythm: normal sinus rhythm  QRS Axis: normal  Intervals: PR prolonged and QT prolonged  ST/T Wave abnormalities: normal  Conduction Disutrbances:first-degree A-V block   Narrative Interpretation: Abnormal EKG  Old EKG Reviewed: unchanged   10:14 PM Results for orders placed during the hospital encounter of 04/28/12  COMPREHENSIVE METABOLIC PANEL      Component Value Range   Sodium 129 (*) 135 - 145 mEq/L   Potassium 3.1 (*) 3.5 - 5.1 mEq/L   Chloride 93 (*) 96 - 112 mEq/L   CO2 21  19 - 32 mEq/L   Glucose, Bld 120 (*) 70 - 99 mg/dL   BUN 9  6 - 23 mg/dL   Creatinine, Ser 1.61  0.50 - 1.35 mg/dL   Calcium 9.4  8.4 - 09.6 mg/dL   Total Protein 6.9  6.0 - 8.3 g/dL   Albumin 3.9  3.5 - 5.2 g/dL    AST 26  0 - 37 U/L   ALT 21  0 - 53 U/L   Alkaline Phosphatase 58  39 - 117 U/L   Total Bilirubin 0.2 (*) 0.3 - 1.2 mg/dL   GFR calc non Af Amer 76 (*) >90 mL/min   GFR calc Af Amer 88 (*) >90 mL/min  LIPASE, BLOOD      Component Value Range   Lipase 25  11 - 59 U/L  URINALYSIS, ROUTINE W REFLEX MICROSCOPIC      Component Value Range   Color, Urine YELLOW  YELLOW   APPearance CLEAR  CLEAR   Specific Gravity, Urine 1.010  1.005 - 1.030   pH 6.0  5.0 - 8.0   Glucose, UA NEGATIVE  NEGATIVE mg/dL   Hgb urine dipstick TRACE (*) NEGATIVE   Bilirubin Urine NEGATIVE  NEGATIVE   Ketones, ur NEGATIVE  NEGATIVE mg/dL   Protein, ur NEGATIVE  NEGATIVE mg/dL   Urobilinogen, UA 0.2  0.0 - 1.0 mg/dL   Nitrite NEGATIVE  NEGATIVE   Leukocytes, UA NEGATIVE  NEGATIVE  CBC WITH DIFFERENTIAL      Component Value Range   WBC 5.6  4.0 - 10.5 K/uL   RBC 3.79 (*) 4.22 - 5.81 MIL/uL   Hemoglobin 12.4 (*) 13.0 - 17.0 g/dL   HCT 21.3 (*) 08.6 - 57.8 %   MCV 90.5  78.0 - 100.0 fL   MCH 32.7  26.0 - 34.0 pg   MCHC 36.2 (*) 30.0 - 36.0 g/dL   RDW 46.9  62.9 - 52.8 %   Platelets 259  150 - 400 K/uL   Neutrophils Relative 73  43 - 77 %   Neutro Abs 4.1  1.7 - 7.7 K/uL   Lymphocytes Relative 17  12 - 46 %   Lymphs Abs 0.9  0.7 - 4.0 K/uL   Monocytes Relative 9  3 - 12 %   Monocytes Absolute 0.5  0.1 - 1.0 K/uL   Eosinophils Relative 1  0 - 5 %   Eosinophils Absolute 0.0  0.0 - 0.7 K/uL   Basophils Relative 0  0 - 1 %   Basophils Absolute 0.0  0.0 - 0.1 K/uL  URINE MICROSCOPIC-ADD ON      Component Value Range   Squamous Epithelial / LPF RARE  RARE   RBC / HPF 0-2  <3 RBC/hpf   Bacteria, UA RARE  RARE   Ct Abdomen Pelvis Wo Contrast  04/17/2012  *RADIOLOGY REPORT*  Clinical Data: Left lower quadrant abdominal pain.  CT ABDOMEN AND PELVIS WITHOUT CONTRAST  Technique:  Multidetector CT imaging of the abdomen and pelvis was performed following the standard protocol without intravenous contrast.   Comparison: 03/23/2012.  Findings: Linear atelectasis in the left lung base similar to previous study.  Circumscribed low attenuation lesion in the anterior segment right lobe of liver measuring 2.2 cm diameter.  This is stable since previous study and likely represents a cyst.  Additional sub- centimeter cysts seen previously are not well identified today. The gallbladder, the unenhanced appearance of the gallbladder, spleen, pancreas, adrenal glands, kidneys, and retroperitoneal lymph nodes is unremarkable.  Calcification of the aorta without aneurysm.  The inferior vena cava is somewhat flattened which may suggest hypovolemia.  The stomach, small bowel, and colon are not abnormally distended.  Moderately prominent visceral adipose tissues.  No free air or free fluid in the abdomen.  Pelvis:  Prostate is enlarged, measuring 3.4 x 5.1 cm.  Left inguinal hernia containing fat is stable.  Bladder wall is not thickened.  Diffuse diverticulosis of the left colon without inflammatory change.  No evidence of diverticulitis.  The appendix is normal.  No free or loculated pelvic fluid collections.  Lumbar scoliosis and degenerative changes.  No significant change since previous study.  IMPRESSION: No acute process demonstrated in the  abdomen or pelvis.  Stable appearance since previous study.   Original Report Authenticated By: Marlon Pel, M.D.    Dg Abd Acute W/chest  04/28/2012  *RADIOLOGY REPORT*  Clinical Data: Nausea and vomiting.  Diarrhea.  History of diverticulitis.  ACUTE ABDOMEN SERIES (ABDOMEN 2 VIEW & CHEST 1 VIEW)  Comparison: CT abdomen and pelvis 04/17/2012.  Findings: The heart size is normal.  Linear atelectasis or scarring is present at the left lung base.  No focal airspace disease is present.  Supine and upright views of the abdomen demonstrate a nonspecific bowel gas pattern.  There is no evidence for obstruction or free air.  Leftward curvature of the lumbar spine is stable.  IMPRESSION:   1.  No acute abnormality of the chest or abdomen. 2.  Scoliosis. 3.  Stable scarring or atelectasis at the left lung base.   Original Report Authenticated By: Jamesetta Orleans. MATTERN, M.D.    Discussed with Dr. Franky Macho, who had seen pt yesterday.  He says that pt has an inguinal hernia, but it does not seem to be the cause of the pain.  Call to [facility] to see and admit pt.  10:41 PM Case discussed with Dr. Vania Rea --> admit to observation to a regular room.   1. Abdominal pain   2. Diverticulosis   3. Left inguinal hernia         Carleene Cooper III, MD 04/28/12 2241

## 2012-04-28 NOTE — ED Notes (Signed)
Attempted to call report.  Left message for Clois Dupes, RN to return call.

## 2012-04-29 ENCOUNTER — Encounter (HOSPITAL_COMMUNITY): Payer: Self-pay | Admitting: Intensive Care

## 2012-04-29 DIAGNOSIS — R109 Unspecified abdominal pain: Secondary | ICD-10-CM

## 2012-04-29 DIAGNOSIS — E876 Hypokalemia: Secondary | ICD-10-CM | POA: Diagnosis present

## 2012-04-29 LAB — HEPATIC FUNCTION PANEL
ALT: 21 U/L (ref 0–53)
Alkaline Phosphatase: 56 U/L (ref 39–117)
Bilirubin, Direct: <0.1 mg/dL (ref 0.0–0.3)
Total Bilirubin: 0.2 mg/dL — ABNORMAL LOW (ref 0.3–1.2)
Total Protein: 6.9 g/dL (ref 6.0–8.3)

## 2012-04-29 LAB — CBC
Hemoglobin: 12.2 g/dL — ABNORMAL LOW (ref 13.0–17.0)
MCH: 33 pg (ref 26.0–34.0)
MCHC: 36.3 g/dL — ABNORMAL HIGH (ref 30.0–36.0)
Platelets: 219 10*3/uL (ref 150–400)
RDW: 13 % (ref 11.5–15.5)

## 2012-04-29 LAB — MAGNESIUM: Magnesium: 2.2 mg/dL (ref 1.5–2.5)

## 2012-04-29 LAB — BASIC METABOLIC PANEL
BUN: 6 mg/dL (ref 6–23)
Calcium: 9 mg/dL (ref 8.4–10.5)
Creatinine, Ser: 0.71 mg/dL (ref 0.50–1.35)
GFR calc Af Amer: >90 mL/min (ref >90)
GFR calc non Af Amer: 81 mL/min — ABNORMAL LOW (ref >90)
Glucose, Bld: 164 mg/dL — ABNORMAL HIGH (ref 70–99)

## 2012-04-29 LAB — MRSA PCR SCREENING: MRSA by PCR: NEGATIVE

## 2012-04-29 MED ORDER — POTASSIUM CHLORIDE IN NACL 20-0.9 MEQ/L-% IV SOLN
INTRAVENOUS | Status: DC
  Administered 2012-04-29: 09:00:00 via INTRAVENOUS
  Administered 2012-04-29: 1000 mL via INTRAVENOUS

## 2012-04-29 MED ORDER — ACETAMINOPHEN 325 MG PO TABS
650.0000 mg | ORAL_TABLET | Freq: Four times a day (QID) | ORAL | Status: DC | PRN
  Administered 2012-04-29: 650 mg via ORAL

## 2012-04-29 MED ORDER — ACETAMINOPHEN 650 MG RE SUPP
650.0000 mg | RECTAL | Status: DC | PRN
  Filled 2012-04-29: qty 1

## 2012-04-29 MED ORDER — FLEET ENEMA 7-19 GM/118ML RE ENEM: 1.0000 | ENEMA | Freq: Once | RECTAL | Status: DC | PRN

## 2012-04-29 MED ORDER — ACETAMINOPHEN 325 MG PO TABS
ORAL_TABLET | ORAL | Status: AC
  Administered 2012-04-29: 650 mg via ORAL
  Filled 2012-04-29: qty 2

## 2012-04-29 MED ORDER — ONDANSETRON HCL 4 MG/2ML IJ SOLN
4.0000 mg | INTRAMUSCULAR | Status: DC | PRN
  Administered 2012-04-29 (×2): 4 mg via INTRAVENOUS
  Filled 2012-04-29 (×2): qty 2

## 2012-04-29 MED ORDER — HALOPERIDOL LACTATE 5 MG/ML IJ SOLN
2.5000 mg | Freq: Once | INTRAMUSCULAR | Status: AC
  Administered 2012-04-29: 2.5 mg via INTRAVENOUS
  Filled 2012-04-29: qty 1

## 2012-04-29 MED ORDER — POTASSIUM CHLORIDE 10 MEQ/100ML IV SOLN
10.0000 meq | INTRAVENOUS | Status: AC
  Administered 2012-04-29 (×4): 10 meq via INTRAVENOUS
  Filled 2012-04-29: qty 400

## 2012-04-29 MED ORDER — HALOPERIDOL 0.5 MG PO TABS: 0.5000 mg | ORAL_TABLET | Freq: Four times a day (QID) | ORAL | Status: DC | PRN

## 2012-04-29 MED ORDER — ONDANSETRON HCL 4 MG/2ML IJ SOLN: 4.0000 mg | Freq: Four times a day (QID) | INTRAMUSCULAR | Status: DC | PRN

## 2012-04-29 MED ORDER — HYDROMORPHONE HCL PF 1 MG/ML IJ SOLN
1.0000 mg | INTRAMUSCULAR | Status: AC | PRN
  Administered 2012-04-29: 1 mg via INTRAVENOUS
  Filled 2012-04-29: qty 1

## 2012-04-29 MED ORDER — PANTOPRAZOLE SODIUM 40 MG IV SOLR
40.0000 mg | Freq: Two times a day (BID) | INTRAVENOUS | Status: DC
  Administered 2012-04-29 (×2): 40 mg via INTRAVENOUS
  Filled 2012-04-29 (×2): qty 40

## 2012-04-29 MED ORDER — ENOXAPARIN SODIUM 30 MG/0.3ML ~~LOC~~ SOLN
30.0000 mg | SUBCUTANEOUS | Status: DC
  Administered 2012-04-29: 30 mg via SUBCUTANEOUS
  Filled 2012-04-29: qty 0.3

## 2012-04-29 NOTE — Care Management Note (Unsigned)
    Page 1 of 1   04/29/2012     10:55:11 AM   CARE MANAGEMENT NOTE 04/29/2012  Patient:  NIYAM, NIKIRK   Account Number:  [id number]  Date Initiated:  04/29/2012  Documentation initiated by:  Rosemary Holms  Subjective/Objective Assessment:   Pt lives at home alone. Son lives next door and Daughter lives in [place]. Either one of the children will stay with him when DC'd.     Action/Plan:   Anticipated DC Date:  04/30/2012   Anticipated DC Plan:  HOME/SELF CARE      DC Planning Services  CM consult      Choice offered to / List presented to:             Status of service:  In process, will continue to follow Medicare Important Message given?   (If response is "NO", the following Medicare IM given date fields will be blank) Date Medicare IM given:   Date Additional Medicare IM given:    Discharge Disposition:    Per UR Regulation:    If discussed at Long Length of Stay Meetings, dates discussed:    Comments:  04/29/12 1000 Khori Underberg Leanord Hawking RN BNS CM

## 2012-04-29 NOTE — Discharge Summary (Signed)
Physician Discharge Summary  Patient ID: Larry Cox MRN: [id number] DOB/AGE: [birth year] 76 y.o.  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    Medication List     As of 04/29/2012  2:25 PM    STOP taking these medications         multivitamin with minerals Tabs      PROBIOTIC PO      traMADol 50 MG tablet   Commonly known as: ULTRAM      TAKE these medications         aluminum & magnesium hydroxide 225-200 MG/5ML suspension   Commonly known as: MAALOX   Take 15 mLs by mouth daily.      aspirin EC 81 MG tablet   Take 81 mg by mouth 2 (two) times daily.      fish oil-omega-3 fatty acids 1000 MG capsule   Take 1 g by mouth daily.      haloperidol 0.5 MG tablet   Commonly known as: HALDOL   Take 1 tablet (0.5 mg total) by mouth every 6 (six) hours as needed. As directed      HYDROcodone-acetaminophen 5-500 MG per tablet   Commonly known as: VICODIN   Take 1-2 tablets by mouth every 6 (six) hours as needed for pain.      meclizine 25 MG tablet   Commonly known as: ANTIVERT   Take 25 mg by mouth 2 (two) times daily as needed. dizziness      omeprazole 20 MG capsule   Commonly known as: PRILOSEC   Take 1 capsule (20 mg total) by mouth daily. 30 minutes before first meal of the day. For reflux.      ondansetron 4 MG tablet   Commonly known as: ZOFRAN   Take 1 tablet (4 mg total) by mouth every 8 (eight) hours as needed for nausea.      polyethylene glycol powder powder   Commonly known as: GLYCOLAX/MIRALAX   Take 17 g by mouth daily. For constipation            Discharge Orders    Future Appointments: Provider: Department: Dept Phone: Center:   05/24/2012 1:30 PM Joselyn Arrow, NP [facility] [phone] [facility]     Future Orders Please Complete By Expires   Diet general         Follow-up Information    Follow up with [name], MD. In 1 week.   Contact information:   [address] [place] [address] [phone]          Disposition: 01-Home or Self Care  Discharged Condition: stable  Consults: Treatment Team:  Dalia Heading, MD  Labs:   Results for orders placed during the hospital encounter of 04/28/12 (from the past 48 hour(s))  COMPREHENSIVE METABOLIC PANEL     Status: Abnormal   Collection Time   04/28/12  8:35 PM      Component Value Range Comment   Sodium 129 (*) 135 - 145 mEq/L    Potassium 3.1 (*) 3.5 - 5.1 mEq/L    Chloride 93 (*) 96 - 112 mEq/L    CO2 21  19 - 32 mEq/L    Glucose, Bld 120 (*) 70 - 99 mg/dL    BUN 9  6 - 23  mg/dL    Creatinine, Ser 4.69  0.50 - 1.35 mg/dL    Calcium 9.4  8.4 - 62.9 mg/dL    Total Protein 6.9  6.0 - 8.3 g/dL    Albumin 3.9  3.5 - 5.2 g/dL    AST 26  0 - 37 U/L    ALT 21  0 - 53 U/L    Alkaline Phosphatase 58  39 - 117 U/L    Total Bilirubin 0.2 (*) 0.3 - 1.2 mg/dL    GFR calc non Af Amer 76 (*) >90 mL/min    GFR calc Af Amer 88 (*) >90 mL/min   LIPASE, BLOOD     Status: Normal   Collection Time   04/28/12  8:35 PM      Component Value Range Comment   Lipase 25  11 - 59 U/L   CBC WITH DIFFERENTIAL     Status: Abnormal   Collection Time   04/28/12  8:35 PM      Component Value Range Comment   WBC 5.6  4.0 - 10.5 K/uL    RBC 3.79 (*) 4.22 - 5.81 MIL/uL    Hemoglobin 12.4 (*) 13.0 - 17.0 g/dL    HCT 52.8 (*) 41.3 - 52.0 %    MCV 90.5  78.0 - 100.0 fL    MCH 32.7  26.0 - 34.0 pg    MCHC 36.2 (*) 30.0 - 36.0 g/dL    RDW 24.4  01.0 - 27.2 %    Platelets 259  150 - 400 K/uL    Neutrophils Relative 73  43 - 77 %    Neutro Abs 4.1  1.7 - 7.7 K/uL    Lymphocytes Relative 17  12 - 46 %    Lymphs Abs 0.9  0.7 - 4.0 K/uL    Monocytes Relative 9  3 - 12 %    Monocytes Absolute 0.5  0.1 - 1.0 K/uL     Eosinophils Relative 1  0 - 5 %    Eosinophils Absolute 0.0  0.0 - 0.7 K/uL    Basophils Relative 0  0 - 1 %    Basophils Absolute 0.0  0.0 - 0.1 K/uL   URINALYSIS, ROUTINE W REFLEX MICROSCOPIC     Status: Abnormal   Collection Time   04/28/12  8:49 PM      Component Value Range Comment   Color, Urine YELLOW  YELLOW    APPearance CLEAR  CLEAR    Specific Gravity, Urine 1.010  1.005 - 1.030    pH 6.0  5.0 - 8.0    Glucose, UA NEGATIVE  NEGATIVE mg/dL    Hgb urine dipstick TRACE (*) NEGATIVE    Bilirubin Urine NEGATIVE  NEGATIVE    Ketones, ur NEGATIVE  NEGATIVE mg/dL    Protein, ur NEGATIVE  NEGATIVE mg/dL    Urobilinogen, UA 0.2  0.0 - 1.0 mg/dL    Nitrite NEGATIVE  NEGATIVE    Leukocytes, UA NEGATIVE  NEGATIVE   URINE MICROSCOPIC-ADD ON     Status: Normal   Collection Time   04/28/12  8:49 PM      Component Value Range Comment   Squamous Epithelial / LPF RARE  RARE    RBC / HPF 0-2  <3 RBC/hpf    Bacteria, UA RARE  RARE   MRSA PCR SCREENING     Status: Normal   Collection Time   04/29/12 12:15 AM      Component Value Range Comment  MRSA by PCR NEGATIVE  NEGATIVE   HEPATIC FUNCTION PANEL     Status: Abnormal   Collection Time   04/29/12 12:16 AM      Component Value Range Comment   Total Protein 6.9  6.0 - 8.3 g/dL    Albumin 4.0  3.5 - 5.2 g/dL    AST 28  0 - 37 U/L    ALT 21  0 - 53 U/L    Alkaline Phosphatase 56  39 - 117 U/L    Total Bilirubin 0.2 (*) 0.3 - 1.2 mg/dL    Bilirubin, Direct <1.6  0.0 - 0.3 mg/dL    Indirect Bilirubin NOT CALCULATED  0.3 - 0.9 mg/dL   MAGNESIUM     Status: Normal   Collection Time   04/29/12 12:16 AM      Component Value Range Comment   Magnesium 2.2  1.5 - 2.5 mg/dL   TSH     Status: Abnormal   Collection Time   04/29/12 12:16 AM      Component Value Range Comment   TSH 8.169 (*) 0.350 - 4.500 uIU/mL   BASIC METABOLIC PANEL     Status: Abnormal   Collection Time   04/29/12  5:29 AM      Component Value Range Comment    Sodium 129 (*) 135 - 145 mEq/L    Potassium 4.1  3.5 - 5.1 mEq/L DELTA CHECK NOTED   Chloride 97  96 - 112 mEq/L    CO2 23  19 - 32 mEq/L    Glucose, Bld 164 (*) 70 - 99 mg/dL    BUN 6  6 - 23 mg/dL    Creatinine, Ser 1.09  0.50 - 1.35 mg/dL    Calcium 9.0  8.4 - 60.4 mg/dL    GFR calc non Af Amer 81 (*) >90 mL/min    GFR calc Af Amer >90  >90 mL/min   CBC     Status: Abnormal   Collection Time   04/29/12  5:29 AM      Component Value Range Comment   WBC 7.6  4.0 - 10.5 K/uL    RBC 3.70 (*) 4.22 - 5.81 MIL/uL    Hemoglobin 12.2 (*) 13.0 - 17.0 g/dL    HCT 54.0 (*) 98.1 - 52.0 %    MCV 90.8  78.0 - 100.0 fL    MCH 33.0  26.0 - 34.0 pg    MCHC 36.3 (*) 30.0 - 36.0 g/dL    RDW 19.1  47.8 - 29.5 %    Platelets 219  150 - 400 K/uL     Diagnostics:  Ct Abdomen Pelvis Wo Contrast  04/17/2012  *RADIOLOGY REPORT*  Clinical Data: Left lower quadrant abdominal pain.  CT ABDOMEN AND PELVIS WITHOUT CONTRAST  Technique:  Multidetector CT imaging of the abdomen and pelvis was performed following the standard protocol without intravenous contrast.  Comparison: 03/23/2012.  Findings: Linear atelectasis in the left lung base similar to previous study.  Circumscribed low attenuation lesion in the anterior segment right lobe of liver measuring 2.2 cm diameter.  This is stable since previous study and likely represents a cyst.  Additional sub- centimeter cysts seen previously are not well identified today. The gallbladder, the unenhanced appearance of the gallbladder, spleen, pancreas, adrenal glands, kidneys, and retroperitoneal lymph nodes is unremarkable.  Calcification of the aorta without aneurysm.  The inferior vena cava is somewhat flattened which may suggest hypovolemia.  The stomach, small bowel, and colon are  not abnormally distended.  Moderately prominent visceral adipose tissues.  No free air or free fluid in the abdomen.  Pelvis:  Prostate is enlarged, measuring 3.4 x 5.1 cm.  Left inguinal hernia  containing fat is stable.  Bladder wall is not thickened.  Diffuse diverticulosis of the left colon without inflammatory change.  No evidence of diverticulitis.  The appendix is normal.  No free or loculated pelvic fluid collections.  Lumbar scoliosis and degenerative changes.  No significant change since previous study.  IMPRESSION: No acute process demonstrated in the abdomen or pelvis.  Stable appearance since previous study.   Original Report Authenticated By: Marlon Pel, M.D.    Dg Abd Acute W/chest  04/28/2012  *RADIOLOGY REPORT*  Clinical Data: Nausea and vomiting.  Diarrhea.  History of diverticulitis.  ACUTE ABDOMEN SERIES (ABDOMEN 2 VIEW & CHEST 1 VIEW)  Comparison: CT abdomen and pelvis 04/17/2012.  Findings: The heart size is normal.  Linear atelectasis or scarring is present at the left lung base.  No focal airspace disease is present.  Supine and upright views of the abdomen demonstrate a nonspecific bowel gas pattern.  There is no evidence for obstruction or free air.  Leftward curvature of the lumbar spine is stable.  IMPRESSION:  1.  No acute abnormality of the chest or abdomen. 2.  Scoliosis. 3.  Stable scarring or atelectasis at the left lung base.   Original Report Authenticated By: Jamesetta Orleans. MATTERN, M.D.    Dg Abd Acute W/chest  04/17/2012  *RADIOLOGY REPORT*  Clinical Data: Abdominal pain  ACUTE ABDOMEN SERIES (ABDOMEN 2 VIEW & CHEST 1 VIEW)  Comparison: 03/23/2012  Findings: Cardiomediastinal silhouette is stable.  No acute infiltrate or pulmonary edema.  There is nonspecific nonobstructive bowel gas pattern.  Stool noted in the right colon and transverse colon.  Again noted lumbar scoliosis.  IMPRESSION: Nonspecific nonobstructive bowel gas pattern.  No acute disease within chest.   Original Report Authenticated By: Natasha Mead, M.D.    EKG:   Full Code   Hospital Course:   See H&P for complete admission details. Patient is an 78 year old white male with chronic  abdominal pain, previously seen by both surgery and GI. Presented with complaints of nausea vomiting. He reported having vomiting at home. It was not witnessed by family members. In the emergency room, he had no witnessed emesis, just coughing and retching, producing saliva into the emesis basin. He was concerned that it may be related to a hernia. The ED physician consulted Dr. Lovell Sheehan who recommended admission to the hospitalist and he consulted. He felt that his symptoms were unrelated to hernia, and that he is not a good candidate for hernia repair. He had no resolution of his symptoms with antiemetics. Eventually, Haldol was tried and this finally provided him some relief. He continues to complain bitterly nausea and vomiting, but he has eaten 100% of his tray, and has had no vomiting. As he is tolerating solids and liquids, he is safe for discharge home. He has antibiotics at home. I've given him a few low dose Haldol tablets to take for her intractable nausea vomiting, as this was the only thing that provided him relief in the emergency room. I instructed him to followup with Dr. Jena Gauss, if nausea vomiting continue.  Discharge Exam:  Blood pressure 109/75, pulse 60, temperature 98.1 F (36.7 C), temperature source Oral, resp. rate 9, height 5\' 8"  (1.727 m), weight 69.9 kg (154 lb 1.6 oz), SpO2 94.00%.  General: Initially  comfortable. When I arrived in the room, started hacking and coughing, complaining of nausea vomiting, but has not had any vomiting. Occasionally agitated. Lungs clear to auscultation bilaterally without wheeze rhonchi or rales Cardiovascular regular rate rhythm without murmurs gallops rubs Abdomen soft nontender nondistended  Signed: Shawnee Gambone L 04/29/2012, 2:25 PM

## 2012-04-29 NOTE — Progress Notes (Signed)
UR Chart Review Completed  

## 2012-04-29 NOTE — H&P (Signed)
[facility] History and Physical  [name]  XBJ:[id number]  DOB: 05-10-1922   DOA: 04/29/2012   PCP:   Larry Linden, FNP   Chief Complaint:  Persistent vomiting since this morning  HPI: Larry Cox is an 78 y.o. male.   Caucasian gentleman, very hard of hearing, brought in by his son for the above complaints. In fact he has had multiple visits to the emergency room over the past 6 months for persistent abdominal pains nausea and vomiting. Has been evaluated including a recent total colonoscopy, which revealed extensive pan diverticulosis without diverticulitis. He has a left inguinal hernia.   As well as nausea and vomiting he reports left lower quadrant abdominal pain and abdominal distention.  In the emergency room the patient's examination is relatively benign, but he is vomiting up copious amounts of liquid brown material, containing previous undigested food.  Patient had scrambled eggs for breakfast, and had a protein/granola bar later this evening, the vomitus seems to contain evidence of his recent meal.  No history of fever cough or cold no bloody or black stool.  His son is wondering if there is an element of dementia, he does have some short-term memory loss reportedly, but continues to manage his own finances.  In previous admissions he is become very agitated in response to Ativan.  Rewiew of Systems:   This assessment is very difficult because of patient's extreme hard of hearing  All systems negative except as marked bold or noted in the HPI;  Constitutional: Negative for malaise, fever and chills. ;  Eyes: Negative for eye pain, redness and discharge. ;  ENMT: Negative for ear pain, hoarseness, nasal congestion, sinus pressure and sore throat. ;  Cardiovascular: Negative for chest pain, palpitations, diaphoresis, dyspnea and peripheral edema. ;  Respiratory: Negative for cough, hemoptysis, wheezing and stridor. ;  Gastrointestinal: Negative for  melena, blood in stool, hematemesis, jaundice and rectal bleeding. unusual weight loss..   Genitourinary: Negative for frequency, dysuria, incontinence,flank pain and hematuria; Musculoskeletal: Negative for back pain and neck pain. Negative for swelling and trauma.;  Skin: . Negative for pruritus, rash, abrasions, bruising and skin lesion.; ulcerations Neuro: Negative for headache, lightheadedness and neck stiffness. Negative for weakness, altered level of consciousness , altered mental status, extremity weakness, burning feet, involuntary movement, seizure and syncope.  Psych: negative for anxiety, depression, insomnia, tearfulness, panic attacks, hallucinations, paranoia, suicidal or homicidal ideation    Past Medical History  Diagnosis Date  . Hyponatremia   . Small bowel obstruction   . Hearing deficit   . Arthritis   . Chronic back pain   . 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  . Anemia   . Dizziness   . Diverticulitis of colon 11/14/2011  . GERD (gastroesophageal reflux disease)     Past Surgical History  Procedure Date  . None   . No past surgeries   . Colonoscopy 04/08/2012    Pancolonic diverticulosis    Medications:  HOME MEDS: Prior to Admission medications   Medication Sig Start Date End Date Taking? Authorizing Provider  aspirin EC 81 MG tablet Take 81 mg by mouth 2 (two) times daily.    Yes Historical Provider, MD  HYDROcodone-acetaminophen (VICODIN) 5-500 MG per tablet Take 1-2 tablets by mouth every 6 (six) hours as needed for pain. 03/23/12  Yes Johnney Ou, MD  Multiple Vitamin (MULTIVITAMIN WITH MINERALS) TABS Take 1 tablet by mouth daily.   Yes Historical Provider, MD  omeprazole (PRILOSEC) 20 MG capsule Take 1 capsule (20 mg total) by mouth daily. 30 minutes before first meal of the day. For reflux. 03/29/12  Yes Nira Retort, NP  ondansetron (ZOFRAN) 4 MG tablet Take 1 tablet (4 mg total) by mouth every 8 (eight) hours as needed for nausea.  04/12/12  Yes Nira Retort, NP  polyethylene glycol powder (GLYCOLAX/MIRALAX) powder Take 17 g by mouth daily. For constipation 03/29/12  Yes Nira Retort, NP  traMADol (ULTRAM) 50 MG tablet Take 1 tablet (50 mg total) by mouth every 6 (six) hours as needed for pain. 04/12/12  Yes Nira Retort, NP  aluminum & magnesium hydroxide (MAALOX) 225-200 MG/5ML suspension Take 15 mLs by mouth daily.     Historical Provider, MD  fish oil-omega-3 fatty acids 1000 MG capsule Take 1 g by mouth daily.      Historical Provider, MD  meclizine (ANTIVERT) 25 MG tablet Take 25 mg by mouth 2 (two) times daily as needed. dizziness    Historical Provider, MD  Probiotic Product (PROBIOTIC PO) Take 1 capsule by mouth daily.     Historical Provider, MD     Allergies:  Allergies  Allergen Reactions  . Lorazepam Other (See Comments)    Hallucinations, Violent Behavior    Social History:   reports that he has quit smoking. His smoking use included Cigarettes. He smoked .3 packs per day. He quit smokeless tobacco use about 53 years ago. He reports that he drinks about .6 ounces of alcohol per week. He reports that he does not use illicit drugs.  Family History: Family History  Problem Relation Age of Onset  . Colon cancer Neg Hx      Physical Exam: Filed Vitals:   04/28/12 1951 04/28/12 2048  BP: 165/85 144/76  Pulse: 93 82  Temp: 98 F (36.7 C) 98.5 F (36.9 C)  TempSrc: Oral Oral  Resp: 20 20  Height: 5\' 8"  (1.727 m)   Weight: 72.576 kg (160 lb)   SpO2: 99% 100%   Blood pressure 144/76, pulse 82, temperature 98.5 F (36.9 C), temperature source Oral, resp. rate 20, height 5\' 8"  (1.727 m), weight 72.576 kg (160 lb), SpO2 100.00%.  GEN:  Pleasant elderly Caucasian gentleman sitting on side of the stretcher, repeatedly forcibly vomiting and retching, does not appear to be having spontaneous vomiting; does not appear to be in pain but repeatedly asked for medication for pain; difficulty cooperating with  exam because of extreme hard of hearing. Coughing frequently.  When asked, with the help of his son, to stop forcing himself to vomit, he responds, " oh I didn't know I wasn't supposed to vomit."  PSYCH:  alert and oriented ; appears anxious; affect is not flat. HEENT: Mucous membranes pink, dry, and anicteric; PERRLA; EOM intact; no cervical lymphadenopathy nor thyromegaly or carotid bruit; no JVD; Breasts:: Not examined CHEST WALL: No tenderness CHEST: Normal respiration, occasional expiratory wheezing HEART: Regular rate and rhythm; no murmurs rubs or gallops BACK: Moderate kyphosis and scoliosis; no CVA tenderness ABDOMEN: soft non-tender; no masses, no organomegaly, normal abdominal bowel sounds; no pannus; no intertriginous candida. Rectal Exam: Not done EXTREMITIES: ; age-appropriate arthropathy of the hands and knees; no edema; no ulcerations. Genitalia: not examined PULSES: 2+ and symmetric SKIN: Normal hydration no rash or ulceration CNS: Cranial nerves 2-12 grossly intact no focal lateralizing neurologic deficit   Labs on Admission:  Basic Metabolic Panel:  Lab 04/28/12 1610  NA 129*  K 3.1*  CL 93*  CO2 21  GLUCOSE 120*  BUN 9  CREATININE 0.84  CALCIUM 9.4  MG --  PHOS --   Liver Function Tests:  Lab 04/28/12 2035  AST 26  ALT 21  ALKPHOS 58  BILITOT 0.2*  PROT 6.9  ALBUMIN 3.9    Lab 04/28/12 2035  LIPASE 25  AMYLASE --   No results found for this basename: AMMONIA:5 in the last 168 hours CBC:  Lab 04/28/12 2035  WBC 5.6  NEUTROABS 4.1  HGB 12.4*  HCT 34.3*  MCV 90.5  PLT 259   Cardiac Enzymes: No results found for this basename: CKTOTAL:5,CKMB:5,CKMBINDEX:5,TROPONINI:5 in the last 168 hours BNP: No components found with this basename: POCBNP:5 D-dimer: No components found with this basename: D-DIMER:5 CBG: No results found for this basename: GLUCAP:5 in the last 168 hours  Radiological Exams on Admission: Dg Abd Acute  W/chest  04/28/2012  *RADIOLOGY REPORT*  Clinical Data: Nausea and vomiting.  Diarrhea.  History of diverticulitis.  ACUTE ABDOMEN SERIES (ABDOMEN 2 VIEW & CHEST 1 VIEW)  Comparison: CT abdomen and pelvis 04/17/2012.  Findings: The heart size is normal.  Linear atelectasis or scarring is present at the left lung base.  No focal airspace disease is present.  Supine and upright views of the abdomen demonstrate a nonspecific bowel gas pattern.  There is no evidence for obstruction or free air.  Leftward curvature of the lumbar spine is stable.  IMPRESSION:  1.  No acute abnormality of the chest or abdomen. 2.  Scoliosis. 3.  Stable scarring or atelectasis at the left lung base.   Original Report Authenticated By: Jamesetta Orleans. MATTERN, M.D.     EKG: Independently reviewed. 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 We'll hydrate with normal saline and replete potassium. Will give proton pump inhibitor but will withhold narcotics as much as possible.  Consider GI referral in the morning.  Dr. Lovell Sheehan his general surgeon as already been informed of his admission Other plans as per orders.  Code Status: FULL CODE... to be reconfirmed Family Communication: Son present at the interview and examination, and plans to discuss Disposition Plan: Likely be discharged home in the morning if remains stable, vomiting has resolved, and electrolytes are normal eyes    Hyden Soley Nocturnist [facility] Pager [phone]   04/29/2012, 12:25 AM

## 2012-04-29 NOTE — Progress Notes (Signed)
Pt discharged home today per Dr. Lendell Caprice. Pt's IV site D/C'd and WNL. Pt's VS stable at this time. Pt's daughter provided with home medication list, discharge instructions, and prescriptions. Pt's daughter verbalized understanding. Pt left floor via WC in stable condition accompanied by RN.

## 2012-04-29 NOTE — Progress Notes (Signed)
Notified MD of continued complaints of nausea after giving IV zofran.

## 2012-04-29 NOTE — Consult Note (Signed)
Patient seen in my office 2 days ago for a left inguinal hernia. This was seen on previous CAT scan. He has a chronic history of left-sided abdominal pain. It is difficult to get a full history do to his possible dementia and hearing deficit. I have told him and his family that I do not feel performing repair of the left inguinal hernia will cure him of his chronic pain. On examination today, he states his abdominal pain is now presents and no contents are protruding in the left groin region. I have told them that I would follow him as an outpatient, though he does need to get a primary care physician to do a full workup. I think his nausea and vomiting are related to his inguinal hernia.

## 2012-04-30 ENCOUNTER — Encounter: Payer: Self-pay | Admitting: Internal Medicine

## 2012-05-03 ENCOUNTER — Ambulatory Visit: Payer: Medicare Other | Admitting: Urgent Care

## 2012-05-07 DIAGNOSIS — Z23 Encounter for immunization: Secondary | ICD-10-CM | POA: Diagnosis not present

## 2012-05-10 DIAGNOSIS — R413 Other amnesia: Secondary | ICD-10-CM | POA: Diagnosis not present

## 2012-05-10 DIAGNOSIS — I10 Essential (primary) hypertension: Secondary | ICD-10-CM | POA: Diagnosis not present

## 2012-05-10 DIAGNOSIS — I499 Cardiac arrhythmia, unspecified: Secondary | ICD-10-CM | POA: Diagnosis not present

## 2012-05-10 DIAGNOSIS — E874 Mixed disorder of acid-base balance: Secondary | ICD-10-CM | POA: Diagnosis not present

## 2012-05-10 DIAGNOSIS — Z79899 Other long term (current) drug therapy: Secondary | ICD-10-CM | POA: Diagnosis not present

## 2012-05-10 DIAGNOSIS — R42 Dizziness and giddiness: Secondary | ICD-10-CM | POA: Diagnosis not present

## 2012-05-10 DIAGNOSIS — E871 Hypo-osmolality and hyponatremia: Secondary | ICD-10-CM | POA: Diagnosis not present

## 2012-05-21 ENCOUNTER — Ambulatory Visit: Payer: Medicare Other | Admitting: Urgent Care

## 2012-05-24 ENCOUNTER — Ambulatory Visit: Payer: Medicare Other | Admitting: Urgent Care

## 2012-06-18 DIAGNOSIS — R05 Cough: Secondary | ICD-10-CM | POA: Diagnosis not present

## 2012-06-18 DIAGNOSIS — E039 Hypothyroidism, unspecified: Secondary | ICD-10-CM | POA: Diagnosis not present

## 2012-06-18 DIAGNOSIS — I8390 Asymptomatic varicose veins of unspecified lower extremity: Secondary | ICD-10-CM | POA: Diagnosis not present

## 2012-08-14 ENCOUNTER — Other Ambulatory Visit: Payer: Self-pay

## 2012-10-25 DIAGNOSIS — Z79899 Other long term (current) drug therapy: Secondary | ICD-10-CM | POA: Diagnosis not present

## 2012-10-25 DIAGNOSIS — R1012 Left upper quadrant pain: Secondary | ICD-10-CM | POA: Diagnosis not present

## 2012-10-25 DIAGNOSIS — E039 Hypothyroidism, unspecified: Secondary | ICD-10-CM | POA: Diagnosis not present

## 2012-11-08 ENCOUNTER — Inpatient Hospital Stay (HOSPITAL_COMMUNITY)
Admission: EM | Admit: 2012-11-08 | Discharge: 2012-11-10 | DRG: 640 | Disposition: A | Discharge status: Home / Self Care | Payer: Medicare Other | Attending: Internal Medicine | Admitting: Internal Medicine

## 2012-11-08 ENCOUNTER — Emergency Department (HOSPITAL_COMMUNITY): Payer: Medicare Other

## 2012-11-08 ENCOUNTER — Encounter (HOSPITAL_COMMUNITY): Payer: Self-pay | Admitting: Emergency Medicine

## 2012-11-08 ENCOUNTER — Other Ambulatory Visit: Payer: Self-pay

## 2012-11-08 DIAGNOSIS — R112 Nausea with vomiting, unspecified: Secondary | ICD-10-CM | POA: Diagnosis present

## 2012-11-08 DIAGNOSIS — E871 Hypo-osmolality and hyponatremia: Principal | ICD-10-CM | POA: Diagnosis present

## 2012-11-08 DIAGNOSIS — R109 Unspecified abdominal pain: Secondary | ICD-10-CM | POA: Diagnosis present

## 2012-11-08 DIAGNOSIS — F0390 Unspecified dementia without behavioral disturbance: Secondary | ICD-10-CM | POA: Diagnosis present

## 2012-11-08 DIAGNOSIS — F0280 Dementia in other diseases classified elsewhere without behavioral disturbance: Secondary | ICD-10-CM | POA: Diagnosis not present

## 2012-11-08 DIAGNOSIS — E039 Hypothyroidism, unspecified: Secondary | ICD-10-CM | POA: Diagnosis present

## 2012-11-08 DIAGNOSIS — R252 Cramp and spasm: Secondary | ICD-10-CM | POA: Diagnosis present

## 2012-11-08 DIAGNOSIS — R6889 Other general symptoms and signs: Secondary | ICD-10-CM | POA: Diagnosis not present

## 2012-11-08 DIAGNOSIS — E872 Acidosis, unspecified: Secondary | ICD-10-CM | POA: Diagnosis present

## 2012-11-08 DIAGNOSIS — N323 Diverticulum of bladder: Secondary | ICD-10-CM | POA: Diagnosis not present

## 2012-11-08 DIAGNOSIS — G9340 Encephalopathy, unspecified: Secondary | ICD-10-CM | POA: Diagnosis present

## 2012-11-08 DIAGNOSIS — Z87891 Personal history of nicotine dependence: Secondary | ICD-10-CM | POA: Diagnosis not present

## 2012-11-08 DIAGNOSIS — K5732 Diverticulitis of large intestine without perforation or abscess without bleeding: Secondary | ICD-10-CM | POA: Diagnosis present

## 2012-11-08 DIAGNOSIS — D649 Anemia, unspecified: Secondary | ICD-10-CM | POA: Diagnosis not present

## 2012-11-08 DIAGNOSIS — K219 Gastro-esophageal reflux disease without esophagitis: Secondary | ICD-10-CM | POA: Diagnosis present

## 2012-11-08 DIAGNOSIS — R1110 Vomiting, unspecified: Secondary | ICD-10-CM | POA: Diagnosis not present

## 2012-11-08 DIAGNOSIS — G9341 Metabolic encephalopathy: Secondary | ICD-10-CM | POA: Diagnosis not present

## 2012-11-08 DIAGNOSIS — R4182 Altered mental status, unspecified: Secondary | ICD-10-CM | POA: Diagnosis not present

## 2012-11-08 DIAGNOSIS — I671 Cerebral aneurysm, nonruptured: Secondary | ICD-10-CM

## 2012-11-08 DIAGNOSIS — K56609 Unspecified intestinal obstruction, unspecified as to partial versus complete obstruction: Secondary | ICD-10-CM

## 2012-11-08 DIAGNOSIS — R42 Dizziness and giddiness: Secondary | ICD-10-CM

## 2012-11-08 DIAGNOSIS — G8929 Other chronic pain: Secondary | ICD-10-CM | POA: Diagnosis present

## 2012-11-08 DIAGNOSIS — D638 Anemia in other chronic diseases classified elsewhere: Secondary | ICD-10-CM | POA: Diagnosis present

## 2012-11-08 DIAGNOSIS — E876 Hypokalemia: Secondary | ICD-10-CM | POA: Diagnosis present

## 2012-11-08 DIAGNOSIS — E785 Hyperlipidemia, unspecified: Secondary | ICD-10-CM | POA: Diagnosis present

## 2012-11-08 DIAGNOSIS — I672 Cerebral atherosclerosis: Secondary | ICD-10-CM | POA: Diagnosis present

## 2012-11-08 DIAGNOSIS — Z79899 Other long term (current) drug therapy: Secondary | ICD-10-CM | POA: Diagnosis not present

## 2012-11-08 DIAGNOSIS — I10 Essential (primary) hypertension: Secondary | ICD-10-CM | POA: Diagnosis present

## 2012-11-08 DIAGNOSIS — F0150 Vascular dementia without behavioral disturbance: Secondary | ICD-10-CM | POA: Diagnosis present

## 2012-11-08 DIAGNOSIS — F05 Delirium due to known physiological condition: Secondary | ICD-10-CM

## 2012-11-08 DIAGNOSIS — R4701 Aphasia: Secondary | ICD-10-CM | POA: Diagnosis present

## 2012-11-08 LAB — DIFFERENTIAL
Basophils Relative: 0 % (ref 0–1)
Eosinophils Absolute: 0 10*3/uL (ref 0.0–0.7)
Eosinophils Relative: 0 % (ref 0–5)
Neutrophils Relative %: 84 % — ABNORMAL HIGH (ref 43–77)

## 2012-11-08 LAB — URINALYSIS, ROUTINE W REFLEX MICROSCOPIC
Glucose, UA: NEGATIVE mg/dL
Leukocytes, UA: NEGATIVE
Nitrite: NEGATIVE
Protein, ur: NEGATIVE mg/dL

## 2012-11-08 LAB — COMPREHENSIVE METABOLIC PANEL
ALT: 17 U/L (ref 0–53)
Albumin: 4 g/dL (ref 3.5–5.2)
Alkaline Phosphatase: 60 U/L (ref 39–117)
BUN: 6 mg/dL (ref 6–23)
Calcium: 8.9 mg/dL (ref 8.4–10.5)
Potassium: 3.2 mEq/L — ABNORMAL LOW (ref 3.5–5.1)
Sodium: 120 mEq/L — ABNORMAL LOW (ref 135–145)
Total Protein: 6.7 g/dL (ref 6.0–8.3)

## 2012-11-08 LAB — PROTIME-INR
INR: 1.03 (ref 0.00–1.49)
Prothrombin Time: 13.4 seconds (ref 11.6–15.2)

## 2012-11-08 LAB — CBC
MCH: 32.9 pg (ref 26.0–34.0)
MCHC: 37 g/dL — ABNORMAL HIGH (ref 30.0–36.0)
Platelets: 256 10*3/uL (ref 150–400)

## 2012-11-08 LAB — LACTIC ACID, PLASMA: Lactic Acid, Venous: 2.9 mmol/L — ABNORMAL HIGH (ref 0.5–2.2)

## 2012-11-08 LAB — POCT I-STAT TROPONIN I: Troponin i, poc: 0 ng/mL (ref 0.00–0.08)

## 2012-11-08 LAB — TROPONIN I: Troponin I: <0.3 ng/mL (ref <0.30)

## 2012-11-08 MED ORDER — ONDANSETRON HCL 4 MG/2ML IJ SOLN
4.0000 mg | Freq: Once | INTRAMUSCULAR | Status: AC
  Administered 2012-11-09: 4 mg via INTRAVENOUS
  Filled 2012-11-08: qty 2

## 2012-11-08 MED ORDER — SODIUM CHLORIDE 0.9 % IV SOLN
INTRAVENOUS | Status: DC
  Administered 2012-11-09: via INTRAVENOUS

## 2012-11-08 NOTE — ED Provider Notes (Signed)
History    This chart was scribed for Sunnie Nielsen, MD by Leone Payor, ED Scribe. This patient was seen in room [facility]17[facility] and the patient's care was started 11:38 PM.   CSN: [id number]  Arrival date & time 11/08/12  2304   First MD Initiated Contact with Patient 11/08/12 2332      Chief Complaint  Patient presents with  . Altered Mental Status  . Emesis     The history is provided by a relative. The history is limited by the condition of the patient. No language interpreter was used.    HPI Comments: Larry Cox is a 79 y.o. male who presents to the Emergency Department complaining of altered mental status starting about 9 pm today. Daughter states pt had abdominal pain starting this afternoon which pt has at baseline. Daughter states pt was coherent at 1 pm today. Pt started vomiting pepto-bismol a few hours later. Pt started to gradually become more and more confused starting at about 9pm tonight. Pt bowels are also pink and look like pepto-bismol as well. He lives by himself.   No sudden onset symptoms, no unilateral deficits, level 5 caveat applies - PT unable to provide any reliable history  Past Medical History  Diagnosis Date  . Hyponatremia   . Small bowel obstruction   . Hearing deficit   . Arthritis   . Chronic back pain   . 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  . Anemia   . Dizziness   . Diverticulitis of colon 11/14/2011  . GERD (gastroesophageal reflux disease)     Past Surgical History  Procedure Laterality Date  . None    . No past surgeries    . Colonoscopy  04/08/2012    RMR: Pancolonic diverticulosis    Family History  Problem Relation Age of Onset  . Colon cancer Neg Hx     History  Substance Use Topics  . Smoking status: Former Smoker -- 0.30 packs/day    Types: Cigarettes  . Smokeless tobacco: Former Neurosurgeon    Quit date: 07/29/1958  . Alcohol Use: 0.6 oz/week    1 Cans of beer per week     Comment: 1 beer occasionally       Review of Systems  Unable to perform ROS: Mental status change  level 5 caveat   Allergies  Lorazepam  Home Medications   Current Outpatient Rx  Name  Route  Sig  Dispense  Refill  . aluminum & magnesium hydroxide (MAALOX) 225-200 MG/5ML suspension   Oral   Take 15 mLs by mouth daily.          Marland Kitchen aspirin EC 81 MG tablet   Oral   Take 81 mg by mouth 2 (two) times daily.          . fish oil-omega-3 fatty acids 1000 MG capsule   Oral   Take 1 g by mouth daily.           . haloperidol (HALDOL) 0.5 MG tablet   Oral   Take 1 tablet (0.5 mg total) by mouth every 6 (six) hours as needed. As directed   10 tablet   0   . HYDROcodone-acetaminophen (VICODIN) 5-500 MG per tablet   Oral   Take 1-2 tablets by mouth every 6 (six) hours as needed for pain.   15 tablet   0   . meclizine (ANTIVERT) 25 MG tablet   Oral   Take 25 mg by mouth  2 (two) times daily as needed. dizziness         . omeprazole (PRILOSEC) 20 MG capsule   Oral   Take 1 capsule (20 mg total) by mouth daily. 30 minutes before first meal of the day. For reflux.   30 capsule   3   . ondansetron (ZOFRAN) 4 MG tablet   Oral   Take 1 tablet (4 mg total) by mouth every 8 (eight) hours as needed for nausea.   30 tablet   1   . polyethylene glycol powder (GLYCOLAX/MIRALAX) powder   Oral   Take 17 g by mouth daily. For constipation   255 g   3     BP 163/85  Pulse 79  Temp(Src) 98.3 F (36.8 C) (Oral)  Resp 24  Ht 5\' 6"  (1.676 m)  Wt 160 lb (72.576 kg)  BMI 25.84 kg/m2  SpO2 99%  Physical Exam  Nursing note and vitals reviewed. Constitutional: He appears well-developed and well-nourished. No distress.  HENT:  Head: Normocephalic and atraumatic.  Eyes: EOM are normal.  Neck: Neck supple. No tracheal deviation present.  Cardiovascular: Normal rate.   Pulmonary/Chest: Effort normal. No respiratory distress.  Abdominal: He exhibits distension. There is tenderness.  Distended,  diffusely tender.   Genitourinary:  incontinent of urine.   Musculoskeletal: Normal range of motion.  Neurological:  Awake, alert, MAE x 4, does not follow commands  Skin: Skin is warm and dry.  Psychiatric: He has a normal mood and affect. His behavior is normal.    ED Course  Procedures (including critical care time)  DIAGNOSTIC STUDIES: Oxygen Saturation is 99% on room air, normal by my interpretation.    COORDINATION OF CARE: 12:00 AM Discussed treatment plan with pt at bedside and pt agreed to plan.   Results for orders placed during the hospital encounter of 11/08/12  PROTIME-INR      Result Value Range   Prothrombin Time 13.4  11.6 - 15.2 seconds   INR 1.03  0.00 - 1.49  APTT      Result Value Range   aPTT 28  24 - 37 seconds  CBC      Result Value Range   WBC 10.2  4.0 - 10.5 K/uL   RBC 3.86 (*) 4.22 - 5.81 MIL/uL   Hemoglobin 12.7 (*) 13.0 - 17.0 g/dL   HCT 16.1 (*) 09.6 - 04.5 %   MCV 88.9  78.0 - 100.0 fL   MCH 32.9  26.0 - 34.0 pg   MCHC 37.0 (*) 30.0 - 36.0 g/dL   RDW 40.9  81.1 - 91.4 %   Platelets 256  150 - 400 K/uL  DIFFERENTIAL      Result Value Range   Neutrophils Relative 84 (*) 43 - 77 %   Neutro Abs 8.5 (*) 1.7 - 7.7 K/uL   Lymphocytes Relative 7 (*) 12 - 46 %   Lymphs Abs 0.8  0.7 - 4.0 K/uL   Monocytes Relative 9  3 - 12 %   Monocytes Absolute 0.9  0.1 - 1.0 K/uL   Eosinophils Relative 0  0 - 5 %   Eosinophils Absolute 0.0  0.0 - 0.7 K/uL   Basophils Relative 0  0 - 1 %   Basophils Absolute 0.0  0.0 - 0.1 K/uL  COMPREHENSIVE METABOLIC PANEL      Result Value Range   Sodium 120 (*) 135 - 145 mEq/L   Potassium 3.2 (*) 3.5 - 5.1 mEq/L   Chloride  83 (*) 96 - 112 mEq/L   CO2 20  19 - 32 mEq/L   Glucose, Bld 113 (*) 70 - 99 mg/dL   BUN 6  6 - 23 mg/dL   Creatinine, Ser 9.62  0.50 - 1.35 mg/dL   Calcium 8.9  8.4 - 95.2 mg/dL   Total Protein 6.7  6.0 - 8.3 g/dL   Albumin 4.0  3.5 - 5.2 g/dL   AST 23  0 - 37 U/L   ALT 17  0 - 53 U/L    Alkaline Phosphatase 60  39 - 117 U/L   Total Bilirubin 0.6  0.3 - 1.2 mg/dL   GFR calc non Af Amer 77 (*) >90 mL/min   GFR calc Af Amer 89 (*) >90 mL/min  TROPONIN I      Result Value Range   Troponin I <0.30  <0.30 ng/mL  GLUCOSE, CAPILLARY      Result Value Range   Glucose-Capillary 113 (*) 70 - 99 mg/dL  LACTIC ACID, PLASMA      Result Value Range   Lactic Acid, Venous 2.9 (*) 0.5 - 2.2 mmol/L  URINALYSIS, ROUTINE W REFLEX MICROSCOPIC      Result Value Range   Color, Urine YELLOW  YELLOW   APPearance CLEAR  CLEAR   Specific Gravity, Urine <1.005 (*) 1.005 - 1.030   pH 7.0  5.0 - 8.0   Glucose, UA NEGATIVE  NEGATIVE mg/dL   Hgb urine dipstick NEGATIVE  NEGATIVE   Bilirubin Urine NEGATIVE  NEGATIVE   Ketones, ur TRACE (*) NEGATIVE mg/dL   Protein, ur NEGATIVE  NEGATIVE mg/dL   Urobilinogen, UA 0.2  0.0 - 1.0 mg/dL   Nitrite NEGATIVE  NEGATIVE   Leukocytes, UA NEGATIVE  NEGATIVE  POCT I-STAT TROPONIN I      Result Value Range   Troponin i, poc 0.00  0.00 - 0.08 ng/mL   Comment 3            Ct Head (brain) Wo Contrast  11/09/2012  *RADIOLOGY REPORT*  Clinical Data: Altered mental status.  CT HEAD WITHOUT CONTRAST  Technique:  Contiguous axial images were obtained from the base of the skull through the vertex without contrast.  Comparison: MRI and CT 11/14/2011  Findings: Again seen is the aneurysm at the left middle cerebral artery bifurcation.  This currently measures 6 mm, stable since prior MRI.  No visible hemorrhage.  Study was repeated multiple times due to patient motion.  Given the motion artifact, it is difficult to exclude acute infarct.  No hydrocephalus.  IMPRESSION: Extensive patient motion despite repeating study.  Difficult to exclude acute infarction.  No visible hemorrhage.  Stable left middle cerebral artery aneurysm.   Original Report Authenticated By: Charlett Nose, M.D.    Dg Abd Acute W/chest  11/09/2012  *RADIOLOGY REPORT*  Clinical Data: Emesis, altered  mental status  ACUTE ABDOMEN SERIES (ABDOMEN 2 VIEW & CHEST 1 VIEW)  Comparison: 04/28/2012  Findings: Linear opacity left lung base.  Heart size upper normal to mildly enlarged.  Bowel gas pattern nonspecific.  There are a few upper normal distended small bowel loops however gas is noted within normal caliber colon.  Curvature of the lumbar spine and diffuse osteopenia. No overt evidence for free intraperitoneal air on the upright view.  IMPRESSION: Nonspecific bowel gas pattern without overt obstruction.  Linear left lung base opacity, favor scarring.   Original Report Authenticated By: Jearld Lesch, M.D.      Date: 11/09/2012  Rate: 81  Rhythm: normal sinus rhythm  QRS Axis: normal  Intervals: QT prolonged  ST/T Wave abnormalities: nonspecific ST changes  Conduction Disutrbances:none  Narrative Interpretation:   Old EKG Reviewed: none available  IVFs, IV protonix, IV zofran  [facility] consult and admit to step down  MDM  AMS/ Hyponatremia presented with confusion, emesis and per family c/o ABD pain ealrier in the day.   ECG, labs, imaging and medications  MED admit    I personally performed the services described in this documentation, which was scribed in my presence. The recorded information has been reviewed and is accurate.     Sunnie Nielsen, MD 11/09/12 [phone]

## 2012-11-08 NOTE — ED Notes (Signed)
Patient from home, per EMS family states patient is normally alert and oriented but has been disoriented since earlier today. Patient unable to answer questions appropriately. Patient vomiting upon arrival to ED.

## 2012-11-09 ENCOUNTER — Inpatient Hospital Stay (HOSPITAL_COMMUNITY): Admit: 2012-11-09 | Payer: Medicare Other

## 2012-11-09 ENCOUNTER — Emergency Department (HOSPITAL_COMMUNITY): Payer: Medicare Other

## 2012-11-09 DIAGNOSIS — N323 Diverticulum of bladder: Secondary | ICD-10-CM | POA: Diagnosis not present

## 2012-11-09 DIAGNOSIS — R4701 Aphasia: Secondary | ICD-10-CM | POA: Diagnosis present

## 2012-11-09 DIAGNOSIS — R109 Unspecified abdominal pain: Secondary | ICD-10-CM

## 2012-11-09 DIAGNOSIS — E871 Hypo-osmolality and hyponatremia: Secondary | ICD-10-CM

## 2012-11-09 DIAGNOSIS — E872 Acidosis: Secondary | ICD-10-CM | POA: Diagnosis not present

## 2012-11-09 DIAGNOSIS — G9341 Metabolic encephalopathy: Secondary | ICD-10-CM | POA: Diagnosis not present

## 2012-11-09 DIAGNOSIS — G9340 Encephalopathy, unspecified: Secondary | ICD-10-CM | POA: Diagnosis present

## 2012-11-09 DIAGNOSIS — R112 Nausea with vomiting, unspecified: Secondary | ICD-10-CM

## 2012-11-09 DIAGNOSIS — E039 Hypothyroidism, unspecified: Secondary | ICD-10-CM | POA: Diagnosis present

## 2012-11-09 DIAGNOSIS — K5732 Diverticulitis of large intestine without perforation or abscess without bleeding: Secondary | ICD-10-CM

## 2012-11-09 DIAGNOSIS — I671 Cerebral aneurysm, nonruptured: Secondary | ICD-10-CM | POA: Diagnosis not present

## 2012-11-09 DIAGNOSIS — R252 Cramp and spasm: Secondary | ICD-10-CM | POA: Diagnosis present

## 2012-11-09 DIAGNOSIS — R1110 Vomiting, unspecified: Secondary | ICD-10-CM | POA: Diagnosis not present

## 2012-11-09 LAB — BASIC METABOLIC PANEL
BUN: 5 mg/dL — ABNORMAL LOW (ref 6–23)
BUN: 5 mg/dL — ABNORMAL LOW (ref 6–23)
CO2: 19 mEq/L (ref 19–32)
CO2: 20 mEq/L (ref 19–32)
CO2: 22 mEq/L (ref 19–32)
CO2: 22 mEq/L (ref 19–32)
Calcium: 9 mg/dL (ref 8.4–10.5)
Calcium: 8.6 mg/dL (ref 8.4–10.5)
Chloride: 84 mEq/L — ABNORMAL LOW (ref 96–112)
Chloride: 87 mEq/L — ABNORMAL LOW (ref 96–112)
Chloride: 88 mEq/L — ABNORMAL LOW (ref 96–112)
Chloride: 90 mEq/L — ABNORMAL LOW (ref 96–112)
Creatinine, Ser: 0.73 mg/dL (ref 0.50–1.35)
Creatinine, Ser: 0.72 mg/dL (ref 0.50–1.35)
GFR calc Af Amer: >90 mL/min (ref >90)
Glucose, Bld: 128 mg/dL — ABNORMAL HIGH (ref 70–99)
Glucose, Bld: 121 mg/dL — ABNORMAL HIGH (ref 70–99)
Glucose, Bld: 100 mg/dL — ABNORMAL HIGH (ref 70–99)
Potassium: 3.4 mEq/L — ABNORMAL LOW (ref 3.5–5.1)
Potassium: 3.7 mEq/L (ref 3.5–5.1)
Potassium: 3.9 mEq/L (ref 3.5–5.1)
Sodium: 125 mEq/L — ABNORMAL LOW (ref 135–145)

## 2012-11-09 LAB — MAGNESIUM: Magnesium: 1.8 mg/dL (ref 1.5–2.5)

## 2012-11-09 LAB — TSH: TSH: 2.492 u[IU]/mL (ref 0.350–4.500)

## 2012-11-09 LAB — T4, FREE: Free T4: 1.27 ng/dL (ref 0.80–1.80)

## 2012-11-09 LAB — OSMOLALITY: Osmolality: 249 mOsm/kg — CL (ref 275–300)

## 2012-11-09 LAB — OSMOLALITY, URINE: Osmolality, Ur: 249 mOsm/kg — ABNORMAL LOW (ref 390–1090)

## 2012-11-09 LAB — TROPONIN I: Troponin I: <0.3 ng/mL (ref <0.30)

## 2012-11-09 LAB — SODIUM, URINE, RANDOM
Sodium, Ur: 59 mEq/L
Sodium, Ur: 12 mEq/L

## 2012-11-09 LAB — MRSA PCR SCREENING: MRSA by PCR: NEGATIVE

## 2012-11-09 MED ORDER — HYDRALAZINE HCL 20 MG/ML IJ SOLN: 10.0000 mg | Freq: Four times a day (QID) | INTRAMUSCULAR | Status: DC | PRN

## 2012-11-09 MED ORDER — SODIUM CHLORIDE 3 % IV SOLN
100.0000 mL/h | INTRAVENOUS | Status: AC
  Filled 2012-11-09: qty 500

## 2012-11-09 MED ORDER — HYDROMORPHONE HCL PF 1 MG/ML IJ SOLN
0.5000 mg | Freq: Once | INTRAMUSCULAR | Status: AC
  Administered 2012-11-09: 0.5 mg via INTRAVENOUS
  Filled 2012-11-09: qty 1

## 2012-11-09 MED ORDER — QUETIAPINE FUMARATE 25 MG PO TABS
25.0000 mg | ORAL_TABLET | Freq: Two times a day (BID) | ORAL | Status: DC
  Administered 2012-11-09 (×2): 25 mg via ORAL
  Filled 2012-11-09 (×5): qty 1

## 2012-11-09 MED ORDER — SODIUM CHLORIDE 0.9 % IJ SOLN
3.0000 mL | Freq: Two times a day (BID) | INTRAMUSCULAR | Status: DC
  Administered 2012-11-09: 3 mL via INTRAVENOUS

## 2012-11-09 MED ORDER — ALBUTEROL SULFATE (5 MG/ML) 0.5% IN NEBU: 2.5000 mg | INHALATION_SOLUTION | RESPIRATORY_TRACT | Status: DC | PRN

## 2012-11-09 MED ORDER — SODIUM CHLORIDE 3 % IV SOLN
100.0000 mL/h | INTRAVENOUS | Status: DC
  Administered 2012-11-09: 100 mL/h via INTRAVENOUS
  Filled 2012-11-09: qty 500

## 2012-11-09 MED ORDER — MAGNESIUM SULFATE IN D5W 10-5 MG/ML-% IV SOLN
1.0000 g | Freq: Once | INTRAVENOUS | Status: AC
  Administered 2012-11-09: 1 g via INTRAVENOUS
  Filled 2012-11-09: qty 100

## 2012-11-09 MED ORDER — PANTOPRAZOLE SODIUM 40 MG IV SOLR
40.0000 mg | Freq: Once | INTRAVENOUS | Status: AC
  Administered 2012-11-09: 40 mg via INTRAVENOUS
  Filled 2012-11-09: qty 40

## 2012-11-09 MED ORDER — POTASSIUM CHLORIDE CRYS ER 20 MEQ PO TBCR: 40.0000 meq | EXTENDED_RELEASE_TABLET | ORAL | Status: DC | PRN

## 2012-11-09 MED ORDER — ENSURE COMPLETE PO LIQD
237.0000 mL | Freq: Three times a day (TID) | ORAL | Status: DC
  Administered 2012-11-09 – 2012-11-10 (×2): 237 mL via ORAL

## 2012-11-09 MED ORDER — POTASSIUM CHLORIDE 10 MEQ/100ML IV SOLN
10.0000 meq | Freq: Once | INTRAVENOUS | Status: AC
  Administered 2012-11-09: 10 meq via INTRAVENOUS
  Filled 2012-11-09: qty 100

## 2012-11-09 MED ORDER — SODIUM CHLORIDE 0.9 % IV SOLN: INTRAVENOUS | Status: DC

## 2012-11-09 MED ORDER — PANTOPRAZOLE SODIUM 40 MG IV SOLR
40.0000 mg | Freq: Two times a day (BID) | INTRAVENOUS | Status: DC
  Administered 2012-11-09: 40 mg via INTRAVENOUS
  Filled 2012-11-09: qty 40

## 2012-11-09 MED ORDER — SODIUM CHLORIDE 3 % IV SOLN
INTRAVENOUS | Status: AC
  Filled 2012-11-09: qty 500

## 2012-11-09 MED ORDER — HYDROMORPHONE HCL PF 1 MG/ML IJ SOLN: 0.5000 mg | INTRAMUSCULAR | Status: DC | PRN

## 2012-11-09 MED ORDER — HALOPERIDOL LACTATE 5 MG/ML IJ SOLN
1.0000 mg | Freq: Four times a day (QID) | INTRAMUSCULAR | Status: DC | PRN
  Administered 2012-11-09: 1 mg via INTRAVENOUS
  Filled 2012-11-09 (×2): qty 1

## 2012-11-09 MED ORDER — DIAZEPAM 5 MG/ML IJ SOLN: 2.5000 mg | INTRAMUSCULAR | Status: DC | PRN

## 2012-11-09 MED ORDER — SODIUM CHLORIDE 0.9 % IV SOLN
INTRAVENOUS | Status: DC
  Administered 2012-11-09: 18:00:00 via INTRAVENOUS

## 2012-11-09 MED ORDER — PANTOPRAZOLE SODIUM 40 MG PO TBEC
40.0000 mg | DELAYED_RELEASE_TABLET | Freq: Two times a day (BID) | ORAL | Status: DC
  Administered 2012-11-09 – 2012-11-10 (×2): 40 mg via ORAL
  Filled 2012-11-09 (×2): qty 1

## 2012-11-09 MED ORDER — HALOPERIDOL LACTATE 5 MG/ML IJ SOLN: 2.0000 mg | Freq: Four times a day (QID) | INTRAMUSCULAR | Status: DC | PRN

## 2012-11-09 MED ORDER — FUROSEMIDE 10 MG/ML IJ SOLN
20.0000 mg | Freq: Two times a day (BID) | INTRAMUSCULAR | Status: DC
  Administered 2012-11-09 – 2012-11-10 (×2): 20 mg via INTRAVENOUS
  Filled 2012-11-09 (×2): qty 2

## 2012-11-09 NOTE — Clinical Social Work Note (Signed)
CSW received consult, reviewed chart.  At present, patient is confused and agitated, not appropriate for assessment.  Will await PT recommendation on level of care anticipated at discharge, CSW will stand by to assist w placement when appropriate.  Santa Genera, LCSW Clinical Social Worker [phone])

## 2012-11-09 NOTE — Progress Notes (Signed)
PT Cancellation Note  Patient Details Name: Larry Cox MRN: [id number] DOB: Jun 25, 1922   Cancelled Treatment:    Reason Eval/Treat Not Completed: Other (comment) Pt is very confused and agitated.  Not appropriate for PT today.  Will try again tomorrow.  Konrad Penta 11/09/2012, 9:49 AM

## 2012-11-09 NOTE — Consult Note (Signed)
Reason for Consult: Hyponatremia Referring Physician: Dr. Curlene Labrum  Larry Cox is an 79 y.o. male.  HPI: He is a patient was history of dementia, history of small bowel obstruction and cerebral aneurysm presently brought by family is because of altered mental status. According to his daughter patient was having loss of nausea and abdominal pain. He called his family is because she was feeling sick. When the we'll see him patient was found to have been drinking loss of water and complaint of nausea abdominal pain with acute mental status change. Patient daughter states that  is the first time to this happens urine though patient has previous history of hyponatremia and I was advised not to drink as of water. Presently patient is alert talking and according to his daughter this is baseline.   Past Medical History  Diagnosis Date  . Hyponatremia   . Small bowel obstruction   . Hearing deficit   . Arthritis   . Chronic back pain   . 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  . Anemia   . Dizziness   . Diverticulitis of colon 11/14/2011  . GERD (gastroesophageal reflux disease)     Past Surgical History  Procedure Laterality Date  . None    . No past surgeries    . Colonoscopy  04/08/2012    RMR: Pancolonic diverticulosis    Family History  Problem Relation Age of Onset  . Colon cancer Neg Hx     Social History:  reports that he has quit smoking. His smoking use included Cigarettes. He smoked 0.30 packs per day. He quit smokeless tobacco use about 54 years ago. He reports that he drinks about 0.6 ounces of alcohol per week. He reports that he does not use illicit drugs.  Allergies:  Allergies  Allergen Reactions  . Aricept (Donepezil Hcl)   . Lorazepam Other (See Comments)    Hallucinations, Violent Behavior    Medications: I have reviewed the patient's current medications.  Results for orders placed during the hospital encounter of 11/08/12 (from the past 48  hour(s))  PROTIME-INR     Status: None   Collection Time    11/08/12 11:11 PM      Result Value Range   Prothrombin Time 13.4  11.6 - 15.2 seconds   INR 1.03  0.00 - 1.49  APTT     Status: None   Collection Time    11/08/12 11:11 PM      Result Value Range   aPTT 28  24 - 37 seconds  CBC     Status: Abnormal   Collection Time    11/08/12 11:11 PM      Result Value Range   WBC 10.2  4.0 - 10.5 K/uL   RBC 3.86 (*) 4.22 - 5.81 MIL/uL   Hemoglobin 12.7 (*) 13.0 - 17.0 g/dL   HCT 21.3 (*) 08.6 - 57.8 %   MCV 88.9  78.0 - 100.0 fL   MCH 32.9  26.0 - 34.0 pg   MCHC 37.0 (*) 30.0 - 36.0 g/dL   RDW 46.9  62.9 - 52.8 %   Platelets 256  150 - 400 K/uL  DIFFERENTIAL     Status: Abnormal   Collection Time    11/08/12 11:11 PM      Result Value Range   Neutrophils Relative % 84 (*) 43 - 77 %   Neutro Abs 8.5 (*) 1.7 - 7.7 K/uL   Lymphocytes Relative 7 (*)  12 - 46 %   Lymphs Abs 0.8  0.7 - 4.0 K/uL   Monocytes Relative 9  3 - 12 %   Monocytes Absolute 0.9  0.1 - 1.0 K/uL   Eosinophils Relative 0  0 - 5 %   Eosinophils Absolute 0.0  0.0 - 0.7 K/uL   Basophils Relative 0  0 - 1 %   Basophils Absolute 0.0  0.0 - 0.1 K/uL  COMPREHENSIVE METABOLIC PANEL     Status: Abnormal   Collection Time    11/08/12 11:11 PM      Result Value Range   Sodium 120 (*) 135 - 145 mEq/L   Potassium 3.2 (*) 3.5 - 5.1 mEq/L   Chloride 83 (*) 96 - 112 mEq/L   CO2 20  19 - 32 mEq/L   Glucose, Bld 113 (*) 70 - 99 mg/dL   BUN 6  6 - 23 mg/dL   Creatinine, Ser 1.61  0.50 - 1.35 mg/dL   Calcium 8.9  8.4 - 09.6 mg/dL   Total Protein 6.7  6.0 - 8.3 g/dL   Albumin 4.0  3.5 - 5.2 g/dL   AST 23  0 - 37 U/L   ALT 17  0 - 53 U/L   Alkaline Phosphatase 60  39 - 117 U/L   Total Bilirubin 0.6  0.3 - 1.2 mg/dL   GFR calc non Af Amer 77 (*) >90 mL/min   GFR calc Af Amer 89 (*) >90 mL/min   Comment:            The eGFR has been calculated     using the CKD EPI equation.     This calculation has not been      validated in all clinical     situations.     eGFR's persistently     <90 mL/min signify     possible Chronic Kidney Disease.  TROPONIN I     Status: None   Collection Time    11/08/12 11:11 PM      Result Value Range   Troponin I <0.30  <0.30 ng/mL   Comment:            Due to the release kinetics of cTnI,     a negative result within the first hours     of the onset of symptoms does not rule out     myocardial infarction with certainty.     If myocardial infarction is still suspected,     repeat the test at appropriate intervals.  GLUCOSE, CAPILLARY     Status: Abnormal   Collection Time    11/08/12 11:24 PM      Result Value Range   Glucose-Capillary 113 (*) 70 - 99 mg/dL  LACTIC ACID, PLASMA     Status: Abnormal   Collection Time    11/08/12 11:26 PM      Result Value Range   Lactic Acid, Venous 2.9 (*) 0.5 - 2.2 mmol/L  POCT I-STAT TROPONIN I     Status: None   Collection Time    11/08/12 11:33 PM      Result Value Range   Troponin i, poc 0.00  0.00 - 0.08 ng/mL   Comment 3            Comment: Due to the release kinetics of cTnI,     a negative result within the first hours     of the onset of symptoms does not rule out  myocardial infarction with certainty.     If myocardial infarction is still suspected,     repeat the test at appropriate intervals.  URINALYSIS, ROUTINE W REFLEX MICROSCOPIC     Status: Abnormal   Collection Time    11/08/12 11:50 PM      Result Value Range   Color, Urine YELLOW  YELLOW   APPearance CLEAR  CLEAR   Specific Gravity, Urine <1.005 (*) 1.005 - 1.030   pH 7.0  5.0 - 8.0   Glucose, UA NEGATIVE  NEGATIVE mg/dL   Hgb urine dipstick NEGATIVE  NEGATIVE   Bilirubin Urine NEGATIVE  NEGATIVE   Ketones, ur TRACE (*) NEGATIVE mg/dL   Protein, ur NEGATIVE  NEGATIVE mg/dL   Urobilinogen, UA 0.2  0.0 - 1.0 mg/dL   Nitrite NEGATIVE  NEGATIVE   Leukocytes, UA NEGATIVE  NEGATIVE   Comment: MICROSCOPIC NOT DONE ON URINES WITH NEGATIVE  PROTEIN, BLOOD, LEUKOCYTES, NITRITE, OR GLUCOSE <1000 mg/dL.  SODIUM, URINE, RANDOM     Status: None   Collection Time    11/08/12 11:50 PM      Result Value Range   Sodium, Ur 59    BASIC METABOLIC PANEL     Status: Abnormal   Collection Time    11/09/12  3:03 AM      Result Value Range   Sodium 119 (*) 135 - 145 mEq/L   Comment: CRITICAL RESULT CALLED TO, READ BACK BY AND VERIFIED WITH:     WAGONER R AT 0337 ON [id number] BY FORSYTH K   Potassium 3.6  3.5 - 5.1 mEq/L   Chloride 84 (*) 96 - 112 mEq/L   CO2 19  19 - 32 mEq/L   Glucose, Bld 128 (*) 70 - 99 mg/dL   BUN 5 (*) 6 - 23 mg/dL   Creatinine, Ser 9.14  0.50 - 1.35 mg/dL   Calcium 9.0  8.4 - 78.2 mg/dL   GFR calc non Af Amer 80 (*) >90 mL/min   GFR calc Af Amer >90  >90 mL/min   Comment:            The eGFR has been calculated     using the CKD EPI equation.     This calculation has not been     validated in all clinical     situations.     eGFR's persistently     <90 mL/min signify     possible Chronic Kidney Disease.  MRSA PCR SCREENING     Status: None   Collection Time    11/09/12  3:31 AM      Result Value Range   MRSA by PCR NEGATIVE  NEGATIVE   Comment:            The GeneXpert MRSA Assay (FDA     approved for NASAL specimens     only), is one component of a     comprehensive MRSA colonization     surveillance program. It is not     intended to diagnose MRSA     infection nor to guide or     monitor treatment for     MRSA infections.  BASIC METABOLIC PANEL     Status: Abnormal   Collection Time    11/09/12  5:28 AM      Result Value Range   Sodium 122 (*) 135 - 145 mEq/L   Potassium 3.4 (*) 3.5 - 5.1 mEq/L   Chloride 87 (*) 96 - 112 mEq/L  CO2 20  19 - 32 mEq/L   Glucose, Bld 131 (*) 70 - 99 mg/dL   BUN 5 (*) 6 - 23 mg/dL   Creatinine, Ser 0.45  0.50 - 1.35 mg/dL   Calcium 8.7  8.4 - 40.9 mg/dL   GFR calc non Af Amer 79 (*) >90 mL/min   GFR calc Af Amer >90  >90 mL/min   Comment:            The  eGFR has been calculated     using the CKD EPI equation.     This calculation has not been     validated in all clinical     situations.     eGFR's persistently     <90 mL/min signify     possible Chronic Kidney Disease.  MAGNESIUM     Status: None   Collection Time    11/09/12  5:28 AM      Result Value Range   Magnesium 1.7  1.5 - 2.5 mg/dL  PRO B NATRIURETIC PEPTIDE     Status: Abnormal   Collection Time    11/09/12  5:28 AM      Result Value Range   Pro B Natriuretic peptide (BNP) 1425.0 (*) 0 - 450 pg/mL  BASIC METABOLIC PANEL     Status: Abnormal   Collection Time    11/09/12  7:26 AM      Result Value Range   Sodium 122 (*) 135 - 145 mEq/L   Potassium 3.4 (*) 3.5 - 5.1 mEq/L   Chloride 88 (*) 96 - 112 mEq/L   CO2 22  19 - 32 mEq/L   Glucose, Bld 119 (*) 70 - 99 mg/dL   BUN 5 (*) 6 - 23 mg/dL   Creatinine, Ser 8.11  0.50 - 1.35 mg/dL   Calcium 8.8  8.4 - 91.4 mg/dL   GFR calc non Af Amer 80 (*) >90 mL/min   GFR calc Af Amer >90  >90 mL/min   Comment:            The eGFR has been calculated     using the CKD EPI equation.     This calculation has not been     validated in all clinical     situations.     eGFR's persistently     <90 mL/min signify     possible Chronic Kidney Disease.  TROPONIN I     Status: None   Collection Time    11/09/12  7:26 AM      Result Value Range   Troponin I <0.30  <0.30 ng/mL   Comment:            Due to the release kinetics of cTnI,     a negative result within the first hours     of the onset of symptoms does not rule out     myocardial infarction with certainty.     If myocardial infarction is still suspected,     repeat the test at appropriate intervals.  BASIC METABOLIC PANEL     Status: Abnormal   Collection Time    11/09/12  9:37 AM      Result Value Range   Sodium 125 (*) 135 - 145 mEq/L   Potassium 3.7  3.5 - 5.1 mEq/L   Chloride 90 (*) 96 - 112 mEq/L   CO2 22  19 - 32 mEq/L   Glucose, Bld 121 (*) 70 - 99 mg/dL    BUN  5 (*) 6 - 23 mg/dL   Creatinine, Ser 3.08  0.50 - 1.35 mg/dL   Calcium 8.6  8.4 - 65.7 mg/dL   GFR calc non Af Amer 79 (*) >90 mL/min   GFR calc Af Amer >90  >90 mL/min   Comment:            The eGFR has been calculated     using the CKD EPI equation.     This calculation has not been     validated in all clinical     situations.     eGFR's persistently     <90 mL/min signify     possible Chronic Kidney Disease.  MAGNESIUM     Status: None   Collection Time    11/09/12  9:37 AM      Result Value Range   Magnesium 1.8  1.5 - 2.5 mg/dL  BASIC METABOLIC PANEL     Status: Abnormal   Collection Time    11/09/12  1:42 PM      Result Value Range   Sodium 124 (*) 135 - 145 mEq/L   Potassium 3.9  3.5 - 5.1 mEq/L   Chloride 90 (*) 96 - 112 mEq/L   CO2 22  19 - 32 mEq/L   Glucose, Bld 100 (*) 70 - 99 mg/dL   BUN 5 (*) 6 - 23 mg/dL   Creatinine, Ser 8.46  0.50 - 1.35 mg/dL   Calcium 9.1  8.4 - 96.2 mg/dL   GFR calc non Af Amer 79 (*) >90 mL/min   GFR calc Af Amer >90  >90 mL/min   Comment:            The eGFR has been calculated     using the CKD EPI equation.     This calculation has not been     validated in all clinical     situations.     eGFR's persistently     <90 mL/min signify     possible Chronic Kidney Disease.    Ct Abdomen Pelvis Wo Contrast  11/09/2012  *RADIOLOGY REPORT*  Clinical Data: Abdominal pain  CT ABDOMEN AND PELVIS WITHOUT CONTRAST  Technique:  Multidetector CT imaging of the abdomen and pelvis was performed following the standard protocol without intravenous contrast.  Comparison: 11/09/2012 radiograph, 04/17/2012 CT  Findings: Linear left lung base opacity, favor atelectasis or scarring.  Coronary artery and aortic valve calcification.  Organ abnormality/lesion detection is limited in the absence of intravenous contrast. Within this limitation, rounded hypodensity within the right hepatic lobe measuring 2.2 cm, similar to prior. No new hepatic lesions.   Unremarkable spleen, pancreas, biliary system, adrenal glands.  Lobular renal contours.  No hydronephrosis.  The distal ureters are mildly dilated to the level of the thickened bladder, showing multiple bladder diverticula.  Advanced colonic diverticulosis.  No overt evidence for acute diverticulitis.  Normal appendix.  Small bowel loops are normal course and caliber.  Small hiatal hernia.  No free intraperitoneal air or fluid.  No lymphadenopathy.  There is scattered atherosclerotic calcification of the aorta and its branches. No aneurysmal dilatation.  Fat containing left greater than right inguinal hernias. Prostatomegaly.  Osteopenia and multilevel degenerative changes.  Leftward curvature of the lumbar spine.  IMPRESSION: Circumferential bladder wall thickening and multiple diverticula, likely secondary to chronic partial outlet obstruction in the setting of prostatomegaly.  Correlate with urinalysis if concerned for superimposed infection.  Advanced colonic diverticulosis without overt diverticulitis.   Original Report Authenticated  By: Jearld Lesch, M.D.    Ct Head (brain) Wo Contrast  11/09/2012  *RADIOLOGY REPORT*  Clinical Data: Altered mental status.  CT HEAD WITHOUT CONTRAST  Technique:  Contiguous axial images were obtained from the base of the skull through the vertex without contrast.  Comparison: MRI and CT 11/14/2011  Findings: Again seen is the aneurysm at the left middle cerebral artery bifurcation.  This currently measures 6 mm, stable since prior MRI.  No visible hemorrhage.  Study was repeated multiple times due to patient motion.  Given the motion artifact, it is difficult to exclude acute infarct.  No hydrocephalus.  IMPRESSION: Extensive patient motion despite repeating study.  Difficult to exclude acute infarction.  No visible hemorrhage.  Stable left middle cerebral artery aneurysm.   Original Report Authenticated By: Charlett Nose, M.D.    Dg Abd Acute W/chest  11/09/2012   *RADIOLOGY REPORT*  Clinical Data: Emesis, altered mental status  ACUTE ABDOMEN SERIES (ABDOMEN 2 VIEW & CHEST 1 VIEW)  Comparison: 04/28/2012  Findings: Linear opacity left lung base.  Heart size upper normal to mildly enlarged.  Bowel gas pattern nonspecific.  There are a few upper normal distended small bowel loops however gas is noted within normal caliber colon.  Curvature of the lumbar spine and diffuse osteopenia. No overt evidence for free intraperitoneal air on the upright view.  IMPRESSION: Nonspecific bowel gas pattern without overt obstruction.  Linear left lung base opacity, favor scarring.   Original Report Authenticated By: Jearld Lesch, M.D.     Review of Systems  Respiratory: Negative for shortness of breath.   Cardiovascular: Negative for PND.  Gastrointestinal: Positive for nausea, vomiting and abdominal pain. Negative for diarrhea.  Neurological: Positive for weakness.  Psychiatric/Behavioral: Positive for memory loss.   Blood pressure 156/82, pulse 67, temperature 98.5 F (36.9 C), temperature source Oral, resp. rate 17, height 5\' 9"  (1.753 m), weight 68.2 kg (150 lb 5.7 oz), SpO2 97.00%. Physical Exam  Eyes: No scleral icterus.  Neck: No JVD present.  Cardiovascular: Normal rate and regular rhythm.   Respiratory: No respiratory distress. He has no rales.  GI: He exhibits no distension. There is no tenderness.  Musculoskeletal: He exhibits no edema.    Assessment/Plan: Problem #1 hyponatremia presently seems to be multifactorial. Patient euvolemic with history of nausea and abdominal pain which is a setup for SIADH and uncontrolled water intake. Hence possibly might have SIADH. However the presence of hypokalemia and the previous improvement of his hyponatremia with hydration indicate most likely this is hypovolemic hyponatremia with hypo-tonic fluid replacement. Presently patient mental status has returned to his baseline. Problem #2 hypokalemia patient on potassium  supplement corrected Problem #3 history of hypertension Problem #4 history of dementia Problem #5 history of charity Problem #6 anemia Problem #7 history of diverticulitis. Plan: I agree with freewater restriction We'll DC by mouth potassium We'll start him on normal saline with 20 current KCl at 60 cc per hour We'll check urine osmolality and serum osmolality today.   Sostenes Kauffmann S 11/09/2012, 5:39 PM

## 2012-11-09 NOTE — Progress Notes (Signed)
UR Chart Review Completed  

## 2012-11-09 NOTE — Progress Notes (Signed)
CRITICAL VALUE ALERT  Critical value received:  Urine osmolality 249  Date of notification:  11/09/12  Time of notification:  1805  Critical value read back: yes  Nurse who received alert:  Lovena Neighbours RN  MD notified (1st page): Dr Burney Gauze  Time of first page:  1806

## 2012-11-09 NOTE — Progress Notes (Signed)
[facility]                                                                                Patient Demographics  Larry Cox, is a 79 y.o. male, DOB - 1921/08/21, WGN:[id number], HQI:[id number]  Admit date - 11/08/2012  Admitting Physician Edsel Petrin, DO  Outpatient Primary MD for the patient is Carylon Perches, MD  LOS - 1   Chief Complaint  Patient presents with  . Altered Mental Status  . Emesis        Assessment & Plan    1.  Acute encephalopathy- likely related to hyponatremia, head CT is unremarkable, no focal deficits or headache, continue supportive care, we'll request neuro to evaluate, he could have some underlying early dementia also, we'll try to correct hyponatremia gradually and monitor his neurological status.   2. Hyponatremia. Previously diagnosed with SIADH/primary polydipsia, however there was some history of nausea vomiting also, urine sodium is close to 60, urine and serum os banality are pending, right now fluid restriction, renal to evaluate, note he received hypertonic saline bolus one time in the ER. We'll monitor sodium closely.   3. Chronic abdominal pain along with nausea vomiting, mildly elevated lactic acid level. Abdominal exam is benign, currently he does not complain of any abdominal pain, however it seems like his nausea vomiting has been a chronic and recurrent problem, I will place him on IV PPI and request GI to evaluate the patient. Active Problems:     4. Anemia of chronic disease. No acute issues we will monitor and try to keep hemoglobin above 8.    5. Hypertension, likely secondary to agitation, ER and IV hydralazine and monitor.    Code Status: full  Family Communication: daughter  Disposition Plan: TBD   Procedures CT Abd Pelvis, CT Head   Consults  Renal, GI, neurology   DVT Prophylaxis    SCDs   Lab Results  Component Value Date   PLT 256 11/08/2012    Medications  Scheduled Meds: . magnesium  sulfate 1 - 4 g bolus IVPB  1 g Intravenous Once  . QUEtiapine  25 mg Oral BID  . sodium chloride  3 mL Intravenous Q12H   Continuous Infusions:  PRN Meds:.albuterol, diazepam, haloperidol lactate, HYDROmorphone (DILAUDID) injection  Antibiotics     Anti-infectives   None       Time Spent in minutes   45   Susa Raring K M.D on 11/09/2012 at 9:31 AM  Between 7am to 7pm - Pager - [phone]  After 7pm go to [email or web address] - password [email or web address]  And look for the night coverage person covering for me after hours  [facility] Office  [phone]    Subjective:   Larry Cox today has, No headache, No chest pain, No abdominal pain - No Nausea, No new weakness tingling or numbness, No Cough - SOB. Says no to pretty much everything.  Objective:   Filed Vitals:   11/09/12 0500 11/09/12 0600 11/09/12 0700 11/09/12 0800  BP: 199/170 156/82    Pulse:      Temp:      TempSrc:      Resp: 20 26 20  20  Height:      Weight:      SpO2:        Wt Readings from Last 3 Encounters:  11/09/12 68.2 kg (150 lb 5.7 oz)  04/29/12 69.9 kg (154 lb 1.6 oz)  04/17/12 72.576 kg (160 lb)     Intake/Output Summary (Last 24 hours) at 11/09/12 0931 Last data filed at 11/08/12 2357  Gross per 24 hour  Intake      0 ml  Output    110 ml  Net   -110 ml    Exam Awake  ,but not alert. No new F.N deficits  [place].AT,PERRAL Supple Neck,No JVD, No cervical lymphadenopathy appriciated.  Symmetrical Chest wall movement, Good air movement bilaterally, CTAB RRR,No Gallops,Rubs or new Murmurs, No Parasternal Heave +ve B.Sounds, Abd Soft, Non tender, No organomegaly appriciated, No rebound - guarding or rigidity. No Cyanosis, Clubbing or edema, No new Rash or bruise     Data Review   Micro Results Recent Results (from the past 240 hour(s))  MRSA PCR SCREENING     Status: None   Collection Time    11/09/12  3:31 AM      Result Value Range Status   MRSA by PCR NEGATIVE  NEGATIVE  Final   Comment:            The GeneXpert MRSA Assay (FDA     approved for NASAL specimens     only), is one component of a     comprehensive MRSA colonization     surveillance program. It is not     intended to diagnose MRSA     infection nor to guide or     monitor treatment for     MRSA infections.    Radiology Reports Ct Abdomen Pelvis Wo Contrast  11/09/2012  *RADIOLOGY REPORT*  Clinical Data: Abdominal pain  CT ABDOMEN AND PELVIS WITHOUT CONTRAST  Technique:  Multidetector CT imaging of the abdomen and pelvis was performed following the standard protocol without intravenous contrast.  Comparison: 11/09/2012 radiograph, 04/17/2012 CT  Findings: Linear left lung base opacity, favor atelectasis or scarring.  Coronary artery and aortic valve calcification.  Organ abnormality/lesion detection is limited in the absence of intravenous contrast. Within this limitation, rounded hypodensity within the right hepatic lobe measuring 2.2 cm, similar to prior. No new hepatic lesions.  Unremarkable spleen, pancreas, biliary system, adrenal glands.  Lobular renal contours.  No hydronephrosis.  The distal ureters are mildly dilated to the level of the thickened bladder, showing multiple bladder diverticula.  Advanced colonic diverticulosis.  No overt evidence for acute diverticulitis.  Normal appendix.  Small bowel loops are normal course and caliber.  Small hiatal hernia.  No free intraperitoneal air or fluid.  No lymphadenopathy.  There is scattered atherosclerotic calcification of the aorta and its branches. No aneurysmal dilatation.  Fat containing left greater than right inguinal hernias. Prostatomegaly.  Osteopenia and multilevel degenerative changes.  Leftward curvature of the lumbar spine.  IMPRESSION: Circumferential bladder wall thickening and multiple diverticula, likely secondary to chronic partial outlet obstruction in the setting of prostatomegaly.  Correlate with urinalysis if concerned for  superimposed infection.  Advanced colonic diverticulosis without overt diverticulitis.   Original Report Authenticated By: Jearld Lesch, M.D.    Ct Head (brain) Wo Contrast  11/09/2012  *RADIOLOGY REPORT*  Clinical Data: Altered mental status.  CT HEAD WITHOUT CONTRAST  Technique:  Contiguous axial images were obtained from the base of the skull through the vertex without contrast.  Comparison: MRI and CT 11/14/2011  Findings: Again seen is the aneurysm at the left middle cerebral artery bifurcation.  This currently measures 6 mm, stable since prior MRI.  No visible hemorrhage.  Study was repeated multiple times due to patient motion.  Given the motion artifact, it is difficult to exclude acute infarct.  No hydrocephalus.  IMPRESSION: Extensive patient motion despite repeating study.  Difficult to exclude acute infarction.  No visible hemorrhage.  Stable left middle cerebral artery aneurysm.   Original Report Authenticated By: Charlett Nose, M.D.    Dg Abd Acute W/chest  11/09/2012  *RADIOLOGY REPORT*  Clinical Data: Emesis, altered mental status  ACUTE ABDOMEN SERIES (ABDOMEN 2 VIEW & CHEST 1 VIEW)  Comparison: 04/28/2012  Findings: Linear opacity left lung base.  Heart size upper normal to mildly enlarged.  Bowel gas pattern nonspecific.  There are a few upper normal distended small bowel loops however gas is noted within normal caliber colon.  Curvature of the lumbar spine and diffuse osteopenia. No overt evidence for free intraperitoneal air on the upright view.  IMPRESSION: Nonspecific bowel gas pattern without overt obstruction.  Linear left lung base opacity, favor scarring.   Original Report Authenticated By: Jearld Lesch, M.D.     CBC  Recent Labs Lab 11/08/12 2311  WBC 10.2  HGB 12.7*  HCT 34.3*  PLT 256  MCV 88.9  MCH 32.9  MCHC 37.0*  RDW 13.2  LYMPHSABS 0.8  MONOABS 0.9  EOSABS 0.0  BASOSABS 0.0    Chemistries   Recent Labs Lab 11/08/12 2311 11/09/12 0303  11/09/12 0528 11/09/12 0726  NA 120* 119* 122* 122*  K 3.2* 3.6 3.4* 3.4*  CL 83* 84* 87* 88*  CO2 20 19 20 22   GLUCOSE 113* 128* 131* 119*  BUN 6 5* 5* 5*  CREATININE 0.80 0.73 0.74 0.72  CALCIUM 8.9 9.0 8.7 8.8  MG  --   --  1.7  --   AST 23  --   --   --   ALT 17  --   --   --   ALKPHOS 60  --   --   --   BILITOT 0.6  --   --   --    ------------------------------------------------------------------------------------------------------------------ estimated creatinine clearance is 60.4 ml/min (by C-G formula based on Cr of 0.72). ------------------------------------------------------------------------------------------------------------------ No results found for this basename: HGBA1C,  in the last 72 hours ------------------------------------------------------------------------------------------------------------------ No results found for this basename: CHOL, HDL, LDLCALC, TRIG, CHOLHDL, LDLDIRECT,  in the last 72 hours ------------------------------------------------------------------------------------------------------------------ No results found for this basename: TSH, T4TOTAL, FREET3, T3FREE, THYROIDAB,  in the last 72 hours ------------------------------------------------------------------------------------------------------------------ No results found for this basename: VITAMINB12, FOLATE, FERRITIN, TIBC, IRON, RETICCTPCT,  in the last 72 hours  Coagulation profile  Recent Labs Lab 11/08/12 2311  INR 1.03    No results found for this basename: DDIMER,  in the last 72 hours  Cardiac Enzymes  Recent Labs Lab 11/08/12 2311 11/09/12 0726  TROPONINI <0.30 <0.30   ------------------------------------------------------------------------------------------------------------------ No components found with this basename: POCBNP,

## 2012-11-09 NOTE — H&P (Signed)
[facility] History and Physical  Larry Cox ZOX:[id number] DOB: 04/04/1922 DOA: 11/08/2012  Referring physician: EDP Dierdre Highman PCP: Carylon Perches, MD   Specialists: none  Chief Complaint: AMS, Nausea Vomiting  HPI: Larry Cox is a 78 y.o. male with multiple chronic medical problems including chronic abdominal pain associated with nausea and vomiting for which she has undergone an extensive evaluation without a clear etiology being identified, early vascular dementia, recurrent diverticulitis, and anemia who was brought into the emergency department via EMS after he developed acute mental status changes that included profound confusion, agitation, inability to walk, and difficulty with speaking and communication. His acute change was preceded by about 12 hours of weakness, abdominal pain and nausea and vomiting. He had apparently called his daughter this morning to say he "did not feel well" but he continued to decline throughout the afternooon. When his son arrived at his house he found his father confused and groaning in pain. He noticed that there were excessive amount of water bottles present per the daughter, the patient had been instructed on prior hospitalizations to restrict his fluid intake, he has had a chronic hyponatremia for the last few months felt to be from SIADH.   Review of Systems: Unable to obtain from patient, his daughter gives the history. She is at bedside.  Past Medical History  Diagnosis Date  . Hyponatremia   . Small bowel obstruction   . Hearing deficit   . Arthritis   . Chronic back pain   . 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  . Anemia   . Dizziness   . Diverticulitis of colon 11/14/2011  . GERD (gastroesophageal reflux disease)    Past Surgical History  Procedure Laterality Date  . None    . No past surgeries    . Colonoscopy  04/08/2012    RMR: Pancolonic diverticulosis   Social History:  reports that he has quit smoking. His  smoking use included Cigarettes. He smoked 0.30 packs per day. He quit smokeless tobacco use about 54 years ago. He reports that he drinks about 0.6 ounces of alcohol per week. He reports that he does not use illicit drugs. Lives independently his children check in on him frequently.  Allergies  Allergen Reactions  . Aricept (Donepezil Hcl)   . Lorazepam Other (See Comments)    Hallucinations, Violent Behavior    Family History  Problem Relation Age of Onset  . Colon cancer Neg Hx     Prior to Admission medications   Medication Sig Start Date End Date Taking? Authorizing Provider  levothyroxine (SYNTHROID, LEVOTHROID) 75 MCG tablet Take 75 mcg by mouth daily before breakfast.   Yes Historical Provider, MD  traMADol (ULTRAM) 50 MG tablet Take 50 mg by mouth 2 (two) times daily.   Yes Historical Provider, MD  aluminum & magnesium hydroxide (MAALOX) 225-200 MG/5ML suspension Take 15 mLs by mouth daily.     Historical Provider, MD  aspirin EC 81 MG tablet Take 81 mg by mouth 2 (two) times daily.     Historical Provider, MD  fish oil-omega-3 fatty acids 1000 MG capsule Take 1 g by mouth daily.      Historical Provider, MD  HYDROcodone-acetaminophen (VICODIN) 5-500 MG per tablet Take 1-2 tablets by mouth every 6 (six) hours as needed for pain. 03/23/12   Johnney Ou, MD  meclizine (ANTIVERT) 25 MG tablet Take 25 mg by mouth 2 (two) times daily as needed. dizziness    Historical Provider,  MD  omeprazole (PRILOSEC) 20 MG capsule Take 1 capsule (20 mg total) by mouth daily. 30 minutes before first meal of the day. For reflux. 03/29/12   Nira Retort, NP  ondansetron (ZOFRAN) 4 MG tablet Take 1 tablet (4 mg total) by mouth every 8 (eight) hours as needed for nausea. 04/12/12   Nira Retort, NP  polyethylene glycol powder (GLYCOLAX/MIRALAX) powder Take 17 g by mouth daily. For constipation 03/29/12   Nira Retort, NP   Physical Exam: Filed Vitals:   11/08/12 2308 11/08/12 2320 11/09/12 0227 11/09/12 0330   BP: 163/85  153/63   Pulse: 79  85   Temp: 98.3 F (36.8 C) 98.3 F (36.8 C)  97.6 F (36.4 C)  TempSrc: Oral   Oral  Resp: 24  20   Height: 5\' 6"  (1.676 m)   5\' 9"  (1.753 m)  Weight: 72.576 kg (160 lb)   68.2 kg (150 lb 5.7 oz)  SpO2: 99%  97%      General:  Chronically ill-appearing gentleman in moderate to severe since distress he has a deep grimace he is moaning, he is shaking, he does not respond he appears to be agitated, mostly aphasic, does not follow commands and does not answer questions  Eyes: Pupils are normal size, normally reactive  ENT: Dry poor dentition  Neck: No adenopathy  Cardiovascular: Regular rate and rhythm  Respiratory: Clear to auscultation bilaterally  Abdomen: A shin guards his abdomen is slightly tense there no masses palpated hypoactive bowel sounds he appears to withdraw to pain on palpation of his abdomen.  Skin: No rashes or lesions  Musculoskeletal: He appears to be having leg cramps and muscle spasm, his extremities are tremoring and shaking he cannot hold his hands or his legs still  Psychiatric:severe encephalopathy, difficulty concentrating and focusing  Neurologic: Global encephalopathy and confusion inability to follow commands moves all extremities spontaneously  Labs on Admission:  Basic Metabolic Panel:  Recent Labs Lab 11/08/12 2311 11/09/12 0303 11/09/12 0528  NA 120* 119* 122*  K 3.2* 3.6 3.4*  CL 83* 84* 87*  CO2 20 19 20   GLUCOSE 113* 128* 131*  BUN 6 5* 5*  CREATININE 0.80 0.73 0.74  CALCIUM 8.9 9.0 8.7  MG  --   --  1.7   Liver Function Tests:  Recent Labs Lab 11/08/12 2311  AST 23  ALT 17  ALKPHOS 60  BILITOT 0.6  PROT 6.7  ALBUMIN 4.0   CBC:  Recent Labs Lab 11/08/12 2311  WBC 10.2  NEUTROABS 8.5*  HGB 12.7*  HCT 34.3*  MCV 88.9  PLT 256   Cardiac Enzymes:  Recent Labs Lab 11/08/12 2311  TROPONINI <0.30    BNP (last 3 results)  Recent Labs  11/09/12 0528  PROBNP 1425.0*    CBG:  Recent Labs Lab 11/08/12 2324  GLUCAP 113*    Radiological Exams on Admission: Ct Abdomen Pelvis Wo Contrast  11/09/2012  *RADIOLOGY REPORT*  Clinical Data: Abdominal pain  CT ABDOMEN AND PELVIS WITHOUT CONTRAST  Technique:  Multidetector CT imaging of the abdomen and pelvis was performed following the standard protocol without intravenous contrast.  Comparison: 11/09/2012 radiograph, 04/17/2012 CT  Findings: Linear left lung base opacity, favor atelectasis or scarring.  Coronary artery and aortic valve calcification.  Organ abnormality/lesion detection is limited in the absence of intravenous contrast. Within this limitation, rounded hypodensity within the right hepatic lobe measuring 2.2 cm, similar to prior. No new hepatic lesions.  Unremarkable  spleen, pancreas, biliary system, adrenal glands.  Lobular renal contours.  No hydronephrosis.  The distal ureters are mildly dilated to the level of the thickened bladder, showing multiple bladder diverticula.  Advanced colonic diverticulosis.  No overt evidence for acute diverticulitis.  Normal appendix.  Small bowel loops are normal course and caliber.  Small hiatal hernia.  No free intraperitoneal air or fluid.  No lymphadenopathy.  There is scattered atherosclerotic calcification of the aorta and its branches. No aneurysmal dilatation.  Fat containing left greater than right inguinal hernias. Prostatomegaly.  Osteopenia and multilevel degenerative changes.  Leftward curvature of the lumbar spine.  IMPRESSION: Circumferential bladder wall thickening and multiple diverticula, likely secondary to chronic partial outlet obstruction in the setting of prostatomegaly.  Correlate with urinalysis if concerned for superimposed infection.  Advanced colonic diverticulosis without overt diverticulitis.   Original Report Authenticated By: Jearld Lesch, M.D.    Ct Head (brain) Wo Contrast  11/09/2012  *RADIOLOGY REPORT*  Clinical Data: Altered mental  status.  CT HEAD WITHOUT CONTRAST  Technique:  Contiguous axial images were obtained from the base of the skull through the vertex without contrast.  Comparison: MRI and CT 11/14/2011  Findings: Again seen is the aneurysm at the left middle cerebral artery bifurcation.  This currently measures 6 mm, stable since prior MRI.  No visible hemorrhage.  Study was repeated multiple times due to patient motion.  Given the motion artifact, it is difficult to exclude acute infarct.  No hydrocephalus.  IMPRESSION: Extensive patient motion despite repeating study.  Difficult to exclude acute infarction.  No visible hemorrhage.  Stable left middle cerebral artery aneurysm.   Original Report Authenticated By: Charlett Nose, M.D.    Dg Abd Acute W/chest  11/09/2012  *RADIOLOGY REPORT*  Clinical Data: Emesis, altered mental status  ACUTE ABDOMEN SERIES (ABDOMEN 2 VIEW & CHEST 1 VIEW)  Comparison: 04/28/2012  Findings: Linear opacity left lung base.  Heart size upper normal to mildly enlarged.  Bowel gas pattern nonspecific.  There are a few upper normal distended small bowel loops however gas is noted within normal caliber colon.  Curvature of the lumbar spine and diffuse osteopenia. No overt evidence for free intraperitoneal air on the upright view.  IMPRESSION: Nonspecific bowel gas pattern without overt obstruction.  Linear left lung base opacity, favor scarring.   Original Report Authenticated By: Jearld Lesch, M.D.     EKG: Independently reviewed.   Assessment/Plan Principal Problem:   Acute encephalopathy Active Problems:   Hyponatremia   Metabolic acidosis   Diverticulitis of colon   Anemia   Chronic abdominal pain   Unspecified hypothyroidism   Aphasia   Muscle cramps   Nausea with vomiting  Larry Cox has a long history of chronic nausea and vomiting with abdominal pain. He has chronic hyponatremia likely from SIADH. I suspect that his hyponatremia is driving his chronic nausea and vomiting as well  as associated with his abdominal pain. Currently his sodium is 120, he is neurologically impaired while he does not show signs of overt seizure activity he has significant focal findings and global encephalopathy that are probably being driven by an acute drop in his sodium- furthermore he was probably very thirsty at home since his son saw multiple water bottles in his house probably lead to his precipitous decline. He appears quite ill and he has an elevated lactic acid.  1. Severe hyponatremia with neurological impairment: Given the severity of his presentation 3% sodium chloride was given in 100  mL bolus and strict fluid restriction started--this improved his sodium to 122. Isotonic saline was given in ED and sodium dropped to 119. Q2 BMETS ordered, continue 3% boluses with improvement in symptoms w/o rising Na too quickly-goal under 6 in 24 hours.  2. Chronic Abdominal Pain: Unknown etiology prior complete workups we'll continue to follow. His elevated lactic acid is concerning for bowel ischemia -obtain a CT of his abdomen.  A couple other medical problems if his altered mental status persists will obtain an MRI to rule out acute stroke. At this time he does not appear to need any medication for seizure prophylaxis I suspect his change in mental status is due to cerebral edema from his hyponatremia. Once this is improved this should change. We'll have every 2 neurochecks.  Disposition Plan: Patient admitted to the intensive care unit under inpatient status anticipate 3-4 days hospitalization.  I briefly discussed goals of care with his family, they want full aggressive treatment at this time he is a full code. They will discuss DNR status and make a decsion.  Time spent: 70 minutes  [facility] Pager [phone]  If 7PM-7AM, please contact night-coverage [email or web address] Password [facility] 11/09/2012, 6:26 AM

## 2012-11-09 NOTE — Consult Note (Signed)
Referring Provider: No ref. provider found Primary Care Physician:  Carylon Perches, MD Primary Gastroenterologist:  DR. Jena Gauss Reason for Consultation:  ABDOMINAL PAIN , NAUSEA, VOMITING  HPI:  BROUGHT TO ED FOR MENTAL STATUS CHANGES AND VOMITING. HPI LIMITED LIMITED DUE TO PT COGNITIVE DYSFUNCTION Having projectile vomiting this am. Has confusion but getting better. PT NOT BACK T O BASELINE. NO BLOOD IN VOMIT.  NO BRBPR, RO MELENA. DID NOT C/O ABD PAIN TO NURSING.   Past Medical History  Diagnosis Date  . Hyponatremia   . Small bowel obstruction   . Hearing deficit   . Arthritis   . Chronic back pain   . 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  . Anemia   . Dizziness   . Diverticulitis of colon 11/14/2011  . GERD (gastroesophageal reflux disease)     Past Surgical History  Procedure Laterality Date  . None    . No past surgeries    . Colonoscopy  04/08/2012    RMR: Pancolonic diverticulosis    Prior to Admission medications   Medication Sig Start Date End Date Taking? Authorizing Provider  fish oil-omega-3 fatty acids 1000 MG capsule Take 1 g by mouth daily.     Yes Historical Provider, MD  levothyroxine (SYNTHROID, LEVOTHROID) 75 MCG tablet Take 75 mcg by mouth daily before breakfast.   Yes Historical Provider, MD  omeprazole (PRILOSEC) 20 MG capsule Take 1 capsule (20 mg total) by mouth daily. 30 minutes before first meal of the day. For reflux. 03/29/12  Yes Nira Retort, NP  ondansetron (ZOFRAN) 4 MG tablet Take 1 tablet (4 mg total) by mouth every 8 (eight) hours as needed for nausea. 04/12/12  Yes Nira Retort, NP  polyethylene glycol powder (GLYCOLAX/MIRALAX) powder Take 17 g by mouth daily. For constipation 03/29/12  Yes Nira Retort, NP  traMADol (ULTRAM) 50 MG tablet Take 50 mg by mouth 2 (two) times daily.   Yes Historical Provider, MD    Current Facility-Administered Medications  Medication Dose Route Frequency Provider Last Rate Last Dose  . albuterol  (PROVENTIL) (5 MG/ML) 0.5% nebulizer solution 2.5 mg  2.5 mg Nebulization Q2H PRN Edsel Petrin, DO      . haloperidol lactate (HALDOL) injection 1 mg  1 mg Intravenous Q6H PRN Leroy Sea, MD      . hydrALAZINE (APRESOLINE) injection 10 mg  10 mg Intravenous Q6H PRN Leroy Sea, MD      . HYDROmorphone (DILAUDID) injection 0.5 mg  0.5 mg Intravenous Q2H PRN Edsel Petrin, DO      . magnesium sulfate IVPB 1 g 100 mL  1 g Intravenous Once Leroy Sea, MD      . pantoprazole (PROTONIX) injection 40 mg  40 mg Intravenous Q12H Leroy Sea, MD   40 mg at 11/09/12 1107  . potassium chloride SA (K-DUR,KLOR-CON) CR tablet 40 mEq  40 mEq Oral Q4H PRN Leroy Sea, MD      . QUEtiapine (SEROQUEL) tablet 25 mg  25 mg Oral BID Leroy Sea, MD   25 mg at 11/09/12 1107  . sodium chloride 0.9 % injection 3 mL  3 mL Intravenous Q12H Edsel Petrin, DO        Allergies as of 11/08/2012 - Review Complete 11/08/2012  Allergen Reaction Noted  . Lorazepam Other (See Comments) 05/12/2011    Family History:  Colon Cancer  negative  Polyps  negative   History   Social History  . Marital Status: Married    Spouse Name: N/A    Number of Children: N/A  . Years of Education: N/A   Occupational History  . Not on file.   Social History Main Topics  . Smoking status: Former Smoker -- 0.30 packs/day    Types: Cigarettes  . Smokeless tobacco: Former Neurosurgeon    Quit date: 07/29/1958  . Alcohol Use: 0.6 oz/week    1 Cans of beer per week     Comment: 1 beer occasionally  . Drug Use: No  . Sexually Active: No   Other Topics Concern  . Not on file   Social History Narrative  . No narrative on file    Review of Systems: LIMITED DUE TO PT COGNITIVE DYSFUNCTION   Vitals: Blood pressure 156/82, pulse 85, temperature 98.5 F (36.9 C), temperature source Oral, resp. rate 20, height 5\' 9"  (1.753 m), weight 150 lb 5.7 oz (68.2 kg), SpO2  97.00%.  Physical Exam: General:   Alert,  pleasant and cooperative in NAD Head:  Normocephalic and atraumatic. Eyes:  Sclera clear, no icterus.   Conjunctiva pink. Neck:  Supple; . Lungs:  Clear throughout to auscultation.   No wheezes. No acute distress. Heart:  Regular rate and rhythm; no murmurs Abdomen:  Soft, nontender and nondistended. Normal bowel sounds, without guarding, and without rebound.   Msk:  Symmetrical with gross deformities. Extremities:  Without dema. Neurologic:  Alert;  NO  NEW FOCAL DEFICITS Cervical Nodes:  No significant cervical adenopathy. Psych:  Alert and cooperative. Normal mood and affect.  Lab Results:  Recent Labs  11/08/12 2311  WBC 10.2  HGB 12.7*  HCT 34.3*  PLT 256   BMET  Recent Labs  11/09/12 0726 11/09/12 0937  NA 122* 125*  K 3.4* 3.7  CL 88* 90*  CO2 22 22  GLUCOSE 119* 121*  BUN 5* 5*  CREATININE 0.72 0.74  CALCIUM 8.8 8.6   LFT  Recent Labs  11/08/12 2311  PROT 6.7  ALBUMIN 4.0  AST 23  ALT 17  ALKPHOS 60  BILITOT 0.6     Studies/Results: I PERSONALLY REVIEWED CT WITH DR. Toniann Fail ACUTE INTRA-ABDOMINAL PROCESS-BLADDER AND COLON TICS  Impression: PT ADMITTED WITH MENTAL STATUS CHANGES DUE TO LOW NA & ?NAUSEA/VOMTING/ABDOMINAL PAIN-ETIOLOGY UNCLEAR. DIFFERENTIAL INCLUDES VIRAL ILLNESS, H PYLORI GASTRITIS, LESS LIKELY PUD OR UNCONTROLELD GERD.  Plan: 1. SUPPORTIVE CARE 2. EGD PRIOR TO D/C AFTER NA CORRECTED 3. BID PPI 4. NPO EXCEPT MEDS UNTIL MS IMPROVES    LOS: 1 day   Tarik Teixeira  11/09/2012, 1:47 PM

## 2012-11-09 NOTE — Progress Notes (Signed)
Patient alert, disoriented, and irritable. Pt was pulling at foley catheter, swinging at nursing staff, and cursing. Order received to remove foley, and place condom catheter. Pt will not allow staff to place. Patient's daughter is at bedside. Bed alarm on.

## 2012-11-10 ENCOUNTER — Telehealth: Payer: Self-pay | Admitting: Gastroenterology

## 2012-11-10 DIAGNOSIS — R4182 Altered mental status, unspecified: Secondary | ICD-10-CM | POA: Diagnosis not present

## 2012-11-10 DIAGNOSIS — R4701 Aphasia: Secondary | ICD-10-CM | POA: Diagnosis not present

## 2012-11-10 DIAGNOSIS — E871 Hypo-osmolality and hyponatremia: Principal | ICD-10-CM

## 2012-11-10 DIAGNOSIS — G9340 Encephalopathy, unspecified: Secondary | ICD-10-CM

## 2012-11-10 DIAGNOSIS — G309 Alzheimer's disease, unspecified: Secondary | ICD-10-CM | POA: Diagnosis not present

## 2012-11-10 DIAGNOSIS — R109 Unspecified abdominal pain: Secondary | ICD-10-CM | POA: Diagnosis not present

## 2012-11-10 DIAGNOSIS — D649 Anemia, unspecified: Secondary | ICD-10-CM

## 2012-11-10 DIAGNOSIS — R1110 Vomiting, unspecified: Secondary | ICD-10-CM | POA: Diagnosis not present

## 2012-11-10 LAB — CBC
HCT: 33.6 % — ABNORMAL LOW (ref 39.0–52.0)
Hemoglobin: 12 g/dL — ABNORMAL LOW (ref 13.0–17.0)
MCH: 32.4 pg (ref 26.0–34.0)
MCHC: 35.7 g/dL (ref 30.0–36.0)
RDW: 13.9 % (ref 11.5–15.5)

## 2012-11-10 LAB — BASIC METABOLIC PANEL
BUN: 9 mg/dL (ref 6–23)
Calcium: 9.1 mg/dL (ref 8.4–10.5)
Creatinine, Ser: 0.82 mg/dL (ref 0.50–1.35)
GFR calc Af Amer: 88 mL/min — ABNORMAL LOW (ref >90)
GFR calc non Af Amer: 76 mL/min — ABNORMAL LOW (ref >90)
Glucose, Bld: 92 mg/dL (ref 70–99)
Potassium: 3.5 mEq/L (ref 3.5–5.1)

## 2012-11-10 NOTE — Progress Notes (Signed)
Subjective: Eating breakfast, no nausea or vomiting, reports vague right lower quadrant discomfort intermittently. Daughter at bedside.   Objective: Vital signs in last 24 hours: Temp:  [97.5 F (36.4 C)-98.7 F (37.1 C)] 97.5 F (36.4 C) (05/14 0533) Pulse Rate:  [63-67] 63 (05/14 0533) Resp:  [14-24] 18 (05/14 0533) BP: (109)/(51) 109/51 mmHg (05/14 0533) SpO2:  [97 %-100 %] 100 % (05/14 0533) Last BM Date: 11/09/12 General:   Alert and oriented, pleasant Head:  Normocephalic and atraumatic. Eyes:  No icterus, sclera clear. Conjuctiva pink.  Mouth:  Without lesions, mucosa pink and moist.  Heart:  S1, S2 present, no murmurs noted.  Lungs: Clear to auscultation bilaterally, without wheezing, rales, or rhonchi.  Abdomen:  Bowel sounds present, soft, non-tender, non-distended. No HSM or hernias noted. No rebound or guarding. No masses appreciated  Msk:  Symmetrical without gross deformities. Normal posture. Extremities:  Without clubbing or edema. Neurologic:  Alert and  oriented x4;  grossly normal neurologically. Skin:  Warm and dry, intact without significant lesions.  Psych:  Alert and cooperative. Normal mood and affect.  Intake/Output from previous day: 05/13 0701 - 05/14 0700 In: 100 [IV Piggyback:100] Out: 500 [Urine:500] Intake/Output this shift:    Lab Results:  Recent Labs  11/08/12 2311 11/10/12 0443  WBC 10.2 7.9  HGB 12.7* 12.0*  HCT 34.3* 33.6*  PLT 256 244   BMET  Recent Labs  11/09/12 0937 11/09/12 1342 11/10/12 0443  NA 125* 124* 130*  K 3.7 3.9 3.5  CL 90* 90* 97  CO2 22 22 24   GLUCOSE 121* 100* 92  BUN 5* 5* 9  CREATININE 0.74 0.76 0.82  CALCIUM 8.6 9.1 9.1   LFT  Recent Labs  11/08/12 2311  PROT 6.7  ALBUMIN 4.0  AST 23  ALT 17  ALKPHOS 60  BILITOT 0.6   PT/INR  Recent Labs  11/08/12 2311  LABPROT 13.4  INR 1.03     Studies/Results: Ct Abdomen Pelvis Wo Contrast  11/09/2012   *RADIOLOGY REPORT*  Clinical Data:  Abdominal pain  CT ABDOMEN AND PELVIS WITHOUT CONTRAST  Technique:  Multidetector CT imaging of the abdomen and pelvis was performed following the standard protocol without intravenous contrast.  Comparison: 11/09/2012 radiograph, 04/17/2012 CT  Findings: Linear left lung base opacity, favor atelectasis or scarring.  Coronary artery and aortic valve calcification.  Organ abnormality/lesion detection is limited in the absence of intravenous contrast. Within this limitation, rounded hypodensity within the right hepatic lobe measuring 2.2 cm, similar to prior. No new hepatic lesions.  Unremarkable spleen, pancreas, biliary system, adrenal glands.  Lobular renal contours.  No hydronephrosis.  The distal ureters are mildly dilated to the level of the thickened bladder, showing multiple bladder diverticula.  Advanced colonic diverticulosis.  No overt evidence for acute diverticulitis.  Normal appendix.  Small bowel loops are normal course and caliber.  Small hiatal hernia.  No free intraperitoneal air or fluid.  No lymphadenopathy.  There is scattered atherosclerotic calcification of the aorta and its branches. No aneurysmal dilatation.  Fat containing left greater than right inguinal hernias. Prostatomegaly.  Osteopenia and multilevel degenerative changes.  Leftward curvature of the lumbar spine.  IMPRESSION: Circumferential bladder wall thickening and multiple diverticula, likely secondary to chronic partial outlet obstruction in the setting of prostatomegaly.  Correlate with urinalysis if concerned for superimposed infection.  Advanced colonic diverticulosis without overt diverticulitis.   Original Report Authenticated By: Jearld Lesch, M.D.   Ct Head (brain) Wo Contrast  11/09/2012   *RADIOLOGY REPORT*  Clinical Data: Altered mental status.  CT HEAD WITHOUT CONTRAST  Technique:  Contiguous axial images were obtained from the base of the skull through the vertex without contrast.  Comparison: MRI and CT  11/14/2011  Findings: Again seen is the aneurysm at the left middle cerebral artery bifurcation.  This currently measures 6 mm, stable since prior MRI.  No visible hemorrhage.  Study was repeated multiple times due to patient motion.  Given the motion artifact, it is difficult to exclude acute infarct.  No hydrocephalus.  IMPRESSION: Extensive patient motion despite repeating study.  Difficult to exclude acute infarction.  No visible hemorrhage.  Stable left middle cerebral artery aneurysm.   Original Report Authenticated By: Charlett Nose, M.D.   Dg Abd Acute W/chest  11/09/2012   *RADIOLOGY REPORT*  Clinical Data: Emesis, altered mental status  ACUTE ABDOMEN SERIES (ABDOMEN 2 VIEW & CHEST 1 VIEW)  Comparison: 04/28/2012  Findings: Linear opacity left lung base.  Heart size upper normal to mildly enlarged.  Bowel gas pattern nonspecific.  There are a few upper normal distended small bowel loops however gas is noted within normal caliber colon.  Curvature of the lumbar spine and diffuse osteopenia. No overt evidence for free intraperitoneal air on the upright view.  IMPRESSION: Nonspecific bowel gas pattern without overt obstruction.  Linear left lung base opacity, favor scarring.   Original Report Authenticated By: Jearld Lesch, M.D.    Assessment: 79 year old male admitted with mental status changes, chronic abdominal pain, vomiting, hyponatremia. N/V resolved, mental status significantly improved and appears to be at baseline. Notes vague right lower quadrant discomfort, with benign physical exam. Colonoscopy up-to-date as of Oct 2013 with pancolonic diverticulosis. Question chronic abdominal pain. Due to persistent, chronic nausea, recommend EGD prior to discharge. I've discussed this with both the patient and the daughter, who are agreeable. Consider [place] of abd if EGD benign to assess for occult biliary process. Gallbladder remains in situ.   Plan: NPO after midnight for EGD with Dr. Jena Gauss on  5/15 PPI BID Consider [place] of abdomen as outpatient  Nira Retort, ANP-BC [facility]    LOS: 2 days    11/10/2012, 7:52 AM

## 2012-11-10 NOTE — Discharge Summary (Signed)
[facility]                                                                                   Larry Cox, is a 79 y.o. male  DOB 16-Nov-1921  MRN [id number].  Admission date:  11/08/2012  Discharge Date:  11/10/2012  Primary MD  Carylon Perches, MD  Admitting Physician  Edsel Petrin, DO  Admission Diagnosis  Hyponatremia [276.1] Acute encephalopathy [348.30]  Discharge Diagnosis     Principal Problem:   Acute encephalopathy Active Problems:   Hyponatremia   Metabolic acidosis   Diverticulitis of colon   Anemia   Chronic abdominal pain   Unspecified hypothyroidism   Aphasia   Muscle cramps   Nausea with vomiting      Past Medical History  Diagnosis Date  . Hyponatremia   . Small bowel obstruction   . Hearing deficit   . Arthritis   . Chronic back pain   . 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  . Anemia   . Dizziness   . Diverticulitis of colon 11/14/2011  . GERD (gastroesophageal reflux disease)     Past Surgical History  Procedure Laterality Date  . None    . No past surgeries    . Colonoscopy  04/08/2012    RMR: Pancolonic diverticulosis     Recommendations for primary care physician for things to follow:   Follow BMP and weight closely   Discharge Diagnoses:   Principal Problem:   Acute encephalopathy Active Problems:   Hyponatremia   Metabolic acidosis   Diverticulitis of colon   Anemia   Chronic abdominal pain   Unspecified hypothyroidism   Aphasia   Muscle cramps   Nausea with vomiting    Discharge Condition: Stable   Diet recommendation: See Discharge Instructions below   Consults renal,GI    History of present illness and  Hospital Course:     Kindly see H&P for history of present illness and admission details, please review complete Labs, Consult reports and Test reports for all details in brief Larry Cox, is a 79 y.o. male, patient with history of chronic nausea vomiting and abdominal  pain of unclear chronic she, recurrent hyponatremia, possible early dementia, hypothyroidism to the hospital for acute encephalopathy likely due to hyponatremia, his hyponatremia cause is unclear however it appears that it is due to a combination of intravascular depletion/dehydration with excessive free water intake causing further dilution of his sodium. He does have some chronic intermittent nausea vomiting and diarrhea, does not eat well but tries to drink plenty water, this probably depletes his sodium results and excessive intake of free water further dilutes his sodium levels, he was seen by nephrology, after gentle IV fluids with normal saline his sodium levels improved, he has been counseled on proper diet, to keep himself hydrated with Gatorade and to limit his free water intake to 800 cc a day. Please see written instructions below.  Of note patient has had recurrent nausea vomiting and abdominal pain of unclear etiology for which he was seen by GI, they offered him an EGD which he refused, and this time I think patient will  do better with close outpatient GI and nephrology followup. I had detailed discussion with patient and her daughter bedside, he refuses placement he refuses further testing and wants to go home, I have counseled family members that he should be living with his daughter who is agreeable to moving in with him at this point. I do think she might be developing early dementia.  We'll request his primary care physician to kindly monitor his BMP and weight closely in the outpatient setting.     Today   Subjective:   Larry Cox today has no headache,no chest abdominal pain,no new weakness tingling or numbness, feels much better wants to go home today.   Objective:   Blood pressure 109/51, pulse 63, temperature 97.5 F (36.4 C), temperature source Oral, resp. rate 18, height 5\' 9"  (1.753 m), weight 68.2 kg (150 lb 5.7 oz), SpO2 100.00%.   Intake/Output Summary (Last 24  hours) at 11/10/12 1034 Last data filed at 11/10/12 0830  Gross per 24 hour  Intake    220 ml  Output    400 ml  Net   -180 ml    Exam Awake Alert, Oriented x 2, No new F.N deficits, Normal affect [place].AT,PERRAL Supple Neck,No JVD, No cervical lymphadenopathy appriciated.  Symmetrical Chest wall movement, Good air movement bilaterally, CTAB RRR,No Gallops,Rubs or new Murmurs, No Parasternal Heave +ve B.Sounds, Abd Soft, Non tender, No organomegaly appriciated, No rebound -guarding or rigidity. No Cyanosis, Clubbing or edema, No new Rash or bruise  Data Review   Major procedures and Radiology Reports - PLEASE review detailed and final reports for all details in brief -       Ct Abdomen Pelvis Wo Contrast  11/09/2012   *RADIOLOGY REPORT*  Clinical Data: Abdominal pain  CT ABDOMEN AND PELVIS WITHOUT CONTRAST  Technique:  Multidetector CT imaging of the abdomen and pelvis was performed following the standard protocol without intravenous contrast.  Comparison: 11/09/2012 radiograph, 04/17/2012 CT  Findings: Linear left lung base opacity, favor atelectasis or scarring.  Coronary artery and aortic valve calcification.  Organ abnormality/lesion detection is limited in the absence of intravenous contrast. Within this limitation, rounded hypodensity within the right hepatic lobe measuring 2.2 cm, similar to prior. No new hepatic lesions.  Unremarkable spleen, pancreas, biliary system, adrenal glands.  Lobular renal contours.  No hydronephrosis.  The distal ureters are mildly dilated to the level of the thickened bladder, showing multiple bladder diverticula.  Advanced colonic diverticulosis.  No overt evidence for acute diverticulitis.  Normal appendix.  Small bowel loops are normal course and caliber.  Small hiatal hernia.  No free intraperitoneal air or fluid.  No lymphadenopathy.  There is scattered atherosclerotic calcification of the aorta and its branches. No aneurysmal dilatation.  Fat containing  left greater than right inguinal hernias. Prostatomegaly.  Osteopenia and multilevel degenerative changes.  Leftward curvature of the lumbar spine.  IMPRESSION: Circumferential bladder wall thickening and multiple diverticula, likely secondary to chronic partial outlet obstruction in the setting of prostatomegaly.  Correlate with urinalysis if concerned for superimposed infection.  Advanced colonic diverticulosis without overt diverticulitis.   Original Report Authenticated By: Jearld Lesch, M.D.   Ct Head (brain) Wo Contrast  11/09/2012   *RADIOLOGY REPORT*  Clinical Data: Altered mental status.  CT HEAD WITHOUT CONTRAST  Technique:  Contiguous axial images were obtained from the base of the skull through the vertex without contrast.  Comparison: MRI and CT 11/14/2011  Findings: Again seen is the aneurysm at the left  middle cerebral artery bifurcation.  This currently measures 6 mm, stable since prior MRI.  No visible hemorrhage.  Study was repeated multiple times due to patient motion.  Given the motion artifact, it is difficult to exclude acute infarct.  No hydrocephalus.  IMPRESSION: Extensive patient motion despite repeating study.  Difficult to exclude acute infarction.  No visible hemorrhage.  Stable left middle cerebral artery aneurysm.   Original Report Authenticated By: Charlett Nose, M.D.   Dg Abd Acute W/chest  11/09/2012   *RADIOLOGY REPORT*  Clinical Data: Emesis, altered mental status  ACUTE ABDOMEN SERIES (ABDOMEN 2 VIEW & CHEST 1 VIEW)  Comparison: 04/28/2012  Findings: Linear opacity left lung base.  Heart size upper normal to mildly enlarged.  Bowel gas pattern nonspecific.  There are a few upper normal distended small bowel loops however gas is noted within normal caliber colon.  Curvature of the lumbar spine and diffuse osteopenia. No overt evidence for free intraperitoneal air on the upright view.  IMPRESSION: Nonspecific bowel gas pattern without overt obstruction.  Linear left lung  base opacity, favor scarring.   Original Report Authenticated By: Jearld Lesch, M.D.    Micro Results      Recent Results (from the past 240 hour(s))  MRSA PCR SCREENING     Status: None   Collection Time    11/09/12  3:31 AM      Result Value Range Status   MRSA by PCR NEGATIVE  NEGATIVE Final   Comment:            The GeneXpert MRSA Assay (FDA     approved for NASAL specimens     only), is one component of a     comprehensive MRSA colonization     surveillance program. It is not     intended to diagnose MRSA     infection nor to guide or     monitor treatment for     MRSA infections.     CBC w Diff: Lab Results  Component Value Date   WBC 7.9 11/10/2012   HGB 12.0* 11/10/2012   HCT 33.6* 11/10/2012   PLT 244 11/10/2012   LYMPHOPCT 7* 11/08/2012   MONOPCT 9 11/08/2012   EOSPCT 0 11/08/2012   BASOPCT 0 11/08/2012    CMP: Lab Results  Component Value Date   NA 130* 11/10/2012   K 3.5 11/10/2012   CL 97 11/10/2012   CO2 24 11/10/2012   BUN 9 11/10/2012   CREATININE 0.82 11/10/2012   CREATININE 0.85 03/29/2012   PROT 6.7 11/08/2012   ALBUMIN 4.0 11/08/2012   BILITOT 0.6 11/08/2012   ALKPHOS 60 11/08/2012   AST 23 11/08/2012   ALT 17 11/08/2012  .   Discharge Instructions     Follow with Primary MD Carylon Perches, MD in 3 days   Get CBC, CMP, checked 3 days by Primary MD and again as instructed by your Primary MD.   Get Medicines reviewed and adjusted.  Please request your Prim.MD to go over all Hospital Tests and Procedure/Radiological results at the follow up, please get all Hospital records sent to your Prim MD by signing hospital release before you go home.  Activity: As tolerated with Full fall precautions use walker/cane & assistance as needed   Diet:  Heart healthy diet, try to restrict her water and ice intake to 800 cc a day, you can drink another 1 L of Gatorade per day, total fluid intake 1.8 L per day  For Heart failure  patients - Check your Weight same time  everyday, if you gain over 2 pounds, or you develop in leg swelling, experience more shortness of breath or chest pain, call your Primary MD immediately. Follow Cardiac Low Salt Diet and 1.8 lit/day fluid restriction.  Disposition Home    If you experience worsening of your admission symptoms, develop shortness of breath, life threatening emergency, suicidal or homicidal thoughts you must seek medical attention immediately by calling 911 or calling your MD immediately  if symptoms less severe.  You Must read complete instructions/literature along with all the possible adverse reactions/side effects for all the Medicines you take and that have been prescribed to you. Take any new Medicines after you have completely understood and accpet all the possible adverse reactions/side effects.   Do not drive and provide baby sitting services if your were admitted for syncope or siezures until you have seen by Primary MD or a Neurologist and advised to do so again.  Do not drive when taking Pain medications.    Do not take more than prescribed Pain, Sleep and Anxiety Medications  Special Instructions: If you have smoked or chewed Tobacco  in the last 2 yrs please stop smoking, stop any regular Alcohol  and or any Recreational drug use.  Wear Seat belts while driving.   Follow-up Information   Follow up with Carylon Perches, MD. Schedule an appointment as soon as possible for a visit in 3 days.   Contact information:   [address] [place] [address] [phone]       Follow up with [facility], MD. Schedule an appointment as soon as possible for a visit in 1 week.   Contact information:   [address] [place] [address] [phone]       Follow up with [name], MD. Schedule an appointment as soon as possible for a visit in 1 week.   Contact information:   [address] [place] [address] [phone]          Discharge Medications     Medication List    TAKE these medications       fish oil-omega-3 fatty acids 1000 MG capsule  Take 1 g by mouth daily.     levothyroxine 75 MCG tablet  Commonly known as:  SYNTHROID, LEVOTHROID  Take 75 mcg by mouth daily before breakfast.     omeprazole 20 MG capsule  Commonly known as:  PRILOSEC  Take 1 capsule (20 mg total) by mouth daily. 30 minutes before first meal of the day. For reflux.     ondansetron 4 MG tablet  Commonly known as:  ZOFRAN  Take 1 tablet (4 mg total) by mouth every 8 (eight) hours as needed for nausea.     polyethylene glycol powder powder  Commonly known as:  GLYCOLAX/MIRALAX  Take 17 g by mouth daily. For constipation     traMADol 50 MG tablet  Commonly known as:  ULTRAM  Take 50 mg by mouth 2 (two) times daily.      ASK your doctor about these medications       haloperidol 0.5 MG tablet  Commonly known as:  HALDOL  Take 1 tablet (0.5 mg total) by mouth every 6 (six) hours as needed. As directed           Total Time in preparing paper work, data evaluation and todays exam - 35 minutes  Leroy Sea M.D on  11/10/2012 at 10:34 AM  [facility] Office  [phone]

## 2012-11-10 NOTE — Clinical Social Work Note (Signed)
CSW signing off, patient does not desire placement.  CSW signing off as no further SW needs identified at this time, CSW will stand by to be reconsulted if needs arise.   Santa Genera, LCSW Clinical Social Worker [phone])

## 2012-11-10 NOTE — Consult Note (Signed)
[facility] Pj Zehner A. Gerilyn Pilgrim, MD     [email or web address]          Larry Cox is an 79 y.o. male.   ASSESSMENT/PLAN: Grigg: 1. Acute metabolic encephalopathy improved and back to baseline. Fluid restriction is again recommended. 2. Baseline cognitive impairment/dementia. Although the patient did not react well to Aricept, he can try other acetylcholinesterase inhibitor such as Exelon or Razadyne. Additionally, he also may try Namenda.  The patient is a 79 year old white male who has a baseline history of cognitive impairment and in fact has a diagnosis of vascular dementia. The patient was found to be confused and unresponsive. He has a history of chronic hyponatremia and has been treated for this in the past. He apparently drinks although free water which appears to be the main reason why he has the chronic hyponatremia. In evaluating the patient in the emergency room, his sodium was 119. The sodium has been corrected over close to 3 hours to 1:30. He has improved and essentially is back at baseline. The daughter reports that he does have baseline short-term memory impairment and some disorientation. He repeats things frequently. He actually is highly functional however living by himself and do his ADLs. His son lives next door. The patient apparently tried Aricept for his cognitive impairment but develop untoward side effects including irritability and restlessness. The review systems is limited given his baseline cognitive impairment with is essentially unremarkable. Does not complain of chest pain, shortness of breath, GU or GI symptoms.  GENERAL: This is a very pleasant man who is talkative and in no acute distress at this time.  HEENT: Supple. Atraumatic normocephalic.   ABDOMEN: soft  EXTREMITIES: No edema   BACK: Normal.  SKIN: Normal by inspection.    MENTAL STATUS: The patient is awake and alert. He has good fluency. He somewhat tangential at times. He is  oriented to person and place. He is not oriented to time.  CRANIAL NERVES: Pupils are equal, round and reactive to light and accommodation; extra ocular movements are full, there is no significant nystagmus; visual fields are full; upper and lower facial muscles are normal in strength and symmetric, there is no flattening of the nasolabial folds; tongue is midline; uvula is midline; shoulder elevation is normal.  MOTOR: Normal tone, bulk and strength; no pronator drift.  COORDINATION: Left finger to nose is normal, right finger to nose is normal, No rest tremor; no intention tremor; no postural tremor; no bradykinesia.  REFLEXES: Deep tendon reflexes are symmetrical and normal.   SENSATION: Normal to light touch.  GAIT: Slightly unsteady but essentially unremarkable.     Past Medical History  Diagnosis Date  . Hyponatremia   . Small bowel obstruction   . Hearing deficit   . Arthritis   . Chronic back pain   . 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  . Anemia   . Dizziness   . Diverticulitis of colon 11/14/2011  . GERD (gastroesophageal reflux disease)     Past Surgical History  Procedure Laterality Date  . None    . No past surgeries    . Colonoscopy  04/08/2012    RMR: Pancolonic diverticulosis    Family History  Problem Relation Age of Onset  . Colon cancer Neg Hx     Social History:  reports that he has quit smoking. His smoking use included Cigarettes. He smoked 0.30 packs per day. He quit smokeless tobacco use about 54 years ago. He  reports that he drinks about 0.6 ounces of alcohol per week. He reports that he does not use illicit drugs.  Allergies:  Allergies  Allergen Reactions  . Aricept (Donepezil Hcl)   . Lorazepam Other (See Comments)    Hallucinations, Violent Behavior    Medications: Prior to Admission medications   Medication Sig Start Date End Date Taking? Authorizing Provider  fish oil-omega-3 fatty acids 1000 MG capsule Take 1 g by  mouth daily.     Yes Historical Provider, MD  levothyroxine (SYNTHROID, LEVOTHROID) 75 MCG tablet Take 75 mcg by mouth daily before breakfast.   Yes Historical Provider, MD  omeprazole (PRILOSEC) 20 MG capsule Take 1 capsule (20 mg total) by mouth daily. 30 minutes before first meal of the day. For reflux. 03/29/12  Yes Nira Retort, NP  ondansetron (ZOFRAN) 4 MG tablet Take 1 tablet (4 mg total) by mouth every 8 (eight) hours as needed for nausea. 04/12/12  Yes Nira Retort, NP  polyethylene glycol powder (GLYCOLAX/MIRALAX) powder Take 17 g by mouth daily. For constipation 03/29/12  Yes Nira Retort, NP  traMADol (ULTRAM) 50 MG tablet Take 50 mg by mouth 2 (two) times daily.   Yes Historical Provider, MD    Scheduled Meds: . feeding supplement  237 mL Oral TID BM  . furosemide  20 mg Intravenous BID  . pantoprazole  40 mg Oral BID AC  . QUEtiapine  25 mg Oral BID  . sodium chloride  3 mL Intravenous Q12H   Continuous Infusions: . sodium chloride 75 mL/hr at 11/09/12 1813   PRN Meds:.albuterol, haloperidol lactate, hydrALAZINE, HYDROmorphone (DILAUDID) injection  Blood pressure 109/51, pulse 63, temperature 97.5 F (36.4 C), temperature source Oral, resp. rate 18, height 5\' 9"  (1.753 m), weight 68.2 kg (150 lb 5.7 oz), SpO2 100.00%.   Results for orders placed during the hospital encounter of 11/08/12 (from the past 48 hour(s))  PROTIME-INR     Status: None   Collection Time    11/08/12 11:11 PM      Result Value Range   Prothrombin Time 13.4  11.6 - 15.2 seconds   INR 1.03  0.00 - 1.49  APTT     Status: None   Collection Time    11/08/12 11:11 PM      Result Value Range   aPTT 28  24 - 37 seconds  CBC     Status: Abnormal   Collection Time    11/08/12 11:11 PM      Result Value Range   WBC 10.2  4.0 - 10.5 K/uL   RBC 3.86 (*) 4.22 - 5.81 MIL/uL   Hemoglobin 12.7 (*) 13.0 - 17.0 g/dL   HCT 16.1 (*) 09.6 - 04.5 %   MCV 88.9  78.0 - 100.0 fL   MCH 32.9  26.0 - 34.0 pg   MCHC  37.0 (*) 30.0 - 36.0 g/dL   RDW 40.9  81.1 - 91.4 %   Platelets 256  150 - 400 K/uL  DIFFERENTIAL     Status: Abnormal   Collection Time    11/08/12 11:11 PM      Result Value Range   Neutrophils Relative % 84 (*) 43 - 77 %   Neutro Abs 8.5 (*) 1.7 - 7.7 K/uL   Lymphocytes Relative 7 (*) 12 - 46 %   Lymphs Abs 0.8  0.7 - 4.0 K/uL   Monocytes Relative 9  3 - 12 %   Monocytes Absolute 0.9  0.1 -  1.0 K/uL   Eosinophils Relative 0  0 - 5 %   Eosinophils Absolute 0.0  0.0 - 0.7 K/uL   Basophils Relative 0  0 - 1 %   Basophils Absolute 0.0  0.0 - 0.1 K/uL  COMPREHENSIVE METABOLIC PANEL     Status: Abnormal   Collection Time    11/08/12 11:11 PM      Result Value Range   Sodium 120 (*) 135 - 145 mEq/L   Potassium 3.2 (*) 3.5 - 5.1 mEq/L   Chloride 83 (*) 96 - 112 mEq/L   CO2 20  19 - 32 mEq/L   Glucose, Bld 113 (*) 70 - 99 mg/dL   BUN 6  6 - 23 mg/dL   Creatinine, Ser 4.54  0.50 - 1.35 mg/dL   Calcium 8.9  8.4 - 09.8 mg/dL   Total Protein 6.7  6.0 - 8.3 g/dL   Albumin 4.0  3.5 - 5.2 g/dL   AST 23  0 - 37 U/L   ALT 17  0 - 53 U/L   Alkaline Phosphatase 60  39 - 117 U/L   Total Bilirubin 0.6  0.3 - 1.2 mg/dL   GFR calc non Af Amer 77 (*) >90 mL/min   GFR calc Af Amer 89 (*) >90 mL/min   Comment:            The eGFR has been calculated     using the CKD EPI equation.     This calculation has not been     validated in all clinical     situations.     eGFR's persistently     <90 mL/min signify     possible Chronic Kidney Disease.  TROPONIN I     Status: None   Collection Time    11/08/12 11:11 PM      Result Value Range   Troponin I <0.30  <0.30 ng/mL   Comment:            Due to the release kinetics of cTnI,     a negative result within the first hours     of the onset of symptoms does not rule out     myocardial infarction with certainty.     If myocardial infarction is still suspected,     repeat the test at appropriate intervals.  GLUCOSE, CAPILLARY     Status:  Abnormal   Collection Time    11/08/12 11:24 PM      Result Value Range   Glucose-Capillary 113 (*) 70 - 99 mg/dL  LACTIC ACID, PLASMA     Status: Abnormal   Collection Time    11/08/12 11:26 PM      Result Value Range   Lactic Acid, Venous 2.9 (*) 0.5 - 2.2 mmol/L  POCT I-STAT TROPONIN I     Status: None   Collection Time    11/08/12 11:33 PM      Result Value Range   Troponin i, poc 0.00  0.00 - 0.08 ng/mL   Comment 3            Comment: Due to the release kinetics of cTnI,     a negative result within the first hours     of the onset of symptoms does not rule out     myocardial infarction with certainty.     If myocardial infarction is still suspected,     repeat the test at appropriate intervals.  URINALYSIS, ROUTINE W REFLEX MICROSCOPIC  Status: Abnormal   Collection Time    11/08/12 11:50 PM      Result Value Range   Color, Urine YELLOW  YELLOW   APPearance CLEAR  CLEAR   Specific Gravity, Urine <1.005 (*) 1.005 - 1.030   pH 7.0  5.0 - 8.0   Glucose, UA NEGATIVE  NEGATIVE mg/dL   Hgb urine dipstick NEGATIVE  NEGATIVE   Bilirubin Urine NEGATIVE  NEGATIVE   Ketones, ur TRACE (*) NEGATIVE mg/dL   Protein, ur NEGATIVE  NEGATIVE mg/dL   Urobilinogen, UA 0.2  0.0 - 1.0 mg/dL   Nitrite NEGATIVE  NEGATIVE   Leukocytes, UA NEGATIVE  NEGATIVE   Comment: MICROSCOPIC NOT DONE ON URINES WITH NEGATIVE PROTEIN, BLOOD, LEUKOCYTES, NITRITE, OR GLUCOSE <1000 mg/dL.  SODIUM, URINE, RANDOM     Status: None   Collection Time    11/08/12 11:50 PM      Result Value Range   Sodium, Ur 59    OSMOLALITY     Status: Abnormal   Collection Time    11/09/12  3:03 AM      Result Value Range   Osmolality 249 (*) 275 - 300 mOsm/kg   Comment: Result repeated and verified.  BASIC METABOLIC PANEL     Status: Abnormal   Collection Time    11/09/12  3:03 AM      Result Value Range   Sodium 119 (*) 135 - 145 mEq/L   Comment: CRITICAL RESULT CALLED TO, READ BACK BY AND VERIFIED WITH:      WAGONER R AT 0337 ON [id number] BY FORSYTH K   Potassium 3.6  3.5 - 5.1 mEq/L   Chloride 84 (*) 96 - 112 mEq/L   CO2 19  19 - 32 mEq/L   Glucose, Bld 128 (*) 70 - 99 mg/dL   BUN 5 (*) 6 - 23 mg/dL   Creatinine, Ser 5.78  0.50 - 1.35 mg/dL   Calcium 9.0  8.4 - 46.9 mg/dL   GFR calc non Af Amer 80 (*) >90 mL/min   GFR calc Af Amer >90  >90 mL/min   Comment:            The eGFR has been calculated     using the CKD EPI equation.     This calculation has not been     validated in all clinical     situations.     eGFR's persistently     <90 mL/min signify     possible Chronic Kidney Disease.  MRSA PCR SCREENING     Status: None   Collection Time    11/09/12  3:31 AM      Result Value Range   MRSA by PCR NEGATIVE  NEGATIVE   Comment:            The GeneXpert MRSA Assay (FDA     approved for NASAL specimens     only), is one component of a     comprehensive MRSA colonization     surveillance program. It is not     intended to diagnose MRSA     infection nor to guide or     monitor treatment for     MRSA infections.  BASIC METABOLIC PANEL     Status: Abnormal   Collection Time    11/09/12  5:28 AM      Result Value Range   Sodium 122 (*) 135 - 145 mEq/L   Potassium 3.4 (*) 3.5 - 5.1 mEq/L  Chloride 87 (*) 96 - 112 mEq/L   CO2 20  19 - 32 mEq/L   Glucose, Bld 131 (*) 70 - 99 mg/dL   BUN 5 (*) 6 - 23 mg/dL   Creatinine, Ser 7.82  0.50 - 1.35 mg/dL   Calcium 8.7  8.4 - 95.6 mg/dL   GFR calc non Af Amer 79 (*) >90 mL/min   GFR calc Af Amer >90  >90 mL/min   Comment:            The eGFR has been calculated     using the CKD EPI equation.     This calculation has not been     validated in all clinical     situations.     eGFR's persistently     <90 mL/min signify     possible Chronic Kidney Disease.  MAGNESIUM     Status: None   Collection Time    11/09/12  5:28 AM      Result Value Range   Magnesium 1.7  1.5 - 2.5 mg/dL  PRO B NATRIURETIC PEPTIDE     Status: Abnormal    Collection Time    11/09/12  5:28 AM      Result Value Range   Pro B Natriuretic peptide (BNP) 1425.0 (*) 0 - 450 pg/mL  TSH     Status: None   Collection Time    11/09/12  5:28 AM      Result Value Range   TSH 2.492  0.350 - 4.500 uIU/mL  T4, FREE     Status: None   Collection Time    11/09/12  5:28 AM      Result Value Range   Free T4 1.27  0.80 - 1.80 ng/dL  BASIC METABOLIC PANEL     Status: Abnormal   Collection Time    11/09/12  7:26 AM      Result Value Range   Sodium 122 (*) 135 - 145 mEq/L   Potassium 3.4 (*) 3.5 - 5.1 mEq/L   Chloride 88 (*) 96 - 112 mEq/L   CO2 22  19 - 32 mEq/L   Glucose, Bld 119 (*) 70 - 99 mg/dL   BUN 5 (*) 6 - 23 mg/dL   Creatinine, Ser 2.13  0.50 - 1.35 mg/dL   Calcium 8.8  8.4 - 08.6 mg/dL   GFR calc non Af Amer 80 (*) >90 mL/min   GFR calc Af Amer >90  >90 mL/min   Comment:            The eGFR has been calculated     using the CKD EPI equation.     This calculation has not been     validated in all clinical     situations.     eGFR's persistently     <90 mL/min signify     possible Chronic Kidney Disease.  TROPONIN I     Status: None   Collection Time    11/09/12  7:26 AM      Result Value Range   Troponin I <0.30  <0.30 ng/mL   Comment:            Due to the release kinetics of cTnI,     a negative result within the first hours     of the onset of symptoms does not rule out     myocardial infarction with certainty.     If myocardial infarction is still suspected,     repeat the  test at appropriate intervals.  OSMOLALITY     Status: Abnormal   Collection Time    11/09/12  7:26 AM      Result Value Range   Osmolality 256 (*) 275 - 300 mOsm/kg  BASIC METABOLIC PANEL     Status: Abnormal   Collection Time    11/09/12  9:37 AM      Result Value Range   Sodium 125 (*) 135 - 145 mEq/L   Potassium 3.7  3.5 - 5.1 mEq/L   Chloride 90 (*) 96 - 112 mEq/L   CO2 22  19 - 32 mEq/L   Glucose, Bld 121 (*) 70 - 99 mg/dL   BUN 5 (*) 6 -  23 mg/dL   Creatinine, Ser 1.61  0.50 - 1.35 mg/dL   Calcium 8.6  8.4 - 09.6 mg/dL   GFR calc non Af Amer 79 (*) >90 mL/min   GFR calc Af Amer >90  >90 mL/min   Comment:            The eGFR has been calculated     using the CKD EPI equation.     This calculation has not been     validated in all clinical     situations.     eGFR's persistently     <90 mL/min signify     possible Chronic Kidney Disease.  MAGNESIUM     Status: None   Collection Time    11/09/12  9:37 AM      Result Value Range   Magnesium 1.8  1.5 - 2.5 mg/dL  OSMOLALITY, URINE     Status: Abnormal   Collection Time    11/09/12 11:15 AM      Result Value Range   Osmolality, Ur 249 (*) 390 - 1090 mOsm/kg   Comment: RESULT REPEATED AND VERIFIED     CRITICAL RESULT CALLED TO, READ BACK BY AND VERIFIED WITH:     A. STONE AT 1806 ON 11/09/12 BY S. VANHOORNE  SODIUM, URINE, RANDOM     Status: None   Collection Time    11/09/12 11:15 AM      Result Value Range   Sodium, Ur 12    BASIC METABOLIC PANEL     Status: Abnormal   Collection Time    11/09/12  1:42 PM      Result Value Range   Sodium 124 (*) 135 - 145 mEq/L   Potassium 3.9  3.5 - 5.1 mEq/L   Chloride 90 (*) 96 - 112 mEq/L   CO2 22  19 - 32 mEq/L   Glucose, Bld 100 (*) 70 - 99 mg/dL   BUN 5 (*) 6 - 23 mg/dL   Creatinine, Ser 0.45  0.50 - 1.35 mg/dL   Calcium 9.1  8.4 - 40.9 mg/dL   GFR calc non Af Amer 79 (*) >90 mL/min   GFR calc Af Amer >90  >90 mL/min   Comment:            The eGFR has been calculated     using the CKD EPI equation.     This calculation has not been     validated in all clinical     situations.     eGFR's persistently     <90 mL/min signify     possible Chronic Kidney Disease.  CBC     Status: Abnormal   Collection Time    11/10/12  4:43 AM      Result Value Range  WBC 7.9  4.0 - 10.5 K/uL   RBC 3.70 (*) 4.22 - 5.81 MIL/uL   Hemoglobin 12.0 (*) 13.0 - 17.0 g/dL   HCT 16.1 (*) 09.6 - 04.5 %   MCV 90.8  78.0 - 100.0 fL    MCH 32.4  26.0 - 34.0 pg   MCHC 35.7  30.0 - 36.0 g/dL   RDW 40.9  81.1 - 91.4 %   Platelets 244  150 - 400 K/uL  BASIC METABOLIC PANEL     Status: Abnormal   Collection Time    11/10/12  4:43 AM      Result Value Range   Sodium 130 (*) 135 - 145 mEq/L   Potassium 3.5  3.5 - 5.1 mEq/L   Chloride 97  96 - 112 mEq/L   CO2 24  19 - 32 mEq/L   Glucose, Bld 92  70 - 99 mg/dL   BUN 9  6 - 23 mg/dL   Creatinine, Ser 7.82  0.50 - 1.35 mg/dL   Calcium 9.1  8.4 - 95.6 mg/dL   GFR calc non Af Amer 76 (*) >90 mL/min   GFR calc Af Amer 88 (*) >90 mL/min   Comment:            The eGFR has been calculated     using the CKD EPI equation.     This calculation has not been     validated in all clinical     situations.     eGFR's persistently     <90 mL/min signify     possible Chronic Kidney Disease.    Ct Abdomen Pelvis Wo Contrast  11/09/2012   *RADIOLOGY REPORT*  Clinical Data: Abdominal pain  CT ABDOMEN AND PELVIS WITHOUT CONTRAST  Technique:  Multidetector CT imaging of the abdomen and pelvis was performed following the standard protocol without intravenous contrast.  Comparison: 11/09/2012 radiograph, 04/17/2012 CT  Findings: Linear left lung base opacity, favor atelectasis or scarring.  Coronary artery and aortic valve calcification.  Organ abnormality/lesion detection is limited in the absence of intravenous contrast. Within this limitation, rounded hypodensity within the right hepatic lobe measuring 2.2 cm, similar to prior. No new hepatic lesions.  Unremarkable spleen, pancreas, biliary system, adrenal glands.  Lobular renal contours.  No hydronephrosis.  The distal ureters are mildly dilated to the level of the thickened bladder, showing multiple bladder diverticula.  Advanced colonic diverticulosis.  No overt evidence for acute diverticulitis.  Normal appendix.  Small bowel loops are normal course and caliber.  Small hiatal hernia.  No free intraperitoneal air or fluid.  No lymphadenopathy.   There is scattered atherosclerotic calcification of the aorta and its branches. No aneurysmal dilatation.  Fat containing left greater than right inguinal hernias. Prostatomegaly.  Osteopenia and multilevel degenerative changes.  Leftward curvature of the lumbar spine.  IMPRESSION: Circumferential bladder wall thickening and multiple diverticula, likely secondary to chronic partial outlet obstruction in the setting of prostatomegaly.  Correlate with urinalysis if concerned for superimposed infection.  Advanced colonic diverticulosis without overt diverticulitis.   Original Report Authenticated By: Jearld Lesch, M.D.   Ct Head (brain) Wo Contrast  11/09/2012   *RADIOLOGY REPORT*  Clinical Data: Altered mental status.  CT HEAD WITHOUT CONTRAST  Technique:  Contiguous axial images were obtained from the base of the skull through the vertex without contrast.  Comparison: MRI and CT 11/14/2011  Findings: Again seen is the aneurysm at the left middle cerebral artery bifurcation.  This currently measures  6 mm, stable since prior MRI.  No visible hemorrhage.  Study was repeated multiple times due to patient motion.  Given the motion artifact, it is difficult to exclude acute infarct.  No hydrocephalus.  IMPRESSION: Extensive patient motion despite repeating study.  Difficult to exclude acute infarction.  No visible hemorrhage.  Stable left middle cerebral artery aneurysm.   Original Report Authenticated By: Charlett Nose, M.D.   Dg Abd Acute W/chest  11/09/2012   *RADIOLOGY REPORT*  Clinical Data: Emesis, altered mental status  ACUTE ABDOMEN SERIES (ABDOMEN 2 VIEW & CHEST 1 VIEW)  Comparison: 04/28/2012  Findings: Linear opacity left lung base.  Heart size upper normal to mildly enlarged.  Bowel gas pattern nonspecific.  There are a few upper normal distended small bowel loops however gas is noted within normal caliber colon.  Curvature of the lumbar spine and diffuse osteopenia. No overt evidence for free  intraperitoneal air on the upright view.  IMPRESSION: Nonspecific bowel gas pattern without overt obstruction.  Linear left lung base opacity, favor scarring.   Original Report Authenticated By: Jearld Lesch, M.D.        Jas Betten A. Gerilyn Pilgrim, M.D.  Diplomate, Biomedical engineer of Psychiatry and Neurology ( Neurology). 11/10/2012, 9:19 AM

## 2012-11-10 NOTE — Progress Notes (Signed)
Discharge instructions given, verbalized understanding by daughter, out in stable condition via w/c with staff.

## 2012-11-10 NOTE — Telephone Encounter (Signed)
Patient recently in hospital. Please make appt to see me to discuss ?EGD

## 2012-11-10 NOTE — Progress Notes (Signed)
PT Cancellation Note  Patient Details Name: Larry Cox MRN: [id number] DOB: 08-Jun-1922   Cancelled Treatment:     Pt is walking I able to converse normally; demonstrates to therapist how he can dance.  Pt and family state that pt does not need therapy.  Will sign off on this patient.   Charlie Seda,CINDY 11/10/2012, 9:48 AM

## 2012-11-10 NOTE — Progress Notes (Signed)
EEG attempted this am around 7:30 am and patient refused. Nurse called into room and spoke to patient and tried to talk patient into having EEG, patient still refused EEG to nurse, nurse stated we could not make him do it. EEG was not performed due to this. Nurse stated that she was cancelling this order

## 2012-11-10 NOTE — Progress Notes (Signed)
Larry Cox  MRN: [id number]  DOB/AGE: Jun 13, 1922 79 y.o.  Primary Care Physician:FAGAN,ROY, MD  Admit date: 11/08/2012  Chief Complaint:  Chief Complaint  Patient presents with  . Altered Mental Status  . Emesis    S-Pt presented on  11/08/2012 with  Chief Complaint  Patient presents with  . Altered Mental Status  . Emesis  .    Pt today feels better.    Physical Exam: Vital signs in last 24 hours: Temp:  [97.5 F (36.4 C)] 97.5 F (36.4 C) (05/14 0533) Pulse Rate:  [63] 63 (05/14 0533) Resp:  [18] 18 (05/14 0533) BP: (109)/(51) 109/51 mmHg (05/14 0533) SpO2:  [100 %] 100 % (05/14 0533) Weight change:  Last BM Date: 11/09/12  Intake/Output from previous day: 05/13 0701 - 05/14 0700 In: 100 [IV Piggyback:100] Out: 500 [Urine:500] Total I/O In: 120 [P.O.:120] Out: -    Physical Exam: General- pt is awake, follows comands Resp- No acute REsp distress, CTA B/L NO Rhonchi CVS- S1S2 regular in rate and rhythm GIT- BS+, soft, NT, ND EXT- NO LE Edema, Cyanosis   Lab Results: CBC  Recent Labs  11/08/12 2311 11/10/12 0443  WBC 10.2 7.9  HGB 12.7* 12.0*  HCT 34.3* 33.6*  PLT 256 244    BMET  Recent Labs  11/09/12 1342 11/10/12 0443  NA 124* 130*  K 3.9 3.5  CL 90* 97  CO2 22 24  GLUCOSE 100* 92  BUN 5* 9  CREATININE 0.76 0.82  CALCIUM 9.1 9.1   Sodium Trend 2014  119==>122===>125==>124==>130  MICRO Recent Results (from the past 240 hour(s))  MRSA PCR SCREENING     Status: None   Collection Time    11/09/12  3:31 AM      Result Value Range Status   MRSA by PCR NEGATIVE  NEGATIVE Final   Comment:            The GeneXpert MRSA Assay (FDA     approved for NASAL specimens     only), is one component of a     comprehensive MRSA colonization     surveillance program. It is not     intended to diagnose MRSA     infection nor to guide or     monitor treatment for     MRSA infections.      Lab Results  Component Value Date   CALCIUM 9.1 11/10/2012   PHOS 2.7 05/11/2011      Impression: 1)Hyponatremia    Etiology Multifactorial    Pt on IVF + Free wtaer restriction    Sodium improving-much better   2)HTN bp stable  Medication- On Diuretics  3)Anemia HGb at goal (9--11)   4)CNS-Pt admitted with AMS Now better  5)Hyperlipidemia .  Ldl at goal PMD following  6)GI- admitted with Nausea and pain Hx of SBO Primary team following  7)Acid base Co2 at goal     Plan:  Agree with current tx and plan       Cox,Larry S 11/10/2012, 4:27 PM

## 2012-11-17 ENCOUNTER — Encounter: Payer: Self-pay | Admitting: Gastroenterology

## 2012-11-17 NOTE — Telephone Encounter (Signed)
Pt is aware of OV on 6/3 at 11 with AS and appt card was mailed

## 2012-11-30 ENCOUNTER — Ambulatory Visit: Payer: Medicare Other | Admitting: Gastroenterology

## 2012-12-11 ENCOUNTER — Observation Stay (HOSPITAL_COMMUNITY)
Admission: EM | Admit: 2012-12-11 | Discharge: 2012-12-12 | Disposition: A | Discharge status: Home / Self Care | Payer: Medicare Other | Attending: Family Medicine | Admitting: Family Medicine

## 2012-12-11 ENCOUNTER — Emergency Department (HOSPITAL_COMMUNITY): Payer: Medicare Other

## 2012-12-11 ENCOUNTER — Encounter (HOSPITAL_COMMUNITY): Payer: Self-pay

## 2012-12-11 DIAGNOSIS — J984 Other disorders of lung: Secondary | ICD-10-CM | POA: Diagnosis not present

## 2012-12-11 DIAGNOSIS — R0789 Other chest pain: Secondary | ICD-10-CM | POA: Diagnosis not present

## 2012-12-11 DIAGNOSIS — R079 Chest pain, unspecified: Secondary | ICD-10-CM

## 2012-12-11 DIAGNOSIS — F0390 Unspecified dementia without behavioral disturbance: Secondary | ICD-10-CM | POA: Insufficient documentation

## 2012-12-11 DIAGNOSIS — K4020 Bilateral inguinal hernia, without obstruction or gangrene, not specified as recurrent: Secondary | ICD-10-CM | POA: Insufficient documentation

## 2012-12-11 DIAGNOSIS — R110 Nausea: Secondary | ICD-10-CM | POA: Diagnosis not present

## 2012-12-11 DIAGNOSIS — R1032 Left lower quadrant pain: Secondary | ICD-10-CM | POA: Diagnosis not present

## 2012-12-11 DIAGNOSIS — R109 Unspecified abdominal pain: Secondary | ICD-10-CM | POA: Insufficient documentation

## 2012-12-11 DIAGNOSIS — G8929 Other chronic pain: Secondary | ICD-10-CM | POA: Insufficient documentation

## 2012-12-11 HISTORY — DX: Unspecified abdominal hernia without obstruction or gangrene: K46.9

## 2012-12-11 LAB — BASIC METABOLIC PANEL
BUN: 6 mg/dL (ref 6–23)
Chloride: 99 mEq/L (ref 96–112)
GFR calc Af Amer: 88 mL/min — ABNORMAL LOW (ref >90)
GFR calc non Af Amer: 76 mL/min — ABNORMAL LOW (ref >90)
Potassium: 3.7 mEq/L (ref 3.5–5.1)
Sodium: 136 mEq/L (ref 135–145)

## 2012-12-11 LAB — CBC WITH DIFFERENTIAL/PLATELET
Basophils Absolute: 0 10*3/uL (ref 0.0–0.1)
Basophils Relative: 0 % (ref 0–1)
Eosinophils Absolute: 0.1 10*3/uL (ref 0.0–0.7)
MCH: 32.6 pg (ref 26.0–34.0)
MCHC: 34.8 g/dL (ref 30.0–36.0)
Monocytes Relative: 10 % (ref 3–12)
Neutro Abs: 3.4 10*3/uL (ref 1.7–7.7)
Neutrophils Relative %: 70 % (ref 43–77)
Platelets: 229 10*3/uL (ref 150–400)
RDW: 13.2 % (ref 11.5–15.5)

## 2012-12-11 LAB — TROPONIN I
Troponin I: <0.3 ng/mL (ref <0.30)
Troponin I: <0.3 ng/mL (ref <0.30)

## 2012-12-11 MED ORDER — ACETAMINOPHEN 650 MG RE SUPP: 650.0000 mg | Freq: Four times a day (QID) | RECTAL | Status: DC | PRN

## 2012-12-11 MED ORDER — SODIUM CHLORIDE 0.9 % IV SOLN: 250.0000 mL | INTRAVENOUS | Status: DC | PRN

## 2012-12-11 MED ORDER — LEVOTHYROXINE SODIUM 75 MCG PO TABS
75.0000 ug | ORAL_TABLET | Freq: Every day | ORAL | Status: DC
  Administered 2012-12-12: 75 ug via ORAL
  Filled 2012-12-11: qty 1

## 2012-12-11 MED ORDER — ACETAMINOPHEN 325 MG PO TABS: 650.0000 mg | ORAL_TABLET | Freq: Four times a day (QID) | ORAL | Status: DC | PRN

## 2012-12-11 MED ORDER — SODIUM CHLORIDE 0.9 % IJ SOLN: 3.0000 mL | INTRAMUSCULAR | Status: DC | PRN

## 2012-12-11 MED ORDER — TRAMADOL HCL 50 MG PO TABS
50.0000 mg | ORAL_TABLET | Freq: Two times a day (BID) | ORAL | Status: DC
  Administered 2012-12-11 – 2012-12-12 (×2): 50 mg via ORAL
  Filled 2012-12-11 (×3): qty 1

## 2012-12-11 MED ORDER — ASPIRIN 325 MG PO TABS
325.0000 mg | ORAL_TABLET | Freq: Once | ORAL | Status: AC
  Administered 2012-12-11: 325 mg via ORAL
  Filled 2012-12-11: qty 1

## 2012-12-11 MED ORDER — ALUM & MAG HYDROXIDE-SIMETH 200-200-20 MG/5ML PO SUSP: 30.0000 mL | Freq: Four times a day (QID) | ORAL | Status: DC | PRN

## 2012-12-11 MED ORDER — SODIUM CHLORIDE 0.9 % IJ SOLN: 3.0000 mL | Freq: Two times a day (BID) | INTRAMUSCULAR | Status: DC

## 2012-12-11 MED ORDER — POLYETHYLENE GLYCOL 3350 17 GM/SCOOP PO POWD: 17.0000 g | Freq: Every day | ORAL | Status: DC

## 2012-12-11 MED ORDER — ASPIRIN EC 81 MG PO TBEC
81.0000 mg | DELAYED_RELEASE_TABLET | Freq: Every day | ORAL | Status: DC
  Administered 2012-12-12: 81 mg via ORAL
  Filled 2012-12-11: qty 1

## 2012-12-11 MED ORDER — PANTOPRAZOLE SODIUM 40 MG PO TBEC
40.0000 mg | DELAYED_RELEASE_TABLET | Freq: Every day | ORAL | Status: DC
  Administered 2012-12-12: 40 mg via ORAL
  Filled 2012-12-11: qty 1

## 2012-12-11 MED ORDER — SODIUM CHLORIDE 0.9 % IJ SOLN
3.0000 mL | Freq: Two times a day (BID) | INTRAMUSCULAR | Status: DC
  Administered 2012-12-11: 3 mL via INTRAVENOUS

## 2012-12-11 MED ORDER — ONDANSETRON HCL 4 MG PO TABS
4.0000 mg | ORAL_TABLET | Freq: Three times a day (TID) | ORAL | Status: DC | PRN
  Administered 2012-12-11 – 2012-12-12 (×2): 4 mg via ORAL
  Filled 2012-12-11 (×2): qty 1

## 2012-12-11 MED ORDER — POLYETHYLENE GLYCOL 3350 17 G PO PACK
17.0000 g | PACK | Freq: Every day | ORAL | Status: DC
  Administered 2012-12-11 – 2012-12-12 (×2): 17 g via ORAL
  Filled 2012-12-11 (×2): qty 1

## 2012-12-11 NOTE — ED Provider Notes (Signed)
History  This chart was scribed for Larry Gaskins, MD by Greggory Stallion, ED Scribe. This patient was seen in room APA08/APA08 and the patient's care was started at 11:51 AM.  CSN: [id number]  Arrival date & time 12/11/12  1130    Chief Complaint  Patient presents with  . Chest Pain    Patient is a 79 y.o. male presenting with chest pain. The history is provided by the patient and a relative. No language interpreter was used.  Chest Pain Pain location:  L chest Pain radiates to:  L arm Pain radiates to the back: no   Pain severity:  Moderate Onset quality:  Gradual Duration:  2 days Timing:  Constant Relieved by:  Nothing Worsened by:  Nothing tried Ineffective treatments:  None tried Associated symptoms: abdominal pain and cough   Associated symptoms: no back pain, no fever and no shortness of breath     HPI Comments: CADARIUS NEVARES is a 79 y.o. male who presents to the Emergency Department complaining of left sided, sharp chest pain that radiates to his left arm that started two days ago. He states he is also having right sided abdominal pain. Pt's daughter states he has also been having sharp pains in his leg. She states he has had nausea and lightheadedness. Pt states he has been having a productive cough. Pt's daughter denies the pt having LOC. Pt denies fever, neck pain, sore throat, visual disturbance, SOB, emesis, diarrhea, urinary symptoms, back pain, HA, weakness, numbness and rash as associated symptoms.    Past Medical History  Diagnosis Date  . Hyponatremia   . Small bowel obstruction   . Hearing deficit   . Arthritis   . Chronic back pain   . 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  . Anemia   . Dizziness   . Diverticulitis of colon 11/14/2011  . GERD (gastroesophageal reflux disease)   . Hernia     Past Surgical History  Procedure Laterality Date  . None    . No past surgeries    . Colonoscopy  04/08/2012    RMR: Pancolonic  diverticulosis    Family History  Problem Relation Age of Onset  . Colon cancer Neg Hx     History  Substance Use Topics  . Smoking status: Former Smoker -- 0.30 packs/day    Types: Cigarettes  . Smokeless tobacco: Former Neurosurgeon    Quit date: 07/29/1958  . Alcohol Use: 0.6 oz/week    1 Cans of beer per week     Comment: 1 beer occasionally      Review of Systems  Constitutional: Negative for fever.  Respiratory: Positive for cough. Negative for shortness of breath.   Cardiovascular: Positive for chest pain.  Gastrointestinal: Positive for abdominal pain.  Musculoskeletal: Negative for back pain.  All other systems reviewed and are negative.    Allergies  Aricept and Lorazepam  Home Medications   Current Outpatient Rx  Name  Route  Sig  Dispense  Refill  . levothyroxine (SYNTHROID, LEVOTHROID) 75 MCG tablet   Oral   Take 75 mcg by mouth daily before breakfast.         . omeprazole (PRILOSEC) 20 MG capsule   Oral   Take 1 capsule (20 mg total) by mouth daily. 30 minutes before first meal of the day. For reflux.   30 capsule   3   . ondansetron (ZOFRAN) 4 MG tablet   Oral   Take  1 tablet (4 mg total) by mouth every 8 (eight) hours as needed for nausea.   30 tablet   1   . polyethylene glycol powder (GLYCOLAX/MIRALAX) powder   Oral   Take 17 g by mouth daily. For constipation   255 g   3   . traMADol (ULTRAM) 50 MG tablet   Oral   Take 50 mg by mouth 2 (two) times daily.           BP 123/69  Pulse 98  Temp(Src) 98.3 F (36.8 C) (Oral)  Resp 18  SpO2 97%  Physical Exam Pt is hard of hearing which makes exam difficult CONSTITUTIONAL: Well developed/well nourished HEAD: Normocephalic/atraumatic EYES: EOMI/PERRL ENMT: Mucous membranes moist NECK: supple no meningeal signs SPINE:entire spine nontender CV: S1/S2 noted, no murmurs/rubs/gallops noted LUNGS: Lungs are clear to auscultation bilaterally, no apparent distress ABDOMEN: soft,  nontender, no rebound or guarding, no large hernias noted GU:no cva tenderness NEURO: Pt is awake/alert, moves all extremitiesx4 EXTREMITIES: pulses normal, full ROM SKIN: warm, color normal PSYCH: no abnormalities of mood noted  ED Course  Procedures   DIAGNOSTIC STUDIES: Oxygen Saturation is 97% on RA, normal by my interpretation.    COORDINATION OF CARE: 12:14 PM-Discussed treatment plan with pt at bedside and pt agreed to plan.   Labs Reviewed  CBC WITH DIFFERENTIAL - Abnormal; Notable for the following:    RBC 3.86 (*)    Hemoglobin 12.6 (*)    HCT 36.2 (*)    All other components within normal limits  BASIC METABOLIC PANEL - Abnormal; Notable for the following:    GFR calc non Af Amer 76 (*)    GFR calc Af Amer 88 (*)    All other components within normal limits  TROPONIN I   Dg Chest Portable 1 View  12/11/2012   *RADIOLOGY REPORT*  Clinical Data: Chest pain.  Anemia.  PORTABLE CHEST - 1 VIEW  Comparison: 11/09/2012  Findings: Left lower lobe scarring again seen.  Lungs otherwise clear.  No evidence of pleural effusion.  Heart size within normal limits.  IMPRESSION: Stable left lower lobe scarring.  No acute findings.   Original Report Authenticated By: Myles Rosenthal, M.D.    Pt is CP free at this time Given age and history, will admit for chest pain evaluation Suspicion for PE at this time is low D/w dr Glenna Durand, will admit to tele for dr fagan   MDM  Nursing notes including past medical history and social history reviewed and considered in documentation xrays reviewed and considered Labs/vital reviewed and considered Previous records reviewed and considered - recent discharge summary reviewed       Date: 12/11/2012  Rate: 89  Rhythm: normal sinus rhythm  QRS Axis: normal  Intervals: normal  ST/T Wave abnormalities: nonspecific ST changes  Conduction Disutrbances:none  Narrative Interpretation:   Old EKG Reviewed: changes noted     I personally  performed the services described in this documentation, which was scribed in my presence. The recorded information has been reviewed and is accurate.       Larry Gaskins, MD 12/11/12 1322

## 2012-12-11 NOTE — ED Notes (Signed)
Pt had to use restroom upon arrival to ED.  PT in bathroom.

## 2012-12-11 NOTE — ED Notes (Signed)
Pt c/o left sided chest pain radiating to left arm for the past 2 days.  Reports nausea and lightheadedness.  Pt alert, oriented, and pleasant.   HOH.  Also reports for the past few days has had sudden sharp pain in left arm and leg that causes him to jerk.

## 2012-12-11 NOTE — H&P (Addendum)
History and Physical  Larry Cox ZOX:[id number] DOB: 26-Mar-1922 DOA: 12/11/2012  Referring physician: Dr. Bebe Shaggy PCP: Carylon Perches, MD the patient has not actually seen him yet.  Chief Complaint: Chest pain  HPI:  79 year old man presented to the emergency department with history of chest pain and left arm pain. Initial evaluation in the emergency department was unremarkable the patient was referred for admission for chest pain rule out.  History obtained from patient as well as his daughter at bedside. The patient has short-term memory deficits at baseline. His daughter has been living with him for the last 6 weeks. He has no history of cardiac disease and no previous history of cardiac testing. For the last 3 days he has had a few episodes of chest pain. These were short lived, less than 1 minute, described as sharp and not associated with shortness of breath. No specific aggravating or alleviating factors were identified. Maximum intensity was perhaps up to 4 or 5. Given these episodes were short lived he decided to stay at home. Today his daughter noted him to be bending over complaining of some chest discomfort and left arm pain. This quickly resolved prior to arrival to the emergency department. He is currently pain-free. He has no shortness of breath with this. No new nausea or vomiting.  He has a history of cognitive dysfunction and has been noncompliant with follow up in the past. He has been followed by gastroenterology an EGD was recommended. However it has been difficult for his daughter to get him to followup with doctors. He has been accepted by Dr. Ouida Sills however the patient has never actually seen him in clinic.  In the emergency department he was noted be afebrile with stable vital signs. Screening laboratory studies were unremarkable including troponin. EKG and chest x-ray were unremarkable. He was treated with aspirin.  Review of Systems:  Negative for fever, visual changes,  rash, nSOB, dysuria, bleeding  Positive for sore throat, cough  Past Medical History  Diagnosis Date  . Hyponatremia   . Small bowel obstruction   . Hearing deficit   . Arthritis   . Chronic back pain   . 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  . Anemia   . Dizziness   . Diverticulitis of colon 11/14/2011  . GERD (gastroesophageal reflux disease)   . Hernia     Past Surgical History  Procedure Laterality Date  . None    . No past surgeries    . Colonoscopy  04/08/2012    RMR: Pancolonic diverticulosis    Social History:  reports that he has quit smoking. His smoking use included Cigarettes. He smoked 0.30 packs per day. He quit smokeless tobacco use about 54 years ago. He reports that he drinks about 0.6 ounces of alcohol per week. He reports that he does not use illicit drugs.  Allergies  Allergen Reactions  . Aricept (Donepezil Hcl) Other (See Comments)    Confusion   . Lorazepam Other (See Comments)    Hallucinations, Violent Behavior    Family History  Problem Relation Age of Onset  . Colon cancer Neg Hx   . Cancer Mother     stomach?     Prior to Admission medications   Medication Sig Start Date End Date Taking? Authorizing Provider  levothyroxine (SYNTHROID, LEVOTHROID) 75 MCG tablet Take 75 mcg by mouth daily before breakfast.   Yes Historical Provider, MD  omeprazole (PRILOSEC) 20 MG capsule Take 1 capsule (20 mg total)  by mouth daily. 30 minutes before first meal of the day. For reflux. 03/29/12  Yes Nira Retort, NP  ondansetron (ZOFRAN) 4 MG tablet Take 1 tablet (4 mg total) by mouth every 8 (eight) hours as needed for nausea. 04/12/12  Yes Nira Retort, NP  polyethylene glycol powder (GLYCOLAX/MIRALAX) powder Take 17 g by mouth daily. For constipation 03/29/12  Yes Nira Retort, NP  traMADol (ULTRAM) 50 MG tablet Take 50 mg by mouth 2 (two) times daily.   Yes Historical Provider, MD   Physical Exam: Filed Vitals:   12/11/12 1139 12/11/12 1427   BP: 123/69 102/83  Pulse: 98 63  Temp: 98.3 F (36.8 C)   TempSrc: Oral   Resp: 18 16  SpO2: 97% 99%   General: Appears calm and comfortable, speech fluent and clear Eyes: PERRL, normal lids, irises. Wears glasses.  ENT: Hard of hearing. Normal lips and tongue. Neck: no LAD, masses or thyromegaly Cardiovascular: RRR, no m/r/g. No LE edema. Respiratory: CTA bilaterally, no w/r/r. Normal respiratory effort. Abdomen: soft, ntnd, bilateral inguinal hernias left greater than right noted. Easily Reducible and nontender bilaterally. Skin: no rash or induration seen  Musculoskeletal: grossly normal tone BUE/BLE. Ambulates easily. Strength upper and lower she was appears grossly normal. Psychiatric: grossly normal mood and affect, speech fluent and appropriate Neurologic: grossly non-focal.  Wt Readings from Last 3 Encounters:  11/09/12 68.2 kg (150 lb 5.7 oz)  04/29/12 69.9 kg (154 lb 1.6 oz)  04/17/12 72.576 kg (160 lb)    Labs on Admission:  Basic Metabolic Panel:  Recent Labs Lab 12/11/12 1151  NA 136  K 3.7  CL 99  CO2 27  GLUCOSE 98  BUN 6  CREATININE 0.83  CALCIUM 9.2   CBC:  Recent Labs Lab 12/11/12 1151  WBC 4.8  NEUTROABS 3.4  HGB 12.6*  HCT 36.2*  MCV 93.8  PLT 229    Cardiac Enzymes:  Recent Labs Lab 12/11/12 1151  TROPONINI <0.30    Radiological Exams on Admission: Dg Chest Portable 1 View  12/11/2012   *RADIOLOGY REPORT*  Clinical Data: Chest pain.  Anemia.  PORTABLE CHEST - 1 VIEW  Comparison: 11/09/2012  Findings: Left lower lobe scarring again seen.  Lungs otherwise clear.  No evidence of pleural effusion.  Heart size within normal limits.  IMPRESSION: Stable left lower lobe scarring.  No acute findings.   Original Report Authenticated By: Myles Rosenthal, M.D.    EKG: Independently reviewed. Normal sinus rhythm. No acute changes.   Principal Problem:   Atypical chest pain Active Problems:   Dementia   Abdominal pain, chronic, left lower  quadrant   Bilateral inguinal hernia   Assessment/Plan 1. Atypical chest pain, left arm pain: Currently pain-free initial evaluation unremarkable. No history of coronary disease. Serial cardiac enzymes. ACS is doubted. Unless has recurrent symptoms or further lab work is abnormal would consider discharge home with outpatient evaluation. Given known gastrointestinal symptoms suspect GI related. Encouraged to followup with gastroenterology pursue EGD as an outpatient as recommended in the past. No history to suggest VTE. Well's score 0. 2. Chronic abdominal pain, chronic nausea: Appears to be stable. Currently he denies any pain. 3. Dementia: Stable per chart. 4. Chronic bilateral inguinal hernias, no complicating features present: Followup as an outpatient.     5. Discussed with Dr. Ouida Sills by telephone. [facility] will continue to follow, he will follow-up as an outpatient.  Code Status: Full code Family Communication: Discussed with daughter at bedside Disposition  Plan/Anticipated LOS: Observation, one to 2 days  Time spent: 55 minutes  Brendia Sacks, MD  [facility] Pager [phone] 12/11/2012, 3:03 PM

## 2012-12-11 NOTE — ED Notes (Signed)
Report attempted to be given to [facility] on 300. Will call back

## 2012-12-12 DIAGNOSIS — K4020 Bilateral inguinal hernia, without obstruction or gangrene, not specified as recurrent: Secondary | ICD-10-CM | POA: Diagnosis not present

## 2012-12-12 DIAGNOSIS — R0789 Other chest pain: Secondary | ICD-10-CM | POA: Diagnosis not present

## 2012-12-12 DIAGNOSIS — R1032 Left lower quadrant pain: Secondary | ICD-10-CM | POA: Diagnosis not present

## 2012-12-12 DIAGNOSIS — G8929 Other chronic pain: Secondary | ICD-10-CM | POA: Diagnosis not present

## 2012-12-12 LAB — TROPONIN I: Troponin I: <0.3 ng/mL (ref <0.30)

## 2012-12-12 MED ORDER — ASPIRIN 81 MG PO TBEC: 81.0000 mg | DELAYED_RELEASE_TABLET | Freq: Every day | ORAL | Status: DC

## 2012-12-12 NOTE — Progress Notes (Signed)
[facility] PROGRESS NOTE  Larry Cox:[id number] DOB: [birth year] DOA: 12/11/2012 PCP: Larry Perches, MD  Assessment/Plan: 1. Atypical chest pain, left arm pain: No recurrence. Cardiac enzymes negative. No history of coronary disease. No evidence of ACS. No history to suggest VTE. Well's score 0. History is highly suggestive of GI origin. 2. Chronic abdominal pain, chronic nausea: These issues appear stable and by history or of long-standing. Exam is benign. I have encouraged him to followup with Larry Cox for consideration of EGD as was planned in the past. 3. Chronic bilateral inguinal hernia is: No complicating features present. He was previously evaluated by Larry Cox as an outpatient. 4. Dementia: Appears stable.   Discharge home today. I have encouraged him to followup with Larry Cox as well as Larry Cox. Will also offer outpatient cardiology evaluation although symptoms are suspected to be GI in origin.   Continue low-dose aspirin on discharge  Code Status: Full code DVT prophylaxis: SCDs  Family Communication: Discussed with daughter at bedside again 6/15 Disposition Plan: Home today  Brendia Sacks, MD  [facility]  Pager [phone] If 7PM-7AM, please contact night-coverage at [email or web address], password [facility] 12/12/2012, 10:57 AM  LOS: 1 day   Clinical Summary: 79 year old man presented to the emergency department with history of chest pain and left arm pain. Initial evaluation in the emergency department was unremarkable the patient was referred for admission for chest pain rule out.  Consultants:  None  Procedures:  None  HPI/Subjective: Afebrile, vital signs stable. No recurrent chest pain or left arm pain. No complaints of shortness of breath. Chronic nausea again present this morning as usual. Mild abdominal discomfort. No acute issues voiced. Daughter at bedside concurs.  Objective: Filed Vitals:   12/11/12 1520 12/11/12 1800 12/11/12 2152  12/12/12 0410  BP: 138/63 147/61 132/60 110/62  Pulse: 67 64 64 69  Temp: 98.1 F (36.7 C) 98.7 F (37.1 C) 97.4 F (36.3 C) 97.5 F (36.4 C)  TempSrc: Oral Oral Oral Oral  Resp: 18 18 19 16   Height: 5\' 8"  (1.727 m)     Weight: 66.769 kg (147 lb 3.2 oz)   68.5 kg (151 lb 0.2 oz)  SpO2: 96% 100% 97% 98%    Intake/Output Summary (Last 24 hours) at 12/12/12 1057 Last data filed at 12/11/12 1700  Gross per 24 hour  Intake    240 ml  Output      0 ml  Net    240 ml     Filed Weights   12/11/12 1520 12/12/12 0410  Weight: 66.769 kg (147 lb 3.2 oz) 68.5 kg (151 lb 0.2 oz)    Exam:  General: Appears calm and comfortable. Sitting in chair. Cardiovascular regular rate and rhythm. No murmur, rub, gallop. Telemetry sinus rhythm, no arrhythmias Respiratory clear to auscultation bilaterally. No wheezes, rales, rhonchi. Normal respiratory effort. ENT hard of hearing. Abdomen soft, nontender, nondistended  Data Reviewed:  Troponins negative.  Pending studies:  None  Scheduled Meds: . aspirin EC  81 mg Oral Daily  . levothyroxine  75 mcg Oral QAC breakfast  . pantoprazole  40 mg Oral Daily  . polyethylene glycol  17 g Oral Daily  . sodium chloride  3 mL Intravenous Q12H  . sodium chloride  3 mL Intravenous Q12H  . traMADol  50 mg Oral BID   Continuous Infusions:   Principal Problem:   Atypical chest pain Active Problems:   Dementia   Abdominal pain, chronic, left lower quadrant   Bilateral  inguinal hernia

## 2012-12-12 NOTE — Plan of Care (Signed)
Problem: Phase I Progression Outcomes Goal: Anginal pain relieved Outcome: Completed/Met Date Met:  12/12/12 Denies chest pain Goal: MD aware of Cardiac Marker results Outcome: Completed/Met Date Met:  12/12/12 Troponin neg  Problem: Discharge Progression Outcomes Goal: Other Discharge Outcomes/Goals Outcome: Completed/Met Date Met:  12/12/12 Discharged to home with daughter Daughter will call to arrange appointments

## 2012-12-12 NOTE — Discharge Summary (Signed)
Physician Discharge Summary  Larry Cox ZOX:[id number] DOB: 1921-08-06 DOA: 12/11/2012  PCP: Carylon Perches, MD Gastroenterologist: Dr. Kendell Bane  Admit date: 12/11/2012 Discharge date: 12/12/2012  Recommendations for Outpatient Follow-up:  1. Followup atypical chest pain, GI etiology suspected 2. Chronic abdominal pain and nausea, further evaluation deferred to gastroenterology   Follow-up Information   Follow up with Carylon Perches, MD. Schedule an appointment as soon as possible for a visit in 1 week.   Contact information:   [address] [place] [address] [phone]       Follow up with [name], MD. Schedule an appointment as soon as possible for a visit in 2 weeks.   Contact information:   [address] [place] [address] [phone]       Follow up with [place] Bing, MD. Schedule an appointment as soon as possible for a visit in 2 weeks.   Contact information:   [address] [place] [address] [phone]      Discharge Diagnoses:  1. Atypical chest pain 2. Chronic abdominal pain and nausea 3. Chronic bilateral inguinal hernia without complicating features 4. Dementia without behavioral disturbance  Discharge Condition: Improved Disposition: Home  Diet recommendation: Regular  Filed Weights   12/11/12 1520 12/12/12 0410  Weight: 66.769 kg (147 lb 3.2 oz) 68.5 kg (151 lb 0.2 oz)    History of present illness:  79 year old man presented to the emergency department with history of chest pain and left arm pain. Initial evaluation in the emergency department was unremarkable the patient was referred for admission for chest pain rule out.  Hospital Course:  Mr. Tomasik was admitted to the medical floor for further evaluation of chest pain. He ruled out with serial cardiac enzymes and telemetry was unremarkable. He had no recurrence of pain and is felt to be stable for discharge. Given his history  and symptoms most likely etiology is gastrointestinal in origin and outpatient followup has been recommended. I discussed with his daughter the likely etiologies but encouraged her to seek further evaluation or emergency department care for recurrent symptoms.  1. Atypical chest pain, left arm pain: No recurrence. Cardiac enzymes negative. No history of coronary disease. No evidence of ACS. No history to suggest VTE. Well's score 0. History is highly suggestive of GI origin. 2. Chronic abdominal pain, chronic nausea: These issues appear stable and by history or of long-standing. Exam is benign. I have encouraged him to followup with Dr. Kendell Bane for consideration of EGD as was planned in the past. 3. Chronic bilateral inguinal hernia is: No complicating features present. He was previously evaluated by Dr. Lovell Sheehan as an outpatient. 4. Dementia: Appears stable.  Consultants:  None Procedures:  None  Discharge Instructions  Discharge Orders   Future Orders Complete By Expires     Diet general  As directed     Discharge instructions  As directed     Comments:      It is strongly recommended that you followup with Dr. Ouida Sills to establish care and followup with Dr. Kendell Bane as well. You have also been referred to a cardiologist for followup. If at any time you develop recurrent chest pain or worsening of your condition call your physician or seek immediate medical attention.    Increase activity slowly  As directed         Medication List    TAKE these medications       aspirin 81 MG EC  tablet  Take 1 tablet (81 mg total) by mouth daily.     levothyroxine 75 MCG tablet  Commonly known as:  SYNTHROID, LEVOTHROID  Take 75 mcg by mouth daily before breakfast.     omeprazole 20 MG capsule  Commonly known as:  PRILOSEC  Take 1 capsule (20 mg total) by mouth daily. 30 minutes before first meal of the day. For reflux.     ondansetron 4 MG tablet  Commonly known as:  ZOFRAN  Take 1 tablet (4 mg  total) by mouth every 8 (eight) hours as needed for nausea.     polyethylene glycol powder powder  Commonly known as:  GLYCOLAX/MIRALAX  Take 17 g by mouth daily. For constipation     traMADol 50 MG tablet  Commonly known as:  ULTRAM  Take 50 mg by mouth 2 (two) times daily.       Allergies  Allergen Reactions  . Aricept (Donepezil Hcl) Other (See Comments)    Confusion   . Lorazepam Other (See Comments)    Hallucinations, Violent Behavior    The results of significant diagnostics from this hospitalization (including imaging, microbiology, ancillary and laboratory) are listed below for reference.    Significant Diagnostic Studies: Dg Chest Portable 1 View  12/11/2012   *RADIOLOGY REPORT*  Clinical Data: Chest pain.  Anemia.  PORTABLE CHEST - 1 VIEW  Comparison: 11/09/2012  Findings: Left lower lobe scarring again seen.  Lungs otherwise clear.  No evidence of pleural effusion.  Heart size within normal limits.  IMPRESSION: Stable left lower lobe scarring.  No acute findings.   Original Report Authenticated By: Myles Rosenthal, M.D.   Labs: Basic Metabolic Panel:  Recent Labs Lab 12/11/12 1151  NA 136  K 3.7  CL 99  CO2 27  GLUCOSE 98  BUN 6  CREATININE 0.83  CALCIUM 9.2   CBC:  Recent Labs Lab 12/11/12 1151  WBC 4.8  NEUTROABS 3.4  HGB 12.6*  HCT 36.2*  MCV 93.8  PLT 229   Cardiac Enzymes:  Recent Labs Lab 12/11/12 1151 12/11/12 1530 12/11/12 2126 12/12/12 0500  TROPONINI <0.30 <0.30 <0.30 <0.30    Principal Problem:   Atypical chest pain Active Problems:   Dementia   Abdominal pain, chronic, left lower quadrant   Bilateral inguinal hernia   Time coordinating discharge: 20 minutes  Signed:  Brendia Sacks, MD [facility] 12/12/2012, 11:08 AM

## 2012-12-17 ENCOUNTER — Other Ambulatory Visit: Payer: Self-pay | Admitting: Gastroenterology

## 2012-12-17 NOTE — Telephone Encounter (Signed)
Pt's daughter requesting the generic zofran to be called into [facility] in [place]. Pt is out of nausea medicine

## 2012-12-20 MED ORDER — ONDANSETRON HCL 4 MG PO TABS: 4.0000 mg | ORAL_TABLET | Freq: Three times a day (TID) | ORAL | Status: DC | PRN

## 2012-12-20 NOTE — Telephone Encounter (Signed)
done

## 2012-12-24 DIAGNOSIS — E039 Hypothyroidism, unspecified: Secondary | ICD-10-CM | POA: Diagnosis not present

## 2012-12-24 DIAGNOSIS — F0280 Dementia in other diseases classified elsewhere without behavioral disturbance: Secondary | ICD-10-CM | POA: Diagnosis not present

## 2012-12-24 DIAGNOSIS — R1084 Generalized abdominal pain: Secondary | ICD-10-CM | POA: Diagnosis not present

## 2012-12-28 ENCOUNTER — Ambulatory Visit: Payer: Medicare Other | Admitting: Gastroenterology

## 2013-01-04 ENCOUNTER — Emergency Department (HOSPITAL_COMMUNITY)
Admission: EM | Admit: 2013-01-04 | Discharge: 2013-01-04 | Disposition: A | Discharge status: Home / Self Care | Payer: Medicare Other | Attending: Emergency Medicine | Admitting: Emergency Medicine

## 2013-01-04 ENCOUNTER — Emergency Department (HOSPITAL_COMMUNITY): Payer: Medicare Other

## 2013-01-04 ENCOUNTER — Encounter (HOSPITAL_COMMUNITY): Payer: Self-pay | Admitting: Emergency Medicine

## 2013-01-04 DIAGNOSIS — Z79899 Other long term (current) drug therapy: Secondary | ICD-10-CM | POA: Diagnosis not present

## 2013-01-04 DIAGNOSIS — K219 Gastro-esophageal reflux disease without esophagitis: Secondary | ICD-10-CM | POA: Diagnosis not present

## 2013-01-04 DIAGNOSIS — Z7982 Long term (current) use of aspirin: Secondary | ICD-10-CM | POA: Diagnosis not present

## 2013-01-04 DIAGNOSIS — F0390 Unspecified dementia without behavioral disturbance: Secondary | ICD-10-CM | POA: Insufficient documentation

## 2013-01-04 DIAGNOSIS — R109 Unspecified abdominal pain: Secondary | ICD-10-CM | POA: Insufficient documentation

## 2013-01-04 DIAGNOSIS — Z8679 Personal history of other diseases of the circulatory system: Secondary | ICD-10-CM | POA: Insufficient documentation

## 2013-01-04 DIAGNOSIS — G8929 Other chronic pain: Secondary | ICD-10-CM | POA: Diagnosis not present

## 2013-01-04 DIAGNOSIS — Z87891 Personal history of nicotine dependence: Secondary | ICD-10-CM | POA: Insufficient documentation

## 2013-01-04 DIAGNOSIS — Z862 Personal history of diseases of the blood and blood-forming organs and certain disorders involving the immune mechanism: Secondary | ICD-10-CM | POA: Insufficient documentation

## 2013-01-04 DIAGNOSIS — M129 Arthropathy, unspecified: Secondary | ICD-10-CM | POA: Diagnosis not present

## 2013-01-04 DIAGNOSIS — Z8669 Personal history of other diseases of the nervous system and sense organs: Secondary | ICD-10-CM | POA: Insufficient documentation

## 2013-01-04 DIAGNOSIS — Z8719 Personal history of other diseases of the digestive system: Secondary | ICD-10-CM | POA: Insufficient documentation

## 2013-01-04 DIAGNOSIS — Z9889 Other specified postprocedural states: Secondary | ICD-10-CM | POA: Insufficient documentation

## 2013-01-04 DIAGNOSIS — R110 Nausea: Secondary | ICD-10-CM

## 2013-01-04 DIAGNOSIS — Z8639 Personal history of other endocrine, nutritional and metabolic disease: Secondary | ICD-10-CM | POA: Insufficient documentation

## 2013-01-04 DIAGNOSIS — R42 Dizziness and giddiness: Secondary | ICD-10-CM | POA: Diagnosis not present

## 2013-01-04 HISTORY — DX: Other chronic pain: G89.29

## 2013-01-04 HISTORY — DX: Nausea: R11.0

## 2013-01-04 HISTORY — DX: Unspecified abdominal pain: R10.9

## 2013-01-04 LAB — URINALYSIS W MICROSCOPIC + REFLEX CULTURE
Bilirubin Urine: NEGATIVE
Hgb urine dipstick: NEGATIVE
Specific Gravity, Urine: <1.005 — ABNORMAL LOW (ref 1.005–1.030)
pH: 7.5 (ref 5.0–8.0)

## 2013-01-04 LAB — CBC
Hemoglobin: 12.5 g/dL — ABNORMAL LOW (ref 13.0–17.0)
MCH: 32.7 pg (ref 26.0–34.0)
RBC: 3.82 MIL/uL — ABNORMAL LOW (ref 4.22–5.81)

## 2013-01-04 LAB — LIPASE, BLOOD: Lipase: 75 U/L — ABNORMAL HIGH (ref 11–59)

## 2013-01-04 LAB — HEPATIC FUNCTION PANEL
ALT: 17 U/L (ref 0–53)
AST: 21 U/L (ref 0–37)
Albumin: 3.9 g/dL (ref 3.5–5.2)
Total Protein: 7 g/dL (ref 6.0–8.3)

## 2013-01-04 LAB — BASIC METABOLIC PANEL
CO2: 28 mEq/L (ref 19–32)
Glucose, Bld: 113 mg/dL — ABNORMAL HIGH (ref 70–99)
Potassium: 4.2 mEq/L (ref 3.5–5.1)
Sodium: 130 mEq/L — ABNORMAL LOW (ref 135–145)

## 2013-01-04 MED ORDER — ONDANSETRON HCL 4 MG/2ML IJ SOLN
4.0000 mg | INTRAMUSCULAR | Status: DC | PRN
  Administered 2013-01-04: 4 mg via INTRAVENOUS
  Filled 2013-01-04: qty 2

## 2013-01-04 MED ORDER — GI COCKTAIL ~~LOC~~
30.0000 mL | Freq: Once | ORAL | Status: AC
  Administered 2013-01-04: 30 mL via ORAL
  Filled 2013-01-04: qty 30

## 2013-01-04 MED ORDER — FAMOTIDINE IN NACL 20-0.9 MG/50ML-% IV SOLN
20.0000 mg | Freq: Once | INTRAVENOUS | Status: AC
  Administered 2013-01-04: 20 mg via INTRAVENOUS
  Filled 2013-01-04: qty 50

## 2013-01-04 MED ORDER — ONDANSETRON HCL 4 MG PO TABS: 4.0000 mg | ORAL_TABLET | Freq: Three times a day (TID) | ORAL | Status: DC | PRN

## 2013-01-04 MED ORDER — ACETAMINOPHEN 325 MG PO TABS
650.0000 mg | ORAL_TABLET | Freq: Once | ORAL | Status: AC
  Administered 2013-01-04: 650 mg via ORAL
  Filled 2013-01-04: qty 2

## 2013-01-04 MED ORDER — SODIUM CHLORIDE 0.9 % IV SOLN
INTRAVENOUS | Status: DC
  Administered 2013-01-04: 09:00:00 via INTRAVENOUS

## 2013-01-04 NOTE — ED Notes (Signed)
Pt c/o dizziness and nausea. Hx of dementia. Pt ambulating well in room. Alert/oriented to most. No gen weakness noted. Denies v/d. Daughter states pt was coughing this am and was given mucinex. Was given zofran at 430am with no relief. Pt denies pain.

## 2013-01-04 NOTE — ED Notes (Signed)
Water provided for PO challenge

## 2013-01-04 NOTE — ED Notes (Addendum)
Contacted Child psychotherapist for information for family regarding dementia care and support.  Local support group information given to daughter, advised to contact Le[facility] [place] and provided number.

## 2013-01-04 NOTE — ED Notes (Signed)
Pt requests pain medication, MD made aware, see MAR.

## 2013-01-04 NOTE — ED Provider Notes (Signed)
History    CSN: [id number] Arrival date & time 01/04/13  0746  First MD Initiated Contact with Patient 01/04/13 0802     Chief Complaint  Patient presents with  . Dizziness  . Nausea    The history is provided by the patient and a caregiver. The history is limited by the condition of the patient (Hx dementia).  Pt was seen at 0830.  Per pt and his caregiver, c/o gradual onset and persistence of constant acute flair of his chronic nausea and abd "pain" since last night. Pt has long hx of same symptoms with extensive evaluation and no definitive diagnosis. Pt's family states they have not f/u with GI MD as instructed after pt's last hospital admission last month. Pt has hx of dementia and currently does not have any complaints.  Denies CP/palpitations, no cough/SOB, no back pain, no diarrhea, no black or blood in stools or emesis, no fevers.    Past Medical History  Diagnosis Date  . Hyponatremia   . Small bowel obstruction   . Hearing deficit   . Arthritis   . Chronic back pain   . 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  . Anemia   . Dizziness   . Diverticulitis of colon 11/14/2011  . GERD (gastroesophageal reflux disease)   . Hernia     hiatal and inguinal  . Chronic abdominal pain   . Chronic nausea   . Memory deficits    Past Surgical History  Procedure Laterality Date  . Colonoscopy  04/08/2012    RMR: Pancolonic diverticulosis   Family History  Problem Relation Age of Onset  . Colon cancer Neg Hx   . Cancer Mother     stomach?   History  Substance Use Topics  . Smoking status: Former Smoker -- 0.30 packs/day    Types: Cigarettes  . Smokeless tobacco: Former Neurosurgeon    Quit date: 07/29/1958  . Alcohol Use: 0.6 oz/week    1 Cans of beer per week     Comment: 1 beer occasionally    Review of Systems  Unable to perform ROS: Dementia       Allergies  Aricept; Diazepam; and Lorazepam  Home Medications   Current Outpatient Rx  Name  Route   Sig  Dispense  Refill  . aspirin 325 MG tablet   Oral   Take 325 mg by mouth daily as needed for pain.         Marland Kitchen guaiFENesin (MUCINEX) 600 MG 12 hr tablet   Oral   Take 1,200 mg by mouth 2 (two) times daily.         Marland Kitchen levothyroxine (SYNTHROID, LEVOTHROID) 75 MCG tablet   Oral   Take 75 mcg by mouth daily before breakfast.         . omeprazole (PRILOSEC) 40 MG capsule   Oral   Take 40 mg by mouth daily.         . ondansetron (ZOFRAN) 4 MG tablet   Oral   Take 1 tablet (4 mg total) by mouth every 8 (eight) hours as needed for nausea.   20 tablet   0   . traMADol (ULTRAM) 50 MG tablet   Oral   Take 50 mg by mouth 2 (two) times daily as needed for pain.           BP 150/83  Pulse 83  Temp(Src) 97.6 F (36.4 C) (Oral)  Resp 18  Ht 5\' 8"  (1.727  m)  Wt 150 lb (68.04 kg)  BMI 22.81 kg/m2  SpO2 98% Physical Exam 0835: Physical examination:  Nursing notes reviewed; Vital signs and O2 SAT reviewed;  Constitutional: Well developed, Well nourished, Well hydrated, In no acute distress; Head:  Normocephalic, atraumatic; Eyes: EOMI, PERRL, No scleral icterus; ENMT: Mouth and pharynx normal, Mucous membranes moist; Neck: Supple, Full range of motion, No lymphadenopathy; Cardiovascular: Regular rate and rhythm, No gallop; Respiratory: Breath sounds clear & equal bilaterally, No rales, rhonchi, wheezes.  Speaking full sentences with ease, Normal respiratory effort/excursion; Chest: Nontender, Movement normal; Abdomen: Soft, Nontender, Nondistended, Normal bowel sounds; Genitourinary: No CVA tenderness; Extremities: Pulses normal, No tenderness, No edema, No calf edema or asymmetry.; Neuro: Awake, alert, confused re: time, place, events. Major CN grossly intact. No facial droop. Speech clear. Climbs on and off stretcher easily by himself. Gait steady. No gross focal motor or sensory deficits in extremities.; Skin: Color normal, Warm, Dry.   ED Course  Procedures     MDM   MDM Reviewed: previous chart, nursing note and vitals Reviewed previous: labs and ECG Interpretation: labs, ECG and x-ray    Date: 01/04/2013  Rate: 74  Rhythm: normal sinus rhythm  QRS Axis: normal  Intervals: normal  ST/T Wave abnormalities: normal  Conduction Disutrbances:none  Narrative Interpretation:   Old EKG Reviewed: unchanged; no significant changes from previous EKG dated 12/11/2012.  Results for orders placed during the hospital encounter of 01/04/13  CBC      Result Value Range   WBC 4.8  4.0 - 10.5 K/uL   RBC 3.82 (*) 4.22 - 5.81 MIL/uL   Hemoglobin 12.5 (*) 13.0 - 17.0 g/dL   HCT 16.1 (*) 09.6 - 04.5 %   MCV 91.9  78.0 - 100.0 fL   MCH 32.7  26.0 - 34.0 pg   MCHC 35.6  30.0 - 36.0 g/dL   RDW 40.9  81.1 - 91.4 %   Platelets 269  150 - 400 K/uL  BASIC METABOLIC PANEL      Result Value Range   Sodium 130 (*) 135 - 145 mEq/L   Potassium 4.2  3.5 - 5.1 mEq/L   Chloride 95 (*) 96 - 112 mEq/L   CO2 28  19 - 32 mEq/L   Glucose, Bld 113 (*) 70 - 99 mg/dL   BUN 9  6 - 23 mg/dL   Creatinine, Ser 7.82  0.50 - 1.35 mg/dL   Calcium 9.2  8.4 - 95.6 mg/dL   GFR calc non Af Amer 75 (*) >90 mL/min   GFR calc Af Amer 87 (*) >90 mL/min  TROPONIN I      Result Value Range   Troponin I <0.30  <0.30 ng/mL  LIPASE, BLOOD      Result Value Range   Lipase 75 (*) 11 - 59 U/L  HEPATIC FUNCTION PANEL      Result Value Range   Total Protein 7.0  6.0 - 8.3 g/dL   Albumin 3.9  3.5 - 5.2 g/dL   AST 21  0 - 37 U/L   ALT 17  0 - 53 U/L   Alkaline Phosphatase 62  39 - 117 U/L   Total Bilirubin 0.4  0.3 - 1.2 mg/dL   Bilirubin, Direct <2.1  0.0 - 0.3 mg/dL   Indirect Bilirubin NOT CALCULATED  0.3 - 0.9 mg/dL  URINALYSIS W MICROSCOPIC + REFLEX CULTURE      Result Value Range   Color, Urine YELLOW  YELLOW  APPearance CLEAR  CLEAR   Specific Gravity, Urine <1.005 (*) 1.005 - 1.030   pH 7.5  5.0 - 8.0   Glucose, UA NEGATIVE  NEGATIVE mg/dL   Hgb urine dipstick NEGATIVE   NEGATIVE   Bilirubin Urine NEGATIVE  NEGATIVE   Ketones, ur NEGATIVE  NEGATIVE mg/dL   Protein, ur NEGATIVE  NEGATIVE mg/dL   Urobilinogen, UA 0.2  0.0 - 1.0 mg/dL   Nitrite NEGATIVE  NEGATIVE   Leukocytes, UA NEGATIVE  NEGATIVE   Dg Abd Acute W/chest 01/04/2013   *RADIOLOGY REPORT*  Clinical Data: Dizziness, nausea, abdominal pain.  ACUTE ABDOMEN SERIES (ABDOMEN 2 VIEW & CHEST 1 VIEW)  Comparison: 12/11/2012 chest radiograph, 11/09/2012 abdominal CT  Findings: Cardiomediastinal contours within normal limits.  Linear left lung base opacity, likely atelectasis or scarring.  Chronic increased interstitial markings.  Nonspecific bowel gas pattern without overt evidence for obstruction. Organ outlines normal where seen.  Leftward curvature of the lumbar spine with advanced multilevel degenerative changes.  IMPRESSION: Nonspecific bowel gas pattern without overt evidence for obstruction.  Linear left lung base opacity, likely atelectasis or scarring.   Original Report Authenticated By: Jearld Lesch, M.D.    1145:  Pt has tol PO well while in the ED without N/V.  No stooling while in the ED.  Abd remains benign, VSS. Wants to go home now. Pt has extensive hx of similar symptoms previously, as well as chronic hyponatremia, per EPIC chart review: dx with questionable psychogenic vomiting or either cough with expectoration of sputum vs emesis with GI f/u recommended.  Pt given GI cocktail, tylenol, pepcid and zofran with improvement in his symptoms.  Dx and testing d/w pt and family.  Questions answered.  Verb understanding, agreeable to d/c home. Strongly encouraged to f/u with GI MD as previously instructed.          Laray Anger, DO 01/07/13 1355

## 2013-01-11 DIAGNOSIS — E569 Vitamin deficiency, unspecified: Secondary | ICD-10-CM | POA: Diagnosis not present

## 2013-01-11 DIAGNOSIS — Z79899 Other long term (current) drug therapy: Secondary | ICD-10-CM | POA: Diagnosis not present

## 2013-01-11 DIAGNOSIS — F0390 Unspecified dementia without behavioral disturbance: Secondary | ICD-10-CM | POA: Diagnosis not present

## 2013-01-11 DIAGNOSIS — E871 Hypo-osmolality and hyponatremia: Secondary | ICD-10-CM | POA: Diagnosis not present

## 2013-01-11 DIAGNOSIS — E039 Hypothyroidism, unspecified: Secondary | ICD-10-CM | POA: Diagnosis not present

## 2013-01-17 DIAGNOSIS — F0390 Unspecified dementia without behavioral disturbance: Secondary | ICD-10-CM | POA: Diagnosis not present

## 2013-01-17 DIAGNOSIS — E569 Vitamin deficiency, unspecified: Secondary | ICD-10-CM | POA: Diagnosis not present

## 2013-01-17 DIAGNOSIS — E039 Hypothyroidism, unspecified: Secondary | ICD-10-CM | POA: Diagnosis not present

## 2013-01-17 DIAGNOSIS — E871 Hypo-osmolality and hyponatremia: Secondary | ICD-10-CM | POA: Diagnosis not present

## 2013-01-17 DIAGNOSIS — Z79899 Other long term (current) drug therapy: Secondary | ICD-10-CM | POA: Diagnosis not present

## 2013-01-25 DIAGNOSIS — E039 Hypothyroidism, unspecified: Secondary | ICD-10-CM | POA: Diagnosis not present

## 2013-01-25 DIAGNOSIS — E871 Hypo-osmolality and hyponatremia: Secondary | ICD-10-CM | POA: Diagnosis not present

## 2013-01-25 DIAGNOSIS — F0281 Dementia in other diseases classified elsewhere with behavioral disturbance: Secondary | ICD-10-CM | POA: Diagnosis not present

## 2013-02-02 ENCOUNTER — Other Ambulatory Visit: Payer: Self-pay

## 2013-02-18 ENCOUNTER — Emergency Department (HOSPITAL_COMMUNITY)
Admission: EM | Admit: 2013-02-18 | Discharge: 2013-02-18 | Disposition: A | Discharge status: Home / Self Care | Payer: Medicare Other | Attending: Emergency Medicine | Admitting: Emergency Medicine

## 2013-02-18 ENCOUNTER — Encounter (HOSPITAL_COMMUNITY): Payer: Self-pay | Admitting: *Deleted

## 2013-02-18 DIAGNOSIS — R5383 Other fatigue: Secondary | ICD-10-CM

## 2013-02-18 DIAGNOSIS — R109 Unspecified abdominal pain: Secondary | ICD-10-CM

## 2013-02-18 DIAGNOSIS — Z8639 Personal history of other endocrine, nutritional and metabolic disease: Secondary | ICD-10-CM | POA: Insufficient documentation

## 2013-02-18 DIAGNOSIS — Z79899 Other long term (current) drug therapy: Secondary | ICD-10-CM | POA: Insufficient documentation

## 2013-02-18 DIAGNOSIS — R5381 Other malaise: Secondary | ICD-10-CM | POA: Insufficient documentation

## 2013-02-18 DIAGNOSIS — H9190 Unspecified hearing loss, unspecified ear: Secondary | ICD-10-CM | POA: Insufficient documentation

## 2013-02-18 DIAGNOSIS — Z8719 Personal history of other diseases of the digestive system: Secondary | ICD-10-CM | POA: Insufficient documentation

## 2013-02-18 DIAGNOSIS — G8929 Other chronic pain: Secondary | ICD-10-CM | POA: Insufficient documentation

## 2013-02-18 DIAGNOSIS — R110 Nausea: Secondary | ICD-10-CM | POA: Insufficient documentation

## 2013-02-18 DIAGNOSIS — F0390 Unspecified dementia without behavioral disturbance: Secondary | ICD-10-CM | POA: Insufficient documentation

## 2013-02-18 DIAGNOSIS — Z862 Personal history of diseases of the blood and blood-forming organs and certain disorders involving the immune mechanism: Secondary | ICD-10-CM | POA: Diagnosis not present

## 2013-02-18 DIAGNOSIS — Z8669 Personal history of other diseases of the nervous system and sense organs: Secondary | ICD-10-CM | POA: Diagnosis not present

## 2013-02-18 DIAGNOSIS — K219 Gastro-esophageal reflux disease without esophagitis: Secondary | ICD-10-CM | POA: Insufficient documentation

## 2013-02-18 DIAGNOSIS — M549 Dorsalgia, unspecified: Secondary | ICD-10-CM | POA: Insufficient documentation

## 2013-02-18 DIAGNOSIS — M129 Arthropathy, unspecified: Secondary | ICD-10-CM | POA: Insufficient documentation

## 2013-02-18 LAB — CBC WITH DIFFERENTIAL/PLATELET
Eosinophils Absolute: 0.1 10*3/uL (ref 0.0–0.7)
Eosinophils Relative: 1 % (ref 0–5)
Hemoglobin: 13.6 g/dL (ref 13.0–17.0)
Lymphocytes Relative: 17 % (ref 12–46)
Lymphs Abs: 1 10*3/uL (ref 0.7–4.0)
MCH: 31.7 pg (ref 26.0–34.0)
MCV: 92.5 fL (ref 78.0–100.0)
Monocytes Relative: 11 % (ref 3–12)
Neutrophils Relative %: 71 % (ref 43–77)
RBC: 4.29 MIL/uL (ref 4.22–5.81)

## 2013-02-18 LAB — URINALYSIS, ROUTINE W REFLEX MICROSCOPIC
Bilirubin Urine: NEGATIVE
Glucose, UA: NEGATIVE mg/dL
Ketones, ur: NEGATIVE mg/dL
Nitrite: NEGATIVE
Specific Gravity, Urine: 1.01 (ref 1.005–1.030)
pH: 8 (ref 5.0–8.0)

## 2013-02-18 LAB — COMPREHENSIVE METABOLIC PANEL
Alkaline Phosphatase: 70 U/L (ref 39–117)
BUN: 6 mg/dL (ref 6–23)
CO2: 25 mEq/L (ref 19–32)
GFR calc Af Amer: 90 mL/min — ABNORMAL LOW (ref >90)
GFR calc non Af Amer: 77 mL/min — ABNORMAL LOW (ref >90)
Glucose, Bld: 102 mg/dL — ABNORMAL HIGH (ref 70–99)
Potassium: 3.7 mEq/L (ref 3.5–5.1)
Total Protein: 7.9 g/dL (ref 6.0–8.3)

## 2013-02-18 LAB — LACTIC ACID, PLASMA: Lactic Acid, Venous: 2.1 mmol/L (ref 0.5–2.2)

## 2013-02-18 MED ORDER — ONDANSETRON 8 MG PO TBDP
8.0000 mg | ORAL_TABLET | Freq: Once | ORAL | Status: AC
  Administered 2013-02-18: 8 mg via ORAL
  Filled 2013-02-18: qty 1

## 2013-02-18 NOTE — ED Notes (Signed)
Left sided abd pain with severe nausea x 3-4 days.  Also reports fatigue and constipation.  Denies vomiting.

## 2013-02-18 NOTE — ED Provider Notes (Signed)
CSN: [id number]     Arrival date & time 02/18/13  1002 History    This chart was scribed for Joya Gaskins, MD by Blanchard Kelch, ED Scribe. The patient was seen in room APA12/APA12. Patient's care was started at 10:31 AM.    Chief Complaint  Patient presents with  . Abdominal Pain  . Fatigue    Patient is a 79 y.o. male presenting with abdominal pain. The history is provided by the patient and a relative. The history is limited by the condition of the patient. No language interpreter was used.  Abdominal Pain Pain location: left-side. Pain radiates to:  Does not radiate Pain severity:  Severe Duration:  4 days Timing:  Intermittent Progression:  Worsening Chronicity:  Chronic Relieved by:  Nothing Worsened by:  Nothing tried Ineffective treatments:  Eating and OTC medications (nausea medication)   Level 5 caveat due to dementia. HPI Comments: Larry Cox, a 79 y.o. male with a history of dementia, presents to the Emergency Department complaining of worsening, intermittent (occurs every morning) nausea and left-sided abdominal pain that has been ongoing for months but recently worsened 3-4 days ago. Patient has moderate associated weakness and patient's daughter states he is unable to stand up on his own for more than 10 minutes at a time but this is not acute today. Patient took oral nausea medication and ate crackers for symptoms this morning without relief. Since reaching the hospital patient denies symptoms and is animated, but his daughter states this pain this morning is not usual. Patient also reports neck and back pain that could be due to sleeping in a chair last night. He has chronic issues with constipation. Patient denies vomiting, syncope and diarrhea.   Past Medical History  Diagnosis Date  . Hyponatremia   . Small bowel obstruction   . Hearing deficit   . Arthritis   . Chronic back pain   . 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  . Anemia   .  Dizziness   . Diverticulitis of colon 11/14/2011  . GERD (gastroesophageal reflux disease)   . Hernia     hiatal and inguinal  . Chronic abdominal pain   . Chronic nausea   . Memory deficits    Past Surgical History  Procedure Laterality Date  . Colonoscopy  04/08/2012    RMR: Pancolonic diverticulosis   Family History  Problem Relation Age of Onset  . Colon cancer Neg Hx   . Cancer Mother     stomach?   History  Substance Use Topics  . Smoking status: Former Smoker -- 0.30 packs/day    Types: Cigarettes  . Smokeless tobacco: Former Neurosurgeon    Quit date: 07/29/1958  . Alcohol Use: 0.6 oz/week    1 Cans of beer per week     Comment: 1 beer occasionally    Review of Systems  Unable to perform ROS: Dementia  Gastrointestinal: Positive for abdominal pain.    Allergies  Aricept; Diazepam; and Lorazepam  Home Medications   Current Outpatient Rx  Name  Route  Sig  Dispense  Refill  . aspirin 325 MG tablet   Oral   Take 325 mg by mouth daily as needed for pain.         Marland Kitchen guaiFENesin (MUCINEX) 600 MG 12 hr tablet   Oral   Take 1,200 mg by mouth 2 (two) times daily.         Marland Kitchen levothyroxine (SYNTHROID, LEVOTHROID) 75 MCG  tablet   Oral   Take 75 mcg by mouth daily before breakfast.         . omeprazole (PRILOSEC) 40 MG capsule   Oral   Take 40 mg by mouth daily.         . ondansetron (ZOFRAN) 4 MG tablet   Oral   Take 1 tablet (4 mg total) by mouth every 8 (eight) hours as needed for nausea.   20 tablet   0   . ondansetron (ZOFRAN) 4 MG tablet   Oral   Take 1 tablet (4 mg total) by mouth every 8 (eight) hours as needed for nausea.   6 tablet   0   . traMADol (ULTRAM) 50 MG tablet   Oral   Take 50 mg by mouth 2 (two) times daily as needed for pain.           BP 144/66  Pulse 124  Temp(Src) 98.3 F (36.8 C) (Oral)  Resp 20  SpO2 99%  Physical Exam  CONSTITUTIONAL: Well developed/well nourished HEAD: Normocephalic/atraumatic EYES:  EOMI/PERRL ENMT: Mucous membranes moist NECK: supple no meningeal signs SPINE:entire spine nontender CV: S1/S2 noted, no murmurs/rubs/gallops noted LUNGS: Lungs are clear to auscultation bilaterally, no apparent distress ABDOMEN: soft, nontender, no rebound or guarding GU:no cva tenderness. No testicular tenderness. No inguinal hernia NEURO: Pt is awake/alert, moves all extremitiesx4. Patient is able to ambulate. Pleasantly demented. He is in no distress EXTREMITIES: pulses normal, full ROM SKIN: warm, color normal PSYCH: no abnormalities of mood noted   ED Course   Medications  ondansetron (ZOFRAN-ODT) disintegrating tablet 8 mg (not administered)    DIAGNOSTIC STUDIES:   COORDINATION OF CARE:  10:43 AM -Ordered EKG, urinalysis, CBC, CMP, lactic acid, plasma, lipase and troponin screening.  Patient and relative verbalize understanding and agree with treatment plan.   Labs reassuring On recheck, he is resting comfortably His abdomen is soft and no focal tenderness, no signs of obstruction clinically He has had no further pain complaints I don't feel acute abdominal imaging is necessary Also, he has no focal weakness and is able to stand on his own here, do not feel further testing is required   Procedures   MDM  Nursing notes including past medical history and social history reviewed and considered in documentation Labs/vital reviewed and considered     Date: 02/18/2013  Rate: 71  Rhythm: normal sinus rhythm  QRS Axis: normal  Intervals: normal  ST/T Wave abnormalities: nonspecific ST changes  Conduction Disutrbances:none  Narrative Interpretation:   Old EKG Reviewed: unchanged from prior    I personally performed the services described in this documentation, which was scribed in my presence. The recorded information has been reviewed and is accurate.         Joya Gaskins, MD 02/18/13 [phone]

## 2013-02-24 DIAGNOSIS — E871 Hypo-osmolality and hyponatremia: Secondary | ICD-10-CM | POA: Diagnosis not present

## 2013-02-24 DIAGNOSIS — B370 Candidal stomatitis: Secondary | ICD-10-CM | POA: Diagnosis not present

## 2013-02-24 DIAGNOSIS — K219 Gastro-esophageal reflux disease without esophagitis: Secondary | ICD-10-CM | POA: Diagnosis not present

## 2013-03-31 DIAGNOSIS — R110 Nausea: Secondary | ICD-10-CM | POA: Diagnosis not present

## 2013-03-31 DIAGNOSIS — R609 Edema, unspecified: Secondary | ICD-10-CM | POA: Diagnosis not present

## 2013-03-31 DIAGNOSIS — Z23 Encounter for immunization: Secondary | ICD-10-CM | POA: Diagnosis not present

## 2013-05-05 ENCOUNTER — Other Ambulatory Visit: Payer: Self-pay

## 2013-05-20 DIAGNOSIS — Z79899 Other long term (current) drug therapy: Secondary | ICD-10-CM | POA: Diagnosis not present

## 2013-05-20 DIAGNOSIS — R110 Nausea: Secondary | ICD-10-CM | POA: Diagnosis not present

## 2013-05-20 DIAGNOSIS — E871 Hypo-osmolality and hyponatremia: Secondary | ICD-10-CM | POA: Diagnosis not present

## 2013-05-20 DIAGNOSIS — F0390 Unspecified dementia without behavioral disturbance: Secondary | ICD-10-CM | POA: Diagnosis not present

## 2013-06-07 DIAGNOSIS — R079 Chest pain, unspecified: Secondary | ICD-10-CM | POA: Diagnosis not present

## 2013-07-13 ENCOUNTER — Ambulatory Visit: Payer: Medicare Other | Admitting: Internal Medicine

## 2013-08-04 DIAGNOSIS — F02818 Dementia in other diseases classified elsewhere, unspecified severity, with other behavioral disturbance: Secondary | ICD-10-CM | POA: Diagnosis not present

## 2013-08-04 DIAGNOSIS — E871 Hypo-osmolality and hyponatremia: Secondary | ICD-10-CM | POA: Diagnosis not present

## 2013-08-04 DIAGNOSIS — F0281 Dementia in other diseases classified elsewhere with behavioral disturbance: Secondary | ICD-10-CM | POA: Diagnosis not present

## 2013-11-28 DIAGNOSIS — F0280 Dementia in other diseases classified elsewhere without behavioral disturbance: Secondary | ICD-10-CM | POA: Diagnosis not present

## 2013-11-28 DIAGNOSIS — G309 Alzheimer's disease, unspecified: Secondary | ICD-10-CM | POA: Diagnosis not present

## 2013-11-28 DIAGNOSIS — R1310 Dysphagia, unspecified: Secondary | ICD-10-CM | POA: Diagnosis not present

## 2014-02-13 DIAGNOSIS — E039 Hypothyroidism, unspecified: Secondary | ICD-10-CM | POA: Diagnosis not present

## 2014-02-13 DIAGNOSIS — E871 Hypo-osmolality and hyponatremia: Secondary | ICD-10-CM | POA: Diagnosis not present

## 2014-02-13 DIAGNOSIS — G309 Alzheimer's disease, unspecified: Secondary | ICD-10-CM | POA: Diagnosis not present

## 2014-02-13 DIAGNOSIS — Z79899 Other long term (current) drug therapy: Secondary | ICD-10-CM | POA: Diagnosis not present

## 2014-02-13 DIAGNOSIS — F0280 Dementia in other diseases classified elsewhere without behavioral disturbance: Secondary | ICD-10-CM | POA: Diagnosis not present

## 2014-08-24 DIAGNOSIS — G309 Alzheimer's disease, unspecified: Secondary | ICD-10-CM | POA: Diagnosis not present

## 2014-08-24 DIAGNOSIS — E871 Hypo-osmolality and hyponatremia: Secondary | ICD-10-CM | POA: Diagnosis not present

## 2014-12-19 DIAGNOSIS — G309 Alzheimer's disease, unspecified: Secondary | ICD-10-CM | POA: Diagnosis not present

## 2014-12-19 DIAGNOSIS — R600 Localized edema: Secondary | ICD-10-CM | POA: Diagnosis not present

## 2014-12-19 DIAGNOSIS — E871 Hypo-osmolality and hyponatremia: Secondary | ICD-10-CM | POA: Diagnosis not present

## 2015-02-06 ENCOUNTER — Ambulatory Visit (HOSPITAL_COMMUNITY)
Admission: RE | Admit: 2015-02-06 | Discharge: 2015-02-06 | Disposition: A | Discharge status: Home / Self Care | Payer: Medicare Other | Source: Ambulatory Visit | Attending: Internal Medicine | Admitting: Internal Medicine

## 2015-02-06 ENCOUNTER — Other Ambulatory Visit (HOSPITAL_COMMUNITY): Payer: Self-pay | Admitting: Internal Medicine

## 2015-02-06 DIAGNOSIS — G309 Alzheimer's disease, unspecified: Secondary | ICD-10-CM | POA: Diagnosis not present

## 2015-02-06 DIAGNOSIS — J449 Chronic obstructive pulmonary disease, unspecified: Secondary | ICD-10-CM | POA: Diagnosis not present

## 2015-02-06 DIAGNOSIS — E871 Hypo-osmolality and hyponatremia: Secondary | ICD-10-CM | POA: Diagnosis not present

## 2015-02-06 DIAGNOSIS — R079 Chest pain, unspecified: Secondary | ICD-10-CM

## 2015-02-06 DIAGNOSIS — Z6827 Body mass index (BMI) 27.0-27.9, adult: Secondary | ICD-10-CM | POA: Diagnosis not present

## 2015-02-14 ENCOUNTER — Emergency Department (HOSPITAL_COMMUNITY): Payer: Medicare Other

## 2015-02-14 ENCOUNTER — Encounter (HOSPITAL_COMMUNITY): Payer: Self-pay

## 2015-02-14 ENCOUNTER — Emergency Department (HOSPITAL_COMMUNITY)
Admission: EM | Admit: 2015-02-14 | Discharge: 2015-02-14 | Disposition: A | Discharge status: Home / Self Care | Payer: Medicare Other | Attending: Emergency Medicine | Admitting: Emergency Medicine

## 2015-02-14 DIAGNOSIS — Z8739 Personal history of other diseases of the musculoskeletal system and connective tissue: Secondary | ICD-10-CM | POA: Diagnosis not present

## 2015-02-14 DIAGNOSIS — K219 Gastro-esophageal reflux disease without esophagitis: Secondary | ICD-10-CM | POA: Diagnosis not present

## 2015-02-14 DIAGNOSIS — E079 Disorder of thyroid, unspecified: Secondary | ICD-10-CM | POA: Insufficient documentation

## 2015-02-14 DIAGNOSIS — F0280 Dementia in other diseases classified elsewhere without behavioral disturbance: Secondary | ICD-10-CM | POA: Insufficient documentation

## 2015-02-14 DIAGNOSIS — G8929 Other chronic pain: Secondary | ICD-10-CM | POA: Diagnosis not present

## 2015-02-14 DIAGNOSIS — G309 Alzheimer's disease, unspecified: Secondary | ICD-10-CM | POA: Insufficient documentation

## 2015-02-14 DIAGNOSIS — H9190 Unspecified hearing loss, unspecified ear: Secondary | ICD-10-CM | POA: Diagnosis not present

## 2015-02-14 DIAGNOSIS — Z8679 Personal history of other diseases of the circulatory system: Secondary | ICD-10-CM | POA: Insufficient documentation

## 2015-02-14 DIAGNOSIS — K5900 Constipation, unspecified: Secondary | ICD-10-CM | POA: Diagnosis not present

## 2015-02-14 DIAGNOSIS — Z862 Personal history of diseases of the blood and blood-forming organs and certain disorders involving the immune mechanism: Secondary | ICD-10-CM | POA: Diagnosis not present

## 2015-02-14 HISTORY — DX: Alzheimer's disease, unspecified: G30.9

## 2015-02-14 HISTORY — DX: Disorder of thyroid, unspecified: E07.9

## 2015-02-14 HISTORY — DX: Dementia in other diseases classified elsewhere, unspecified severity, without behavioral disturbance, psychotic disturbance, mood disturbance, and anxiety: F02.80

## 2015-02-14 LAB — BASIC METABOLIC PANEL
Anion gap: 6 (ref 5–15)
BUN: 11 mg/dL (ref 6–20)
CALCIUM: 8.6 mg/dL — AB (ref 8.9–10.3)
CO2: 28 mmol/L (ref 22–32)
Chloride: 104 mmol/L (ref 101–111)
Creatinine, Ser: 0.85 mg/dL (ref 0.61–1.24)
GFR calc Af Amer: >60 mL/min (ref >60)
GLUCOSE: 123 mg/dL — AB (ref 65–99)
Potassium: 3.6 mmol/L (ref 3.5–5.1)
SODIUM: 138 mmol/L (ref 135–145)

## 2015-02-14 LAB — CBC WITH DIFFERENTIAL/PLATELET
BASOS ABS: 0 10*3/uL (ref 0.0–0.1)
Basophils Relative: 0 % (ref 0–1)
EOS ABS: 0.2 10*3/uL (ref 0.0–0.7)
EOS PCT: 3 % (ref 0–5)
HCT: 34.8 % — ABNORMAL LOW (ref 39.0–52.0)
Hemoglobin: 11.9 g/dL — ABNORMAL LOW (ref 13.0–17.0)
LYMPHS PCT: 17 % (ref 12–46)
Lymphs Abs: 1 10*3/uL (ref 0.7–4.0)
MCH: 32.3 pg (ref 26.0–34.0)
MCHC: 34.2 g/dL (ref 30.0–36.0)
MCV: 94.6 fL (ref 78.0–100.0)
MONO ABS: 0.7 10*3/uL (ref 0.1–1.0)
Monocytes Relative: 12 % (ref 3–12)
Neutro Abs: 3.7 10*3/uL (ref 1.7–7.7)
Neutrophils Relative %: 68 % (ref 43–77)
PLATELETS: 255 10*3/uL (ref 150–400)
RBC: 3.68 MIL/uL — AB (ref 4.22–5.81)
RDW: 14.3 % (ref 11.5–15.5)
WBC: 5.6 10*3/uL (ref 4.0–10.5)

## 2015-02-14 LAB — URINALYSIS, ROUTINE W REFLEX MICROSCOPIC
BILIRUBIN URINE: NEGATIVE
Glucose, UA: NEGATIVE mg/dL
HGB URINE DIPSTICK: NEGATIVE
NITRITE: NEGATIVE
Protein, ur: NEGATIVE mg/dL
UROBILINOGEN UA: 0.2 mg/dL (ref 0.0–1.0)
pH: 6 (ref 5.0–8.0)

## 2015-02-14 LAB — URINE MICROSCOPIC-ADD ON

## 2015-02-14 LAB — MAGNESIUM: MAGNESIUM: 2.4 mg/dL (ref 1.7–2.4)

## 2015-02-14 MED ORDER — FLEET ENEMA 7-19 GM/118ML RE ENEM: 1.0000 | ENEMA | Freq: Once | RECTAL | Status: DC

## 2015-02-14 MED ORDER — MINERAL OIL RE ENEM
1.0000 | ENEMA | Freq: Once | RECTAL | Status: AC
  Administered 2015-02-14: 1 via RECTAL

## 2015-02-14 MED ORDER — MILK AND MOLASSES ENEMA
1.0000 | Freq: Once | RECTAL | Status: DC
  Filled 2015-02-14: qty 250

## 2015-02-14 NOTE — Discharge Instructions (Signed)

## 2015-02-14 NOTE — ED Notes (Signed)
Bladder scanner showed of urine post void.

## 2015-02-14 NOTE — ED Notes (Signed)
resident of [facility] in [place]. Daughter states that staff called and stated he was constipated and was crying this morning. Daughter states inability to urinate today. Unable to determine when pt last urinated. Watery stool either last night or this morning.

## 2015-02-14 NOTE — ED Notes (Signed)
Pt is resident from [facility]. Son accompanies pt and he reports that pt has been c/o of abdominal pain. Pt reports that his stomach doesn't hurt right now but it usually hurts in the morning. Pt reports he usually has to "use his finger" when he tries to have a BM.

## 2015-02-14 NOTE — ED Provider Notes (Signed)
CSN: [id number]     Arrival date & time 02/14/15  1412 History   First MD Initiated Contact with Patient 02/14/15 1443     Chief Complaint  Patient presents with  . Constipation     (Consider location/radiation/quality/duration/timing/severity/associated sxs/prior Treatment) Patient is a 81 y.o. male presenting with constipation. The history is provided by the patient and a relative.  Constipation Severity:  Moderate Time since last bowel movement:  1 day Timing:  Constant Progression:  Unchanged Chronicity:  New Stool description:  Hard Unusual stool frequency:  1 a day Relieved by:  Nothing Worsened by:  Nothing tried Ineffective treatments:  None tried Associated symptoms: no abdominal pain, no diarrhea, no fever, no nausea and no vomiting    Level V caveat dementia  81 year old male with a chief complaint of constipation. Family states he's been waking up with crampy abdominal pain. Has to use his finger to finish his bowel movement. Nursing home was concerned about the amount of pain this morning and so are going to send him to the emergency department. Family decided that EMS was not warranted into the picked him up and driven here. Patient currently denying any abdominal pain. There's also some concern about urinary retention. Patient confused about the situation states that he has urinated recently without difficulty. Denies any current abdominal pain. Denies any vomiting.  Past Medical History  Diagnosis Date  . Hyponatremia   . Small bowel obstruction   . Hearing deficit   . Arthritis   . Chronic back pain   . 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  . Anemia   . Dizziness   . Diverticulitis of colon 11/14/2011  . GERD (gastroesophageal reflux disease)   . Hernia     hiatal and inguinal  . Chronic abdominal pain   . Chronic nausea   . Memory deficits   . Alzheimer's dementia   . Thyroid disease    Past Surgical History  Procedure Laterality Date    . Colonoscopy  04/08/2012    RMR: Pancolonic diverticulosis   Family History  Problem Relation Age of Onset  . Colon cancer Neg Hx   . Cancer Mother     stomach?   Social History  Substance Use Topics  . Smoking status: Former Smoker -- 0.30 packs/day    Types: Cigarettes  . Smokeless tobacco: Former Neurosurgeon    Quit date: 07/29/1958  . Alcohol Use: 0.6 oz/week    1 Cans of beer per week     Comment: 1 beer occasionally    Review of Systems  Constitutional: Negative for fever and chills.  HENT: Negative for congestion and facial swelling.   Eyes: Negative for discharge and visual disturbance.  Respiratory: Negative for shortness of breath.   Cardiovascular: Negative for chest pain and palpitations.  Gastrointestinal: Positive for constipation. Negative for nausea, vomiting, abdominal pain, diarrhea and anal bleeding.  Musculoskeletal: Negative for myalgias and arthralgias.  Skin: Negative for color change and rash.  Neurological: Negative for tremors, syncope and headaches.  Psychiatric/Behavioral: Negative for confusion and dysphoric mood.      Allergies  Aricept; Diazepam; and Lorazepam  Home Medications   Prior to Admission medications   Medication Sig Start Date End Date Taking? Authorizing Provider  famotidine (PEPCID) 20 MG tablet Take 20 mg by mouth at bedtime.   Yes Historical Provider, MD  levothyroxine (SYNTHROID, LEVOTHROID) 75 MCG tablet Take 75 mcg by mouth daily before breakfast.   Yes Historical Provider,  MD  omeprazole (PRILOSEC) 40 MG capsule Take 40 mg by mouth daily.   Yes Historical Provider, MD  ondansetron (ZOFRAN) 4 MG tablet Take 1 tablet (4 mg total) by mouth every 8 (eight) hours as needed for nausea. Patient taking differently: Take 4 mg by mouth 3 (three) times daily as needed for nausea.  12/17/12  Yes Tiffany Kocher, PA-C  polyethylene glycol (MIRALAX / GLYCOLAX) packet Take 17 g by mouth daily.    Yes Historical Provider, MD  psyllium  (HYDROCIL/METAMUCIL) 95 % PACK Take 1 packet by mouth every other day.   Yes Historical Provider, MD  traMADol (ULTRAM) 50 MG tablet Take 50 mg by mouth 2 (two) times daily as needed for pain.    Yes Historical Provider, MD   BP 126/68 mmHg  Pulse 68  Temp(Src) 97.7 F (36.5 C) (Oral)  Resp 16  Ht 5\' 9"  (1.753 m)  Wt 155 lb (70.308 kg)  BMI 22.88 kg/m2  SpO2 100% Physical Exam  Constitutional: He is oriented to person, place, and time. He appears well-developed and well-nourished.  HENT:  Head: Normocephalic and atraumatic.  Eyes: EOM are normal. Pupils are equal, round, and reactive to light.  Neck: Normal range of motion. Neck supple. No JVD present.  Cardiovascular: Normal rate and regular rhythm.  Exam reveals no gallop and no friction rub.   No murmur heard. Pulmonary/Chest: No respiratory distress. He has no wheezes.  Abdominal: He exhibits no distension. There is no rebound and no guarding.    Genitourinary: Prostate normal. Rectal exam shows no external hemorrhoid, no internal hemorrhoid and no fissure. Prostate is not enlarged and not tender.  No noted stool in the vault  Musculoskeletal: Normal range of motion.  Neurological: He is alert and oriented to person, place, and time.  Skin: No rash noted. No pallor.  Psychiatric: He has a normal mood and affect. His behavior is normal.    ED Course  Procedures (including critical care time) Labs Review Labs Reviewed  URINALYSIS, ROUTINE W REFLEX MICROSCOPIC (NOT AT [facility]) - Abnormal; Notable for the following:    Specific Gravity, Urine <1.005 (*)    Ketones, ur TRACE (*)    Leukocytes, UA TRACE (*)    All other components within normal limits  CBC WITH DIFFERENTIAL/PLATELET - Abnormal; Notable for the following:    RBC 3.68 (*)    Hemoglobin 11.9 (*)    HCT 34.8 (*)    All other components within normal limits  BASIC METABOLIC PANEL - Abnormal; Notable for the following:    Glucose, Bld 123 (*)    Calcium 8.6 (*)      All other components within normal limits  URINE MICROSCOPIC-ADD ON - Abnormal; Notable for the following:    Squamous Epithelial / LPF FEW (*)    Bacteria, UA FEW (*)    All other components within normal limits  MAGNESIUM    Imaging Review Dg Abd 1 View  02/14/2015   CLINICAL DATA:  Constipation, back pain.  EXAM: ABDOMEN - 1 VIEW  COMPARISON:  01/04/2013  FINDINGS: Nonobstructive bowel gas pattern. Moderate stool burden in the colon. No organomegaly, free air or suspicious calcification. Severe leftward scoliosis and degenerative changes in the lumbar spine.  IMPRESSION: No acute findings.   Electronically Signed   By: Charlett Nose M.D.   On: 02/14/2015 16:15   I have personally reviewed and evaluated these images and lab results as part of my medical decision-making.   EKG Interpretation  None      MDM   Final diagnoses:  Constipation, unspecified constipation type    81 yo M with a chief complaint of possible constipation. No noted stool in the vault. With possible history of urinary retention will obtain a bladder scan mouth the patient to try and urinate. Give the patient an enema. Patient has a history of a small bowel obstruction though patient currently not having any symptoms. No vomiting. Feel that small bowel obstruction would be unlikely.  Patient able have a large bowel movement in the ED without intervention. Given a fleets enema as well. KUB with no signs bowel obstruction though with moderate amount of stool. Able to urinate without difficulty after bowel movement. Will follow-up with his PCP.   I have discussed the diagnosis/risks/treatment options with the patient and believe the pt to be eligible for discharge home to follow-up with PCP. We also discussed returning to the ED immediately if new or worsening sx occur. We discussed the sx which are most concerning (e.g., vomiting, sudden worsening pain) that necessitate immediate return. Medications administered to  the patient during their visit and any new prescriptions provided to the patient are listed below.  Medications given during this visit Medications  mineral oil enema 1 enema (1 enema Rectal Given 02/14/15 1711)    Discharge Medication List as of 02/14/2015  5:15 PM       The patient appears reasonably screen and/or stabilized for discharge and I doubt any other medical condition or other [facility] requiring further screening, evaluation, or treatment in the ED at this time prior to discharge.    Melene Plan, DO 02/14/15 2144

## 2015-03-02 DIAGNOSIS — F0280 Dementia in other diseases classified elsewhere without behavioral disturbance: Secondary | ICD-10-CM | POA: Diagnosis not present

## 2015-03-02 DIAGNOSIS — F0390 Unspecified dementia without behavioral disturbance: Secondary | ICD-10-CM | POA: Diagnosis not present

## 2015-03-02 DIAGNOSIS — Z0000 Encounter for general adult medical examination without abnormal findings: Secondary | ICD-10-CM | POA: Diagnosis not present

## 2015-03-02 DIAGNOSIS — R4182 Altered mental status, unspecified: Secondary | ICD-10-CM | POA: Diagnosis not present

## 2015-04-17 DIAGNOSIS — B351 Tinea unguium: Secondary | ICD-10-CM | POA: Diagnosis not present

## 2015-04-17 DIAGNOSIS — M79675 Pain in left toe(s): Secondary | ICD-10-CM | POA: Diagnosis not present

## 2015-04-17 DIAGNOSIS — M79674 Pain in right toe(s): Secondary | ICD-10-CM | POA: Diagnosis not present

## 2015-04-17 DIAGNOSIS — L602 Onychogryphosis: Secondary | ICD-10-CM | POA: Diagnosis not present

## 2015-04-20 DIAGNOSIS — Z23 Encounter for immunization: Secondary | ICD-10-CM | POA: Diagnosis not present

## 2015-04-20 DIAGNOSIS — G309 Alzheimer's disease, unspecified: Secondary | ICD-10-CM | POA: Diagnosis not present

## 2015-04-20 DIAGNOSIS — E039 Hypothyroidism, unspecified: Secondary | ICD-10-CM | POA: Diagnosis not present

## 2015-04-20 DIAGNOSIS — Z6831 Body mass index (BMI) 31.0-31.9, adult: Secondary | ICD-10-CM | POA: Diagnosis not present

## 2015-05-09 ENCOUNTER — Emergency Department (HOSPITAL_COMMUNITY)
Admission: EM | Admit: 2015-05-09 | Discharge: 2015-05-09 | Disposition: A | Discharge status: Home / Self Care | Payer: Medicare Other | Attending: Emergency Medicine | Admitting: Emergency Medicine

## 2015-05-09 ENCOUNTER — Emergency Department (HOSPITAL_COMMUNITY): Payer: Medicare Other

## 2015-05-09 ENCOUNTER — Encounter (HOSPITAL_COMMUNITY): Payer: Self-pay

## 2015-05-09 DIAGNOSIS — Z862 Personal history of diseases of the blood and blood-forming organs and certain disorders involving the immune mechanism: Secondary | ICD-10-CM | POA: Insufficient documentation

## 2015-05-09 DIAGNOSIS — G8929 Other chronic pain: Secondary | ICD-10-CM | POA: Insufficient documentation

## 2015-05-09 DIAGNOSIS — Z8739 Personal history of other diseases of the musculoskeletal system and connective tissue: Secondary | ICD-10-CM | POA: Diagnosis not present

## 2015-05-09 DIAGNOSIS — R05 Cough: Secondary | ICD-10-CM | POA: Diagnosis not present

## 2015-05-09 DIAGNOSIS — Z79899 Other long term (current) drug therapy: Secondary | ICD-10-CM | POA: Insufficient documentation

## 2015-05-09 DIAGNOSIS — H9190 Unspecified hearing loss, unspecified ear: Secondary | ICD-10-CM | POA: Insufficient documentation

## 2015-05-09 DIAGNOSIS — E079 Disorder of thyroid, unspecified: Secondary | ICD-10-CM | POA: Insufficient documentation

## 2015-05-09 DIAGNOSIS — K219 Gastro-esophageal reflux disease without esophagitis: Secondary | ICD-10-CM | POA: Insufficient documentation

## 2015-05-09 DIAGNOSIS — F0280 Dementia in other diseases classified elsewhere without behavioral disturbance: Secondary | ICD-10-CM | POA: Diagnosis not present

## 2015-05-09 DIAGNOSIS — G309 Alzheimer's disease, unspecified: Secondary | ICD-10-CM | POA: Insufficient documentation

## 2015-05-09 DIAGNOSIS — R059 Cough, unspecified: Secondary | ICD-10-CM

## 2015-05-09 NOTE — Discharge Instructions (Signed)

## 2015-05-09 NOTE — ED Notes (Signed)
Pt's daughter reports [place]  Reports cough and congestion for past few days.  Unknown if has had fever.

## 2015-05-09 NOTE — ED Provider Notes (Signed)
CSN: [id number]     Arrival date & time 05/09/15  1332 History   First MD Initiated Contact with Patient 05/09/15 1343     Chief Complaint  Patient presents with  . Cough    LEVEL 5 CAVEAT  - DEMENTIA  Patient is a 81 y.o. male presenting with cough. The history is provided by the patient and a relative.  Cough Severity:  Moderate Onset quality:  Gradual Duration:  1 day Timing:  Intermittent Progression:  Worsening Chronicity:  New Relieved by:  Nothing Worsened by:  Nothing tried Associated symptoms: no fever and no shortness of breath   Pt presents from assisted living facility for cough Per daughter, he called her last night and said he "was dying" and had increased cough Staff at ALF confirm he has been coughing more frequently No fever No SOB reported He is otherwise at baseline per family.  No new confusion reported  Past Medical History  Diagnosis Date  . Hyponatremia   . Small bowel obstruction (HCC)   . Hearing deficit   . Arthritis   . Chronic back pain   . 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  . Anemia   . Dizziness   . Diverticulitis of colon 11/14/2011  . GERD (gastroesophageal reflux disease)   . Hernia     hiatal and inguinal  . Chronic abdominal pain   . Chronic nausea   . Memory deficits   . Alzheimer's dementia   . Thyroid disease    Past Surgical History  Procedure Laterality Date  . Colonoscopy  04/08/2012    RMR: Pancolonic diverticulosis   Family History  Problem Relation Age of Onset  . Colon cancer Neg Hx   . Cancer Mother     stomach?   Social History  Substance Use Topics  . Smoking status: Former Smoker -- 0.30 packs/day    Types: Cigarettes  . Smokeless tobacco: Former NeurosurgeonUser    Quit date: 07/29/1958  . Alcohol Use: No     Comment: 1 beer occasionally    Review of Systems  Unable to perform ROS: Dementia  Constitutional: Negative for fever.  Respiratory: Positive for cough. Negative for shortness of  breath.       Allergies  Aricept; Diazepam; and Lorazepam  Home Medications   Prior to Admission medications   Medication Sig Start Date End Date Taking? Authorizing Provider  famotidine (PEPCID) 20 MG tablet Take 20 mg by mouth at bedtime.    Historical Provider, MD  levothyroxine (SYNTHROID, LEVOTHROID) 75 MCG tablet Take 75 mcg by mouth daily before breakfast.    Historical Provider, MD  omeprazole (PRILOSEC) 40 MG capsule Take 40 mg by mouth daily.    Historical Provider, MD  ondansetron (ZOFRAN) 4 MG tablet Take 1 tablet (4 mg total) by mouth every 8 (eight) hours as needed for nausea. Patient taking differently: Take 4 mg by mouth 3 (three) times daily as needed for nausea.  12/17/12   Tiffany KocherLeslie S Lewis, PA-C  polyethylene glycol (MIRALAX / GLYCOLAX) packet Take 17 g by mouth daily.     Historical Provider, MD  psyllium (HYDROCIL/METAMUCIL) 95 % PACK Take 1 packet by mouth every other day.    Historical Provider, MD  traMADol (ULTRAM) 50 MG tablet Take 50 mg by mouth 2 (two) times daily as needed for pain.     Historical Provider, MD   BP 105/66 mmHg  Pulse 86  Temp(Src) 98.3 F (36.8 C) (Oral)  Resp 13  Ht  (1.727 m)  Wt 155 lb (70.308 kg)  BMI 23.57 kg/m2  SpO2 98% Physical Exam CONSTITUTIONAL: elderly, no acute distress HEAD: Normocephalic/atraumatic EYES: EOMI ENMT: Mucous membranes moist NECK: supple no meningeal signs CV: S1/S2 noted, no murmurs/rubs/gallops noted LUNGS: Lungs are clear to auscultation bilaterally, no apparent distress ABDOMEN: soft, nontender GU:no cva tenderness NEURO: Pt is awake/alert/appropriate, moves all extremitiesx4.  No facial droop.  Pt is pleasantly confused EXTREMITIES: pulses normal/equal, full ROM SKIN: warm, color normal PSYCH: no abnormalities of mood noted, alert and oriented to situation  ED Course  Procedures  Imaging Review Dg Chest 2 View  05/09/2015  CLINICAL DATA:  Non productive cough and slight congestion since  yesterday. EXAM: CHEST  2 VIEW COMPARISON:  02/06/2015 FINDINGS: Lateral view degraded by patient arm position. Lower thoracic and upper lumbar spondylosis. Midline trachea. Borderline cardiomegaly. Atherosclerosis in the transverse aorta. Mediastinal contours otherwise within normal limits. No pleural effusion or pneumothorax. Mild left base scarring. IMPRESSION: No acute cardiopulmonary disease. Electronically Signed   By: Jeronimo Greaves M.D.   On: 05/09/2015 15:01   I have personally reviewed and evaluated these images results as part of my medical decision-making.   Pt well appearing CXR negative Stable for d/c home No fever No distress noted Discussed return precautions with daughter  MDM   Final diagnoses:  Cough    Nursing notes including past medical history and social history reviewed and considered in documentation xrays/imaging reviewed by myself and considered during evaluation     Larry Rhine, MD 05/09/15 1513

## 2015-06-12 DIAGNOSIS — F0150 Vascular dementia without behavioral disturbance: Secondary | ICD-10-CM | POA: Diagnosis not present

## 2015-06-18 DIAGNOSIS — L03116 Cellulitis of left lower limb: Secondary | ICD-10-CM | POA: Diagnosis not present

## 2015-07-18 ENCOUNTER — Emergency Department (HOSPITAL_COMMUNITY): Payer: Medicare Other

## 2015-07-18 ENCOUNTER — Emergency Department (HOSPITAL_COMMUNITY)
Admission: EM | Admit: 2015-07-18 | Discharge: 2015-07-18 | Disposition: A | Discharge status: Home / Self Care | Payer: Medicare Other | Attending: Emergency Medicine | Admitting: Emergency Medicine

## 2015-07-18 ENCOUNTER — Encounter (HOSPITAL_COMMUNITY): Payer: Self-pay | Admitting: Emergency Medicine

## 2015-07-18 DIAGNOSIS — Z8679 Personal history of other diseases of the circulatory system: Secondary | ICD-10-CM | POA: Insufficient documentation

## 2015-07-18 DIAGNOSIS — E079 Disorder of thyroid, unspecified: Secondary | ICD-10-CM | POA: Insufficient documentation

## 2015-07-18 DIAGNOSIS — Z862 Personal history of diseases of the blood and blood-forming organs and certain disorders involving the immune mechanism: Secondary | ICD-10-CM | POA: Insufficient documentation

## 2015-07-18 DIAGNOSIS — J069 Acute upper respiratory infection, unspecified: Secondary | ICD-10-CM | POA: Diagnosis not present

## 2015-07-18 DIAGNOSIS — R05 Cough: Secondary | ICD-10-CM | POA: Diagnosis not present

## 2015-07-18 DIAGNOSIS — F0280 Dementia in other diseases classified elsewhere without behavioral disturbance: Secondary | ICD-10-CM | POA: Diagnosis not present

## 2015-07-18 DIAGNOSIS — K219 Gastro-esophageal reflux disease without esophagitis: Secondary | ICD-10-CM | POA: Insufficient documentation

## 2015-07-18 DIAGNOSIS — Z79899 Other long term (current) drug therapy: Secondary | ICD-10-CM | POA: Diagnosis not present

## 2015-07-18 DIAGNOSIS — G8929 Other chronic pain: Secondary | ICD-10-CM | POA: Insufficient documentation

## 2015-07-18 DIAGNOSIS — G309 Alzheimer's disease, unspecified: Secondary | ICD-10-CM | POA: Diagnosis not present

## 2015-07-18 DIAGNOSIS — L309 Dermatitis, unspecified: Secondary | ICD-10-CM

## 2015-07-18 DIAGNOSIS — Z87891 Personal history of nicotine dependence: Secondary | ICD-10-CM | POA: Diagnosis not present

## 2015-07-18 MED ORDER — LORATADINE 10 MG PO TABS
10.0000 mg | ORAL_TABLET | Freq: Once | ORAL | Status: AC
  Administered 2015-07-18: 10 mg via ORAL
  Filled 2015-07-18: qty 1

## 2015-07-18 MED ORDER — PREDNISONE 20 MG PO TABS
20.0000 mg | ORAL_TABLET | Freq: Every day | ORAL | Status: DC
  Administered 2015-07-18: 20 mg via ORAL
  Filled 2015-07-18: qty 1

## 2015-07-18 MED ORDER — PREDNISONE 20 MG PO TABS: 20.0000 mg | ORAL_TABLET | Freq: Every day | ORAL | Status: DC

## 2015-07-18 MED ORDER — TRIAMCINOLONE ACETONIDE 0.1 % EX CREA: 1.0000 "application " | TOPICAL_CREAM | Freq: Two times a day (BID) | CUTANEOUS | Status: DC

## 2015-07-18 NOTE — Discharge Instructions (Signed)
Prescription for prednisone and steroid ointment. Recommend 1 Claritin daily. Also recommend Mucinex as needed.  Chest x-ray shows no pneumonia.

## 2015-07-18 NOTE — ED Provider Notes (Signed)
CSN: [id number]     Arrival date & time 07/18/15  1202 History  By signing my name below, I, Elon Spanner, attest that this documentation has been prepared under the direction and in the presence of Donnetta Hutching, MD. Electronically Signed: Elon Spanner, ED Scribe. 07/18/2015. 12:17 PM.    Chief Complaint  Patient presents with  . Cough  . Rash   The history is provided by the patient and a relative. The history is limited by the condition of the patient. No language interpreter was used.  LEVEL 5 CAVEAT (DEMENTIA) HPI Comments: MOREY ANDONIAN is a 82 y.o. male who presents to the Emergency Department complaining of an itching rash on his bilateral arms and back onset 2 days ago.  The rash has been tx'd with hydrocortisone which caused some burning but no noticeable improvement.  He denies dermatologic exposure but he daughter reports he moved to a new part of his living facility last week.  He also complains of a productive cough onset 3 days ago.  Per daughter, patient was rx'd doxycycline 12/31 for BLE cellulitis, which caused a generalized rash that resolved with discontinuation. Patient lives in [facility] in [place].    PCP: Carylon Perches, MD   Past Medical History  Diagnosis Date  . Hyponatremia   . Small bowel obstruction (HCC)   . Hearing deficit   . Arthritis   . Chronic back pain   . 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  . Anemia   . Dizziness   . Diverticulitis of colon 11/14/2011  . GERD (gastroesophageal reflux disease)   . Hernia     hiatal and inguinal  . Chronic abdominal pain   . Chronic nausea   . Memory deficits   . Alzheimer's dementia   . Thyroid disease    Past Surgical History  Procedure Laterality Date  . Colonoscopy  04/08/2012    RMR: Pancolonic diverticulosis   Family History  Problem Relation Age of Onset  . Colon cancer Neg Hx   . Cancer Mother     stomach?   Social History  Substance Use Topics  . Smoking  status: Former Smoker -- 0.30 packs/day    Types: Cigarettes  . Smokeless tobacco: Former Neurosurgeon    Quit date: 07/29/1958  . Alcohol Use: No     Comment: 1 beer occasionally    Review of Systems  Unable to perform ROS: Dementia   A complete 10 system review of systems was obtained and all systems are negative except as noted in the HPI and PMH.   Allergies  Aricept; Diazepam; Doxycycline; and Lorazepam  Home Medications   Prior to Admission medications   Medication Sig Start Date End Date Taking? Authorizing Provider  acetaminophen (TYLENOL) 500 MG tablet Take 500 mg by mouth every 6 (six) hours as needed for mild pain or moderate pain.   Yes Historical Provider, MD  benzonatate (TESSALON) 200 MG capsule Take 200 mg by mouth 3 (three) times daily.   Yes Historical Provider, MD  diphenhydrAMINE (BENADRYL) 25 MG tablet Take 25 mg by mouth every 6 (six) hours as needed for itching.   Yes Historical Provider, MD  famotidine (PEPCID) 20 MG tablet Take 20 mg by mouth at bedtime.   Yes Historical Provider, MD  levothyroxine (SYNTHROID, LEVOTHROID) 75 MCG tablet Take 75 mcg by mouth daily before breakfast.   Yes Historical Provider, MD  omeprazole (PRILOSEC) 20 MG capsule Take 20 mg by mouth daily.  Yes Historical Provider, MD  ondansetron (ZOFRAN) 4 MG tablet Take 1 tablet (4 mg total) by mouth every 8 (eight) hours as needed for nausea. 12/17/12  Yes Tiffany Kocher, PA-C  polyethylene glycol (MIRALAX / GLYCOLAX) packet Take 17 g by mouth daily as needed for mild constipation or moderate constipation.    Yes Historical Provider, MD  psyllium (HYDROCIL/METAMUCIL) 95 % PACK Take 1 packet by mouth daily as needed for mild constipation or moderate constipation.    Yes Historical Provider, MD  predniSONE (DELTASONE) 20 MG tablet Take 1 tablet (20 mg total) by mouth daily with breakfast. 07/18/15   Donnetta Hutching, MD  triamcinolone cream (KENALOG) 0.1 % Apply 1 application topically 2 (two) times daily.  07/18/15   Donnetta Hutching, MD   BP 130/71 mmHg  Pulse 82  Temp(Src) 97.9 F (36.6 C) (Oral)  Resp 18  Ht  (1.676 m)  Wt 165 lb (74.844 kg)  BMI 26.64 kg/m2  SpO2 100% Physical Exam  Constitutional: He is oriented to person, place, and time. He appears well-developed and well-nourished. No distress.  Coughing  HENT:  Head: Normocephalic and atraumatic.  Eyes: Conjunctivae and EOM are normal.  Neck: Neck supple. No tracheal deviation present.  Cardiovascular: Normal rate.   Pulmonary/Chest: Effort normal. No respiratory distress.  Musculoskeletal: Normal range of motion.  Neurological: He is alert and oriented to person, place, and time.  Skin: Skin is warm and dry.  Upper back and BUE's from the elbow down he has an erythematous maculopapular excoriation.   Psychiatric: He has a normal mood and affect. His behavior is normal.  Nursing note and vitals reviewed.   ED Course  Procedures (including critical care time)  DIAGNOSTIC STUDIES: Oxygen Saturation is 100% on RA, normal by my interpretation.    COORDINATION OF CARE:  12:25 PM Discussed suspicion of dermatitis.  Will prescribe steroid ointment and prednisone.  Patient should use OTC Claritin.  Discussed suspicion of viral etiology for cough. Patient and daughter agree with plan.    Labs Review Labs Reviewed - No data to display  Imaging Review Dg Chest 2 View  07/18/2015  CLINICAL DATA:  Three-day history of cough EXAM: CHEST  2 VIEW COMPARISON:  May 09, 2015 FINDINGS: There is scarring in the left base region. There is no edema or consolidation. Heart size is upper normal with pulmonary vascularity within normal limits. No adenopathy. There is lower thoracic dextroscoliosis. There is degenerative change in the thoracic spine. There is a focal area of calcification in the left carotid artery. IMPRESSION: Scarring left base. No edema or consolidation. Focal area of calcification in the left carotid artery.  Electronically Signed   By: Bretta Bang III M.D.   On: 07/18/2015 13:25   I have personally reviewed and evaluated these images and lab results as part of my medical decision-making.   EKG Interpretation None      MDM   Final diagnoses:  URI (upper respiratory infection)  Dermatitis    Patient's hemodynamic be stable. Chest x-ray negative. Rx prednisone and Kenalog 0.1% ointment for rash. Discussed findings with the daughter.  I personally performed the services described in this documentation, which was scribed in my presence. The recorded information has been reviewed and is accurate.       Donnetta Hutching, MD 07/18/15 [phone]

## 2015-07-18 NOTE — ED Notes (Signed)
Patient with c/o cough x 3 days, non-productive, Nursing home called Dr Ouida Sills and received Benzonatate Rx TID that he has not started yet. Rash to upper back, bilateral arms that started Sunday, +itching. Patient with cellulitis in December and started on Doxycycline to which he possibly had a reaction to with rash and they d/c'd due to symptoms. Daughter tried hydrocortisone cream on arms.

## 2015-08-16 DIAGNOSIS — E039 Hypothyroidism, unspecified: Secondary | ICD-10-CM | POA: Diagnosis not present

## 2015-08-16 DIAGNOSIS — F0391 Unspecified dementia with behavioral disturbance: Secondary | ICD-10-CM | POA: Diagnosis not present

## 2015-10-05 ENCOUNTER — Emergency Department: Payer: Medicare Other

## 2015-10-05 ENCOUNTER — Encounter: Payer: Self-pay | Admitting: *Deleted

## 2015-10-05 ENCOUNTER — Inpatient Hospital Stay
Admission: EM | Admit: 2015-10-05 | Discharge: 2015-10-07 | DRG: 392 | Payer: Medicare Other | Attending: Internal Medicine | Admitting: Internal Medicine

## 2015-10-05 DIAGNOSIS — K5792 Diverticulitis of intestine, part unspecified, without perforation or abscess without bleeding: Secondary | ICD-10-CM | POA: Diagnosis not present

## 2015-10-05 DIAGNOSIS — G300 Alzheimer's disease with early onset: Secondary | ICD-10-CM | POA: Diagnosis not present

## 2015-10-05 DIAGNOSIS — Z888 Allergy status to other drugs, medicaments and biological substances status: Secondary | ICD-10-CM

## 2015-10-05 DIAGNOSIS — K219 Gastro-esophageal reflux disease without esophagitis: Secondary | ICD-10-CM | POA: Diagnosis present

## 2015-10-05 DIAGNOSIS — F0280 Dementia in other diseases classified elsewhere without behavioral disturbance: Secondary | ICD-10-CM | POA: Diagnosis present

## 2015-10-05 DIAGNOSIS — M549 Dorsalgia, unspecified: Secondary | ICD-10-CM | POA: Diagnosis present

## 2015-10-05 DIAGNOSIS — Z66 Do not resuscitate: Secondary | ICD-10-CM | POA: Diagnosis present

## 2015-10-05 DIAGNOSIS — K5732 Diverticulitis of large intestine without perforation or abscess without bleeding: Principal | ICD-10-CM | POA: Diagnosis present

## 2015-10-05 DIAGNOSIS — Z809 Family history of malignant neoplasm, unspecified: Secondary | ICD-10-CM

## 2015-10-05 DIAGNOSIS — R1032 Left lower quadrant pain: Secondary | ICD-10-CM | POA: Diagnosis not present

## 2015-10-05 DIAGNOSIS — E871 Hypo-osmolality and hyponatremia: Secondary | ICD-10-CM | POA: Diagnosis present

## 2015-10-05 DIAGNOSIS — E860 Dehydration: Secondary | ICD-10-CM | POA: Diagnosis present

## 2015-10-05 DIAGNOSIS — Z79899 Other long term (current) drug therapy: Secondary | ICD-10-CM | POA: Diagnosis not present

## 2015-10-05 DIAGNOSIS — G309 Alzheimer's disease, unspecified: Secondary | ICD-10-CM | POA: Diagnosis present

## 2015-10-05 DIAGNOSIS — G8929 Other chronic pain: Secondary | ICD-10-CM | POA: Diagnosis present

## 2015-10-05 DIAGNOSIS — Z87891 Personal history of nicotine dependence: Secondary | ICD-10-CM

## 2015-10-05 LAB — COMPREHENSIVE METABOLIC PANEL
ALBUMIN: 3.9 g/dL (ref 3.5–5.0)
ALK PHOS: 60 U/L (ref 38–126)
ALT: 15 U/L — ABNORMAL LOW (ref 17–63)
ANION GAP: 6 (ref 5–15)
AST: 34 U/L (ref 15–41)
BILIRUBIN TOTAL: 1.5 mg/dL — AB (ref 0.3–1.2)
BUN: 12 mg/dL (ref 6–20)
CALCIUM: 8.5 mg/dL — AB (ref 8.9–10.3)
CO2: 27 mmol/L (ref 22–32)
Chloride: 97 mmol/L — ABNORMAL LOW (ref 101–111)
Creatinine, Ser: 0.75 mg/dL (ref 0.61–1.24)
GFR calc non Af Amer: >60 mL/min (ref >60)
Glucose, Bld: 98 mg/dL (ref 65–99)
POTASSIUM: 4.9 mmol/L (ref 3.5–5.1)
SODIUM: 130 mmol/L — AB (ref 135–145)
TOTAL PROTEIN: 7.2 g/dL (ref 6.5–8.1)

## 2015-10-05 LAB — CBC WITH DIFFERENTIAL/PLATELET
BASOS PCT: 1 %
Basophils Absolute: 0.1 10*3/uL (ref 0–0.1)
EOS ABS: 0.1 10*3/uL (ref 0–0.7)
Eosinophils Relative: 1 %
HCT: 36.5 % — ABNORMAL LOW (ref 40.0–52.0)
HEMOGLOBIN: 12.6 g/dL — AB (ref 13.0–18.0)
LYMPHS ABS: 0.8 10*3/uL — AB (ref 1.0–3.6)
Lymphocytes Relative: 10 %
MCH: 33 pg (ref 26.0–34.0)
MCHC: 34.5 g/dL (ref 32.0–36.0)
MCV: 95.6 fL (ref 80.0–100.0)
MONO ABS: 0.8 10*3/uL (ref 0.2–1.0)
MONOS PCT: 10 %
NEUTROS PCT: 78 %
Neutro Abs: 6 10*3/uL (ref 1.4–6.5)
Platelets: 266 10*3/uL (ref 150–440)
RBC: 3.82 MIL/uL — ABNORMAL LOW (ref 4.40–5.90)
RDW: 14.9 % — AB (ref 11.5–14.5)
WBC: 7.7 10*3/uL (ref 3.8–10.6)

## 2015-10-05 LAB — URINALYSIS COMPLETE WITH MICROSCOPIC (ARMC ONLY)
Bacteria, UA: NONE SEEN
Bilirubin Urine: NEGATIVE
GLUCOSE, UA: NEGATIVE mg/dL
Hgb urine dipstick: NEGATIVE
Ketones, ur: NEGATIVE mg/dL
Leukocytes, UA: NEGATIVE
Nitrite: NEGATIVE
PROTEIN: NEGATIVE mg/dL
SPECIFIC GRAVITY, URINE: 1.01 (ref 1.005–1.030)
pH: 7 (ref 5.0–8.0)

## 2015-10-05 LAB — LIPASE, BLOOD: Lipase: 13 U/L (ref 11–51)

## 2015-10-05 MED ORDER — CIPROFLOXACIN IN D5W 400 MG/200ML IV SOLN
400.0000 mg | Freq: Two times a day (BID) | INTRAVENOUS | Status: DC
  Administered 2015-10-05 – 2015-10-06 (×2): 400 mg via INTRAVENOUS
  Filled 2015-10-05 (×4): qty 200

## 2015-10-05 MED ORDER — DIATRIZOATE MEGLUMINE & SODIUM 66-10 % PO SOLN
15.0000 mL | Freq: Once | ORAL | Status: AC
  Administered 2015-10-05: 15 mL via ORAL

## 2015-10-05 MED ORDER — ACETAMINOPHEN 650 MG RE SUPP: 650.0000 mg | Freq: Four times a day (QID) | RECTAL | Status: DC | PRN

## 2015-10-05 MED ORDER — ALBUTEROL SULFATE (2.5 MG/3ML) 0.083% IN NEBU: 2.5000 mg | INHALATION_SOLUTION | RESPIRATORY_TRACT | Status: DC | PRN

## 2015-10-05 MED ORDER — IOPAMIDOL (ISOVUE-300) INJECTION 61%
100.0000 mL | Freq: Once | INTRAVENOUS | Status: AC | PRN
  Administered 2015-10-05: 100 mL via INTRAVENOUS

## 2015-10-05 MED ORDER — DIPHENHYDRAMINE HCL 25 MG PO CAPS
25.0000 mg | ORAL_CAPSULE | Freq: Four times a day (QID) | ORAL | Status: DC | PRN
  Administered 2015-10-05: 25 mg via ORAL
  Filled 2015-10-05 (×3): qty 1

## 2015-10-05 MED ORDER — MORPHINE SULFATE (PF) 2 MG/ML IV SOLN
2.0000 mg | Freq: Once | INTRAVENOUS | Status: AC
  Administered 2015-10-05: 2 mg via INTRAVENOUS
  Filled 2015-10-05: qty 1

## 2015-10-05 MED ORDER — ONDANSETRON HCL 4 MG PO TABS: 4.0000 mg | ORAL_TABLET | Freq: Four times a day (QID) | ORAL | Status: DC | PRN

## 2015-10-05 MED ORDER — ACETAMINOPHEN 325 MG PO TABS
650.0000 mg | ORAL_TABLET | ORAL | Status: AC
  Administered 2015-10-05: 650 mg via ORAL
  Filled 2015-10-05: qty 2

## 2015-10-05 MED ORDER — SODIUM CHLORIDE 0.9 % IV BOLUS (SEPSIS)
500.0000 mL | Freq: Once | INTRAVENOUS | Status: AC
  Administered 2015-10-05: 500 mL via INTRAVENOUS

## 2015-10-05 MED ORDER — METRONIDAZOLE IN NACL 5-0.79 MG/ML-% IV SOLN
500.0000 mg | Freq: Three times a day (TID) | INTRAVENOUS | Status: DC
  Administered 2015-10-05 – 2015-10-06 (×4): 500 mg via INTRAVENOUS
  Filled 2015-10-05 (×5): qty 100

## 2015-10-05 MED ORDER — MORPHINE SULFATE (PF) 2 MG/ML IV SOLN
2.0000 mg | INTRAVENOUS | Status: DC | PRN
  Administered 2015-10-05 – 2015-10-06 (×2): 2 mg via INTRAVENOUS
  Filled 2015-10-05 (×2): qty 1

## 2015-10-05 MED ORDER — ENOXAPARIN SODIUM 40 MG/0.4ML ~~LOC~~ SOLN
40.0000 mg | SUBCUTANEOUS | Status: DC
  Administered 2015-10-06 – 2015-10-07 (×2): 40 mg via SUBCUTANEOUS
  Filled 2015-10-05 (×2): qty 0.4

## 2015-10-05 MED ORDER — SODIUM CHLORIDE 0.9 % IV SOLN
INTRAVENOUS | Status: AC
  Administered 2015-10-05: 16:00:00 via INTRAVENOUS

## 2015-10-05 MED ORDER — PANTOPRAZOLE SODIUM 40 MG PO TBEC
40.0000 mg | DELAYED_RELEASE_TABLET | Freq: Every day | ORAL | Status: DC
  Administered 2015-10-05 – 2015-10-07 (×3): 40 mg via ORAL
  Filled 2015-10-05 (×4): qty 1

## 2015-10-05 MED ORDER — HYDROCODONE-ACETAMINOPHEN 5-325 MG PO TABS
1.0000 | ORAL_TABLET | ORAL | Status: DC | PRN
  Administered 2015-10-05: 2 via ORAL
  Administered 2015-10-05 – 2015-10-06 (×2): 1 via ORAL
  Administered 2015-10-06: 2 via ORAL
  Administered 2015-10-06 – 2015-10-07 (×2): 1 via ORAL
  Filled 2015-10-05 (×4): qty 1
  Filled 2015-10-05 (×2): qty 2

## 2015-10-05 MED ORDER — HALOPERIDOL LACTATE 5 MG/ML IJ SOLN: 1.0000 mg | Freq: Four times a day (QID) | INTRAMUSCULAR | Status: DC | PRN

## 2015-10-05 MED ORDER — ONDANSETRON HCL 4 MG/2ML IJ SOLN
4.0000 mg | Freq: Four times a day (QID) | INTRAMUSCULAR | Status: DC | PRN
Start: 2015-10-05 — End: 2015-10-07

## 2015-10-05 MED ORDER — LACTATED RINGERS IV SOLN: INTRAVENOUS | Status: DC

## 2015-10-05 MED ORDER — HALOPERIDOL 1 MG PO TABS
1.0000 mg | ORAL_TABLET | Freq: Three times a day (TID) | ORAL | Status: DC | PRN
  Administered 2015-10-05 – 2015-10-06 (×3): 1 mg via ORAL
  Filled 2015-10-05 (×5): qty 1

## 2015-10-05 MED ORDER — PIPERACILLIN-TAZOBACTAM 3.375 G IVPB
3.3750 g | Freq: Once | INTRAVENOUS | Status: AC
  Administered 2015-10-05: 3.375 g via INTRAVENOUS
  Filled 2015-10-05: qty 50

## 2015-10-05 MED ORDER — ACETAMINOPHEN 325 MG PO TABS
650.0000 mg | ORAL_TABLET | Freq: Four times a day (QID) | ORAL | Status: DC | PRN
  Filled 2015-10-05: qty 2

## 2015-10-05 MED ORDER — LEVOTHYROXINE SODIUM 75 MCG PO TABS
75.0000 ug | ORAL_TABLET | Freq: Every day | ORAL | Status: DC
  Administered 2015-10-06 – 2015-10-07 (×2): 75 ug via ORAL
  Filled 2015-10-05 (×2): qty 1

## 2015-10-05 NOTE — H&P (Signed)
[facility] [place] [facility]   PATIENT NAME: Larry Cox    MR#:  [id number]  DATE OF BIRTH:  March 01, 1922  DATE OF ADMISSION:  10/05/2015  PRIMARY CARE PHYSICIAN: Carylon Perches, MD   REQUESTING/REFERRING PHYSICIAN: Dr. Fanny Bien  CHIEF COMPLAINT:   Chief Complaint  Patient presents with  . Abdominal Pain    HISTORY OF PRESENT ILLNESS:  Larry Cox  is a 82 y.o. male with a known history of dementia, SBO here with lower abd pain of 1 day. Started yesterday and progressively worsened. No vomiting/diarrhea. Afebrile. Poor historian due to dementia. History obtained from daughter at bedside and ALF notes. CT abd showed acute diverticulitis Pain uncontrolled in spite of 2 IV morphine doses in ED.  PAST MEDICAL HISTORY:   Past Medical History  Diagnosis Date  . Hyponatremia   . Small bowel obstruction (HCC)   . Hearing deficit   . Arthritis   . Chronic back pain   . 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  . Anemia   . Dizziness   . Diverticulitis of colon 11/14/2011  . GERD (gastroesophageal reflux disease)   . Hernia     hiatal and inguinal  . Chronic abdominal pain   . Chronic nausea   . Memory deficits   . Alzheimer's dementia   . Thyroid disease     PAST SURGICAL HISTORY:   Past Surgical History  Procedure Laterality Date  . Colonoscopy  04/08/2012    RMR: Pancolonic diverticulosis    SOCIAL HISTORY:   Social History  Substance Use Topics  . Smoking status: Former Smoker -- 0.30 packs/day    Types: Cigarettes  . Smokeless tobacco: Former Neurosurgeon    Quit date: 07/29/1958  . Alcohol Use: No     Comment: 1 beer occasionally    FAMILY HISTORY:   Family History  Problem Relation Age of Onset  . Colon cancer Neg Hx   . Cancer Mother     stomach?    DRUG ALLERGIES:   Allergies  Allergen Reactions  . Aricept [Donepezil Hcl] Other (See Comments)    Confusion   . Diazepam     Hallucinations  . Doxycycline    . Lorazepam Other (See Comments)    Hallucinations, Violent Behavior    REVIEW OF SYSTEMS:   Review of Systems  Unable to perform ROS: dementia   Keeps repeating abdominal pain MEDICATIONS AT HOME:   Prior to Admission medications   Medication Sig Start Date End Date Taking? Authorizing Provider  acetaminophen (TYLENOL) 500 MG tablet Take 500 mg by mouth every 6 (six) hours as needed for mild pain or moderate pain.   Yes Historical Provider, MD  diphenhydrAMINE (BENADRYL) 25 MG tablet Take 25 mg by mouth every 6 (six) hours as needed for itching.   Yes Historical Provider, MD  famotidine (PEPCID) 20 MG tablet Take 20 mg by mouth at bedtime.   Yes Historical Provider, MD  levothyroxine (SYNTHROID, LEVOTHROID) 75 MCG tablet Take 75 mcg by mouth daily before breakfast.   Yes Historical Provider, MD  omeprazole (PRILOSEC) 20 MG capsule Take 20 mg by mouth daily.   Yes Historical Provider, MD  ondansetron (ZOFRAN) 4 MG tablet Take 1 tablet (4 mg total) by mouth every 8 (eight) hours as needed for nausea. 12/17/12  Yes Tiffany Kocher, PA-C  polyethylene glycol (MIRALAX / GLYCOLAX) packet Take 17 g by mouth daily as needed for mild constipation or moderate constipation.  Yes Historical Provider, MD  Psyllium (REGULOID) 28.3 % POWD Take 1 scoop by mouth as needed.   Yes Historical Provider, MD  triamcinolone cream (KENALOG) 0.1 % Apply 1 application topically 2 (two) times daily as needed.   Yes Historical Provider, MD     VITAL SIGNS:  Blood pressure 116/83, pulse 85, temperature 97.7 F (36.5 C), temperature source Oral, resp. rate 14, height  (1.676 m), weight 74.844 kg (165 lb), SpO2 76 %.  PHYSICAL EXAMINATION:  Physical Exam  GENERAL:  82 y.o.-year-old patient lying in the bed with distress due to pain EYES: Pupils equal, round, reactive to light and accommodation. No scleral icterus. Extraocular muscles intact.  HEENT: Head atraumatic, normocephalic. Oropharynx and  nasopharynx clear. No oropharyngeal erythema, moist oral mucosa  NECK:  Supple, no jugular venous distention. No thyroid enlargement, no tenderness.  LUNGS: Normal breath sounds bilaterally, no wheezing, rales, rhonchi. No use of accessory muscles of respiration.  CARDIOVASCULAR: S1, S2 normal. No murmurs, rubs, or gallops.  ABDOMEN: Soft, nondistended. Bowel sounds present. No organomegaly or mass. Tenderness lower abdomen EXTREMITIES: No pedal edema, cyanosis, or clubbing. + 2 pedal & radial pulses b/l.   NEUROLOGIC: Cranial nerves II through XII are intact. No focal Motor or sensory deficits appreciated b/l PSYCHIATRIC: The patient is alert and awake. Confused. restless SKIN: No obvious rash, lesion, or ulcer.   LABORATORY PANEL:   CBC  Recent Labs Lab 10/05/15 1127  WBC 7.7  HGB 12.6*  HCT 36.5*  PLT 266   ------------------------------------------------------------------------------------------------------------------  Chemistries   Recent Labs Lab 10/05/15 1127  NA 130*  K 4.9  CL 97*  CO2 27  GLUCOSE 98  BUN 12  CREATININE 0.75  CALCIUM 8.5*  AST 34  ALT 15*  ALKPHOS 60  BILITOT 1.5*   ------------------------------------------------------------------------------------------------------------------  Cardiac Enzymes No results for input(s): TROPONINI in the last 168 hours. ------------------------------------------------------------------------------------------------------------------  RADIOLOGY:  Ct Abdomen Pelvis W Contrast  10/05/2015  CLINICAL DATA:  82 year old male with lower abdominal pain since this morning. Left lower quadrant pain. Initial encounter. EXAM: CT ABDOMEN AND PELVIS WITH CONTRAST TECHNIQUE: Multidetector CT imaging of the abdomen and pelvis was performed using the standard protocol following bolus administration of intravenous contrast. CONTRAST:  ISOVUE-300 IOPAMIDOL (ISOVUE-300) INJECTION 61% COMPARISON:  CT Abdomen and Pelvis   11/09/2012 FINDINGS: Stable lung bases, left lower lobe and lingula chronic atelectasis or scarring. No pericardial or pleural effusion. Scoliosis and widespread advanced disc degeneration in the spine. No acute osseous abnormality identified. Chronic fat containing left inguinal hernia appears stable. No pelvic free fluid. Unremarkable urinary bladder. Severe diverticulosis of the sigmoid colon with acute inflammation in the mid to distal sigmoid, greater on the right (series 5, image 55). A segment of about 6 cm is affected with mesenteric stranding. No extraluminal gas identified. No associated fluid collection. Severe diverticulosis continues to the splenic flexure. No additional active inflammation. Motion artifact through the upper abdomen. The transverse colon appears negative. Negative right colon, appendix (series 5, image 57), and terminal ileum. No dilated small bowel. Incidental duodenum all diverticulum. Negative stomach. No abdominal free air or free fluid. Negative liver, gallbladder, spleen, pancreas and adrenal glands. Portal venous system is patent. Aortoiliac calcified atherosclerosis noted. Major arterial structures are patent. Both kidneys appear stable. Renal enhancement and contrast excretion within normal limits. No lymphadenopathy. IMPRESSION: 1. Severe diverticulosis of the descending and sigmoid colon segments with acute diverticulitis in the mid to distal sigmoid. No evidence of perforation or complicating  features. 2. Chronic fat containing left inguinal hernia. Calcified aortic atherosclerosis. Electronically Signed   By: Odessa FlemingH  Hall M.D.   On: 10/05/2015 13:20     IMPRESSION AND PLAN:   * Acute diverticluitis NPO IV abx IVF - NS 1600ml/hr Diet from tomorrow if pain improved  * Hypovolemic hyponatremia due to dehydration IVF with NS  * Dementia Watch for inpatient delirium  * DVT prophylaxis Lovenox  All the records are reviewed and case discussed with ED  provider. Management plans discussed with the patient, family and they are in agreement.  CODE STATUS: DNR Dicussed with HCPOA daughter at bedside  TOTAL TIME TAKING CARE OF THIS PATIENT: 40 minutes.   Milagros LollSudini, Shahrukh Pasch R M.D on 10/05/2015 at 1:56 PM  Between 7am to 6pm - Pager - [phone]  After 6pm go to [email or web address] - password [email or web address]  [facility]  Office  [phone]  CC: Primary care physician; Carylon PerchesFAGAN,ROY, MD  Note: This dictation was prepared with Dragon dictation along with smaller phrase technology. Any transcriptional errors that result from this process are unintentional.

## 2015-10-05 NOTE — ED Notes (Signed)
Family at bedside. 

## 2015-10-05 NOTE — ED Provider Notes (Signed)
[facility] Emergency Department Provider Note  ____________________________________________  Time seen: Approximately 11:36 AM  I have reviewed the triage vital signs and the nursing notes.   HISTORY  Chief Complaint Abdominal Pain    HPI Larry Cox is a 82 y.o. male presents for evaluation of lower left abdominal pain. Patient states he began having pain in his left lower stomach today. He reports a fairly severe pain whenever he pushes in on the left side of his belly.  He denies any fever or chills. No chest pain or trouble breathing. No diarrhea and states he's been eating normally with normal bowel movements. Denies any bloody bowel movements or fever.  Describes an achy pain worse with palpation over the left lower abdomen. No pain in his groin or testicles.   Past Medical History  Diagnosis Date  . Hyponatremia   . Small bowel obstruction (HCC)   . Hearing deficit   . Arthritis   . Chronic back pain   . 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  . Anemia   . Dizziness   . Diverticulitis of colon 11/14/2011  . GERD (gastroesophageal reflux disease)   . Hernia     hiatal and inguinal  . Chronic abdominal pain   . Chronic nausea   . Memory deficits   . Alzheimer's dementia   . Thyroid disease     Patient Active Problem List   Diagnosis Date Noted  . Dementia 12/11/2012  . Atypical chest pain 12/11/2012  . Abdominal pain, chronic, left lower quadrant 12/11/2012  . Bilateral inguinal hernia 12/11/2012  . Acute encephalopathy 11/09/2012  . Unspecified hypothyroidism 11/09/2012  . Aphasia 11/09/2012  . Muscle cramps 11/09/2012  . Nausea with vomiting 11/09/2012  . Hypokalemia 04/29/2012  . Chronic abdominal pain 03/31/2012  . GERD (gastroesophageal reflux disease) 03/31/2012  . 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 Elevated BP 11/15/2011  . Sundowning 11/15/2011  . Metabolic acidosis 11/14/2011  .  Diverticulitis of colon 11/14/2011  . Anemia 11/14/2011  . Dizziness 11/14/2011  . SBO (small bowel obstruction) (HCC) 05/09/2011  . Hyponatremia 05/09/2011    Past Surgical History  Procedure Laterality Date  . Colonoscopy  04/08/2012    RMR: Pancolonic diverticulosis    Current Outpatient Rx  Name  Route  Sig  Dispense  Refill  . acetaminophen (TYLENOL) 500 MG tablet   Oral   Take 500 mg by mouth every 6 (six) hours as needed for mild pain or moderate pain.         . diphenhydrAMINE (BENADRYL) 25 MG tablet   Oral   Take 25 mg by mouth every 6 (six) hours as needed for itching.         . famotidine (PEPCID) 20 MG tablet   Oral   Take 20 mg by mouth at bedtime.         Marland Kitchen levothyroxine (SYNTHROID, LEVOTHROID) 75 MCG tablet   Oral   Take 75 mcg by mouth daily before breakfast.         . omeprazole (PRILOSEC) 20 MG capsule   Oral   Take 20 mg by mouth daily.         . ondansetron (ZOFRAN) 4 MG tablet   Oral   Take 1 tablet (4 mg total) by mouth every 8 (eight) hours as needed for nausea.   20 tablet   0   . polyethylene glycol (MIRALAX / GLYCOLAX) packet   Oral  Take 17 g by mouth daily as needed for mild constipation or moderate constipation.          . Psyllium (REGULOID) 28.3 % POWD   Oral   Take 1 scoop by mouth as needed.         . triamcinolone cream (KENALOG) 0.1 %   Topical   Apply 1 application topically 2 (two) times daily as needed.           Allergies Aricept; Diazepam; Doxycycline; and Lorazepam  Family History  Problem Relation Age of Onset  . Colon cancer Neg Hx   . Cancer Mother     stomach?    Social History Social History  Substance Use Topics  . Smoking status: Former Smoker -- 0.30 packs/day    Types: Cigarettes  . Smokeless tobacco: Former Neurosurgeon    Quit date: 07/29/1958  . Alcohol Use: No     Comment: 1 beer occasionally    Review of Systems Constitutional: No fever/chills Eyes: No visual changes. ENT: No  sore throat. Cardiovascular: Denies chest pain. Respiratory: Denies shortness of breath. Gastrointestinal: See history of present illness No nausea, no vomiting.  No diarrhea.  No constipation. Genitourinary: Negative for dysuria. Musculoskeletal: Negative for back pain. Skin: Negative for rash. Neurological: Negative for headaches or numbness.  10-point ROS otherwise negative.  ____________________________________________   PHYSICAL EXAM:  VITAL SIGNS: ED Triage Vitals  Enc Vitals Group     BP 10/05/15 1123 126/80 mmHg     Pulse Rate 10/05/15 1123 86     Resp 10/05/15 1123 21     Temp 10/05/15 1123 97.7 F (36.5 C)     Temp Source 10/05/15 1123 Oral     SpO2 10/05/15 1123 100 %     Weight 10/05/15 1123 165 lb (74.844 kg)     Height 10/05/15 1123  (1.676 m)     Head Cir --      Peak Flow --      Pain Score --      Pain Loc --      Pain Edu? --      Excl. in GC? --    Constitutional: Alert and oriented to self and place but not to today's date. Well appearing and in no acute distress. Eyes: Conjunctivae are normal. PERRL. EOMI. Head: Atraumatic. Nose: No congestion/rhinnorhea. Mouth/Throat: Mucous membranes are moist.  Oropharynx non-erythematous. Neck: No stridor.   Cardiovascular: Normal rate, regular rhythm. Grossly normal heart sounds.  Good peripheral circulation. Respiratory: Normal respiratory effort.  No retractions. Lungs CTAB. Gastrointestinal: Soft and nontender except for notable tenderness with some rebound pain primarily along the left lower quadrant. No distention.   Groin exam demonstrates no mass or hernia or other audible abnormality. Scrotum/testicles normal.  Musculoskeletal: No lower extremity tenderness. Trace lower extremity edema bilateral. Neurologic:  Normal speech and language. No gross focal neurologic deficits are appreciated. No gait instability. Skin:  Skin is warm, dry and intact. No rash noted. Psychiatric: Mood and affect are  normal. Speech and behavior are normal.  ____________________________________________   LABS (all labs ordered are listed, but only abnormal results are displayed)  Labs Reviewed  CBC WITH DIFFERENTIAL/PLATELET - Abnormal; Notable for the following:    RBC 3.82 (*)    Hemoglobin 12.6 (*)    HCT 36.5 (*)    RDW 14.9 (*)    Lymphs Abs 0.8 (*)    All other components within normal limits  COMPREHENSIVE METABOLIC PANEL - Abnormal; Notable for  the following:    Sodium 130 (*)    Chloride 97 (*)    Calcium 8.5 (*)    ALT 15 (*)    Total Bilirubin 1.5 (*)    All other components within normal limits  URINALYSIS COMPLETEWITH MICROSCOPIC ([facility] ONLY) - Abnormal; Notable for the following:    Color, Urine YELLOW (*)    APPearance CLEAR (*)    Squamous Epithelial / LPF 0-5 (*)    All other components within normal limits  LIPASE, BLOOD   ____________________________________________  EKG  ED ECG REPORT I, Aimar Borghi, the attending physician, personally viewed and interpreted this ECG.  Date: 10/05/2015 EKG Time: 11:30 Rate: 80 Rhythm: normal sinus rhythm QRS Axis: normal Intervals: normal ST/T Wave abnormalities: normal Conduction Disturbances: none Narrative Interpretation: unremarkable  ____________________________________________  RADIOLOGY  CT Abdomen Pelvis W Contrast (Final result) Result time: 10/05/15 13:20:05   Final result by Rad Results In Interface (10/05/15 13:20:05)   Narrative:   CLINICAL DATA: 82 year old male with lower abdominal pain since this morning. Left lower quadrant pain. Initial encounter.  EXAM: CT ABDOMEN AND PELVIS WITH CONTRAST  TECHNIQUE: Multidetector CT imaging of the abdomen and pelvis was performed using the standard protocol following bolus administration of intravenous contrast.  CONTRAST: 100mL ISOVUE-300 IOPAMIDOL (ISOVUE-300) INJECTION 61%  COMPARISON: CT Abdomen and Pelvis 11/09/2012  FINDINGS: Stable lung  bases, left lower lobe and lingula chronic atelectasis or scarring. No pericardial or pleural effusion.  Scoliosis and widespread advanced disc degeneration in the spine. No acute osseous abnormality identified.  Chronic fat containing left inguinal hernia appears stable. No pelvic free fluid. Unremarkable urinary bladder.  Severe diverticulosis of the sigmoid colon with acute inflammation in the mid to distal sigmoid, greater on the right (series 5, image 55). A segment of about 6 cm is affected with mesenteric stranding. No extraluminal gas identified. No associated fluid collection.  Severe diverticulosis continues to the splenic flexure. No additional active inflammation. Motion artifact through the upper abdomen. The transverse colon appears negative. Negative right colon, appendix (series 5, image 57), and terminal ileum. No dilated small bowel. Incidental duodenum all diverticulum. Negative stomach.  No abdominal free air or free fluid. Negative liver, gallbladder, spleen, pancreas and adrenal glands. Portal venous system is patent. Aortoiliac calcified atherosclerosis noted. Major arterial structures are patent. Both kidneys appear stable. Renal enhancement and contrast excretion within normal limits. No lymphadenopathy.  IMPRESSION: 1. Severe diverticulosis of the descending and sigmoid colon segments with acute diverticulitis in the mid to distal sigmoid. No evidence of perforation or complicating features. 2. Chronic fat containing left inguinal hernia. Calcified aortic atherosclerosis.   Electronically Signed By: Odessa FlemingH Hall M.D. On: 10/05/2015 13:20    ____________________________________________   PROCEDURES  Procedure(s) performed: None  Critical Care performed: No  ____________________________________________   INITIAL IMPRESSION / ASSESSMENT AND PLAN / ED COURSE  Pertinent labs & imaging results that were available during my care of the patient  were reviewed by me and considered in my medical decision making (see chart for details).  Patient presents for left lower quadrant abdominal pain. He does have focal tenderness and some rebound discomfort, questionable peritonitis in the left lower quadrant. No other abnormalities. Denies any other symptoms. Differential diagnosis includes but is not limited to, abdominal perforation, aortic dissection, cholecystitis, appendicitis, diverticulitis, colitis, esophagitis/gastritis, kidney stone, pyelonephritis, urinary tract infection, aortic aneurysm. All are considered in decision and treatment plan. Based upon the patient's presentation and risk factors, we'll proceed with CT scan. Very  suspicious for possible infectious etiology such as diverticulitis, no associated cardiopulmonary symptoms. No evidence to support groin hernia or mass at this time. The patient overall appears nontoxic, but serving as a focal pain in this area. Given the patient's age CT imaging will be obtained to further evaluate for etiology and rule out perforation, vascular abnormality, abscess, etc.  ----------------------------------------- 1:39 PM on 10/05/2015 -----------------------------------------  Patient resting, in no distress at this time. CT consistent with acute diverticulitis, we'll treat and given his extreme of age, dementia, and assisted living status we will admit him for observation and initiation of treatment and ongoing pain control.  ____________________________________________   FINAL CLINICAL IMPRESSION(S) / ED DIAGNOSES  Final diagnoses:  Sigmoid diverticulitis  LLQ abdominal pain      Sharyn Creamer, MD 10/05/15 1340

## 2015-10-05 NOTE — ED Notes (Signed)
[facility] after reporting lower abd pain that beginning this morning. Pt reports a hx of diverticulitis. Pt has hx of dementia but is verbal and alert upon arrival. Pt denies changes in bowl movements or fever.

## 2015-10-06 LAB — BASIC METABOLIC PANEL
Anion gap: 7 (ref 5–15)
BUN: 8 mg/dL (ref 6–20)
CALCIUM: 7.6 mg/dL — AB (ref 8.9–10.3)
CO2: 23 mmol/L (ref 22–32)
CREATININE: 0.83 mg/dL (ref 0.61–1.24)
Chloride: 97 mmol/L — ABNORMAL LOW (ref 101–111)
GFR calc Af Amer: >60 mL/min (ref >60)
GFR calc non Af Amer: >60 mL/min (ref >60)
GLUCOSE: 88 mg/dL (ref 65–99)
POTASSIUM: 3.7 mmol/L (ref 3.5–5.1)
SODIUM: 127 mmol/L — AB (ref 135–145)

## 2015-10-06 LAB — CBC
HCT: 35.8 % — ABNORMAL LOW (ref 40.0–52.0)
Hemoglobin: 12 g/dL — ABNORMAL LOW (ref 13.0–18.0)
MCH: 32.5 pg (ref 26.0–34.0)
MCHC: 33.6 g/dL (ref 32.0–36.0)
MCV: 96.7 fL (ref 80.0–100.0)
PLATELETS: 241 10*3/uL (ref 150–440)
RBC: 3.7 MIL/uL — AB (ref 4.40–5.90)
RDW: 14.7 % — AB (ref 11.5–14.5)
WBC: 8 10*3/uL (ref 3.8–10.6)

## 2015-10-06 MED ORDER — POLYETHYLENE GLYCOL 3350 17 G PO PACK
17.0000 g | PACK | Freq: Every day | ORAL | Status: DC
  Administered 2015-10-06 – 2015-10-07 (×2): 17 g via ORAL
  Filled 2015-10-06 (×2): qty 1

## 2015-10-06 MED ORDER — METRONIDAZOLE 500 MG PO TABS
500.0000 mg | ORAL_TABLET | Freq: Three times a day (TID) | ORAL | Status: DC
  Administered 2015-10-06 – 2015-10-07 (×2): 500 mg via ORAL
  Filled 2015-10-06 (×2): qty 1

## 2015-10-06 MED ORDER — CIPROFLOXACIN HCL 500 MG PO TABS
500.0000 mg | ORAL_TABLET | Freq: Two times a day (BID) | ORAL | Status: DC
  Administered 2015-10-06 – 2015-10-07 (×2): 500 mg via ORAL
  Filled 2015-10-06 (×3): qty 1

## 2015-10-06 NOTE — Progress Notes (Signed)
[facility] [place] [facility] [place] [facility]   PATIENT NAME: Larry SilenceCharles Cox    MR#:  [id number]  DATE OF BIRTH:  05/04/1922  SUBJECTIVE:  CHIEF COMPLAINT:   Chief Complaint  Patient presents with  . Abdominal Pain   Has been complaining of on and off abdominal pain to nursing staff. Sitter at bedside. No vomiting. Nothing by mouth. Afebrile.  REVIEW OF SYSTEMS:    Review of Systems  Unable to perform ROS: dementia    DRUG ALLERGIES:   Allergies  Allergen Reactions  . Aricept [Donepezil Hcl] Other (See Comments)    Confusion   . Diazepam     Hallucinations  . Doxycycline   . Lorazepam Other (See Comments)    Hallucinations, Violent Behavior    VITALS:  Blood pressure 103/55, pulse 73, temperature 97.3 F (36.3 C), temperature source Oral, resp. rate 20, height 5\' 6"  (1.676 m), weight 73.664 kg (162 lb 6.4 oz), SpO2 91 %.  PHYSICAL EXAMINATION:   Physical Exam  GENERAL:  82 y.o.-year-old patient lying in the bed with no acute distress. Decreased hearing EYES: Pupils equal, round, reactive to light and accommodation. No scleral icterus. Extraocular muscles intact.  HEENT: Head atraumatic, normocephalic. Oropharynx and nasopharynx clear.  NECK:  Supple, no jugular venous distention. No thyroid enlargement, no tenderness.  LUNGS: Normal breath sounds bilaterally, no wheezing, rales, rhonchi. No use of accessory muscles of respiration.  CARDIOVASCULAR: S1, S2 normal. No murmurs, rubs, or gallops.  ABDOMEN: Soft, nondistended. Bowel sounds present. No organomegaly or mass. Tenderness lower abdomen EXTREMITIES: No cyanosis, clubbing or edema b/l.    NEUROLOGIC: Cranial nerves II through XII are intact. No focal Motor or sensory deficits b/l.   PSYCHIATRIC: The patient is alert and awake. Pleasantly confused. SKIN: No obvious rash, lesion, or ulcer.   LABORATORY PANEL:   CBC  Recent Labs Lab 10/06/15 0859  WBC 8.0  HGB 12.0*  HCT 35.8*  PLT  241   ------------------------------------------------------------------------------------------------------------------ Chemistries   Recent Labs Lab 10/05/15 1127 10/06/15 0859  NA 130* 127*  K 4.9 3.7  CL 97* 97*  CO2 27 23  GLUCOSE 98 88  BUN 12 8  CREATININE 0.75 0.83  CALCIUM 8.5* 7.6*  AST 34  --   ALT 15*  --   ALKPHOS 60  --   BILITOT 1.5*  --    ------------------------------------------------------------------------------------------------------------------  Cardiac Enzymes No results for input(s): TROPONINI in the last 168 hours. ------------------------------------------------------------------------------------------------------------------  RADIOLOGY:  Ct Abdomen Pelvis W Contrast  10/05/2015  CLINICAL DATA:  82 year old male with lower abdominal pain since this morning. Left lower quadrant pain. Initial encounter. EXAM: CT ABDOMEN AND PELVIS WITH CONTRAST TECHNIQUE: Multidetector CT imaging of the abdomen and pelvis was performed using the standard protocol following bolus administration of intravenous contrast. CONTRAST:  100mL ISOVUE-300 IOPAMIDOL (ISOVUE-300) INJECTION 61% COMPARISON:  CT Abdomen and Pelvis  11/09/2012 FINDINGS: Stable lung bases, left lower lobe and lingula chronic atelectasis or scarring. No pericardial or pleural effusion. Scoliosis and widespread advanced disc degeneration in the spine. No acute osseous abnormality identified. Chronic fat containing left inguinal hernia appears stable. No pelvic free fluid. Unremarkable urinary bladder. Severe diverticulosis of the sigmoid colon with acute inflammation in the mid to distal sigmoid, greater on the right (series 5, image 55). A segment of about 6 cm is affected with mesenteric stranding. No extraluminal gas identified. No associated fluid collection. Severe diverticulosis continues to the splenic flexure. No additional active inflammation. Motion artifact through the  upper abdomen. The transverse  colon appears negative. Negative right colon, appendix (series 5, image 57), and terminal ileum. No dilated small bowel. Incidental duodenum all diverticulum. Negative stomach. No abdominal free air or free fluid. Negative liver, gallbladder, spleen, pancreas and adrenal glands. Portal venous system is patent. Aortoiliac calcified atherosclerosis noted. Major arterial structures are patent. Both kidneys appear stable. Renal enhancement and contrast excretion within normal limits. No lymphadenopathy. IMPRESSION: 1. Severe diverticulosis of the descending and sigmoid colon segments with acute diverticulitis in the mid to distal sigmoid. No evidence of perforation or complicating features. 2. Chronic fat containing left inguinal hernia. Calcified aortic atherosclerosis. Electronically Signed   By: Odessa Fleming M.D.   On: 10/05/2015 13:20     ASSESSMENT AND PLAN:   * Acute diverticluitis Continues to have pain. Difficult to assess due to severe dementia. Mild tenderness on exam. Advance diet. IV abx Afebrile. Normal WBC.  * Hypovolemic hyponatremia due to dehydration Asymptomatic  * Dementia Watch for inpatient delirium Sitter at bedside  * DVT prophylaxis Lovenox  All the records are reviewed and case discussed with Care Management/Social Workerr. Management plans discussed with the patient, family and they are in agreement.  CODE STATUS: DNR  DVT Prophylaxis: SCDs  TOTAL TIME TAKING CARE OF THIS PATIENT: 30 minutes.   Possible discharge tomorrow back to his facility.  Milagros Loll R M.D on 10/06/2015 at 12:34 PM  Between 7am to 6pm - Pager - [phone]  After 6pm go to [email or web address] - password [email or web address]  [facility]  Office  [phone]  CC: Primary care physician; Carylon Perches, MD  Note: This dictation was prepared with Dragon dictation along with smaller phrase technology. Any transcriptional errors that result from this process are unintentional.

## 2015-10-06 NOTE — Progress Notes (Signed)
Patient discharged to home as ordered. Pneumococcal vaccine given prior to discharge. Discharge orders and follow up appointment given as ordered. Patient is alert and oriented ambulates with minimal assistance no acute distress noted.

## 2015-10-07 MED ORDER — HYDROCODONE-ACETAMINOPHEN 5-325 MG PO TABS: 1.0000 | ORAL_TABLET | Freq: Four times a day (QID) | ORAL | Status: DC | PRN

## 2015-10-07 MED ORDER — METRONIDAZOLE 500 MG PO TABS: 500.0000 mg | ORAL_TABLET | Freq: Three times a day (TID) | ORAL | Status: DC

## 2015-10-07 MED ORDER — CIPROFLOXACIN HCL 500 MG PO TABS: 500.0000 mg | ORAL_TABLET | Freq: Two times a day (BID) | ORAL | Status: DC

## 2015-10-07 NOTE — Discharge Instructions (Signed)
°  DIET:  °Regular diet ° °DISCHARGE CONDITION:  °Stable ° °ACTIVITY:  °Activity as tolerated ° °OXYGEN:  °Home Oxygen: No. °  °Oxygen Delivery: room air ° °DISCHARGE LOCATION:  °Assisted living  ° °If you experience worsening of your admission symptoms, develop shortness of breath, life threatening emergency, suicidal or homicidal thoughts you must seek medical attention immediately by calling 911 or calling your MD immediately  if symptoms less severe. ° °You Must read complete instructions/literature along with all the possible adverse reactions/side effects for all the Medicines you take and that have been prescribed to you. Take any new Medicines after you have completely understood and accpet all the possible adverse reactions/side effects.  ° °Please note ° °You were cared for by a hospitalist during your hospital stay. If you have any questions about your discharge medications or the care you received while you were in the hospital after you are discharged, you can call the unit and asked to speak with the hospitalist on call if the hospitalist that took care of you is not available. Once you are discharged, your primary care physician will handle any further medical issues. Please note that NO REFILLS for any discharge medications will be authorized once you are discharged, as it is imperative that you return to your primary care physician (or establish a relationship with a primary care physician if you do not have one) for your aftercare needs so that they can reassess your need for medications and monitor your lab values. ° ° ° °

## 2015-10-07 NOTE — Progress Notes (Signed)
[facility] will be [facility]. CSW faxed d/c orders to [phone] per staff request. Staff confirmed to receiving discharge orders. Vernona RiegerLaura from [facility] came to assess patient, and notified CSW via phone that patient was approved to return back to ALF. CSW notified patient daughter Antionette FairyMary Ann Xu via phone at [phone], that patient has orders for d/c. Daughter agreed to d/c. Daughter reports to CSW that she would transport patient back to [facility]. CSW notified nurse that patient can return to ALF and that packet was ready and placed next to patient chart, and daughter will transport patient in private vehicle. DNR placed in packet. No number given for report, family will need to bring packet to admission upon arrival, per staff request. Patient is alert but disoriented. No further identified needs.    Sherryl MangesSamantha Westin Knotts, BSW, MSW, LCSWA Clinical Social Work Dept [phone]

## 2015-10-07 NOTE — Progress Notes (Signed)
10/07/2015 14:45  Larry Cox to be D/C'd Skilled nursing facility per MD order. Report given to Larry Cox receiving staff.    Medication List    TAKE these medications        acetaminophen 500 MG tablet  Commonly known as:  TYLENOL  Take 500 mg by mouth every 6 (six) hours as needed for mild pain or moderate pain.     ciprofloxacin 500 MG tablet  Commonly known as:  CIPRO  Take 1 tablet (500 mg total) by mouth 2 (two) times daily.     diphenhydrAMINE 25 MG tablet  Commonly known as:  BENADRYL  Take 25 mg by mouth every 6 (six) hours as needed for itching.     famotidine 20 MG tablet  Commonly known as:  PEPCID  Take 20 mg by mouth at bedtime.     HYDROcodone-acetaminophen 5-325 MG tablet  Commonly known as:  NORCO/VICODIN  Take 1 tablet by mouth every 6 (six) hours as needed for moderate pain or severe pain.     levothyroxine 75 MCG tablet  Commonly known as:  SYNTHROID, LEVOTHROID  Take 75 mcg by mouth daily before breakfast.     metroNIDAZOLE 500 MG tablet  Commonly known as:  FLAGYL  Take 1 tablet (500 mg total) by mouth every 8 (eight) hours.     omeprazole 20 MG capsule  Commonly known as:  PRILOSEC  Take 20 mg by mouth daily.     ondansetron 4 MG tablet  Commonly known as:  ZOFRAN  Take 1 tablet (4 mg total) by mouth every 8 (eight) hours as needed for nausea.     polyethylene glycol packet  Commonly known as:  MIRALAX / GLYCOLAX  Take 17 g by mouth daily as needed for mild constipation or moderate constipation.     REGULOID 28.3 % Powd  Generic drug:  Psyllium  Take 1 scoop by mouth as needed.     triamcinolone cream 0.1 %  Commonly known as:  KENALOG  Apply 1 application topically 2 (two) times daily as needed.        Filed Vitals:   10/06/15 0519 10/06/15 1320  BP: 103/55 101/44  Pulse: 73 90  Temp: 97.3 F (36.3 C) 98.4 F (36.9 C)  Resp: 20 20    Skin clean, dry and intact without evidence of skin break down, no evidence of skin  tears noted. IV catheter discontinued intact. Site without signs and symptoms of complications. Dressing and pressure applied. Pt denies pain at this time. No complaints noted.  An After Visit Summary was printed and given to family to take to Larry Cox Patient escorted via Larry Cox, and D/C to SNF via private auto.  Bradly Chrisougherty, Odel Schmid E

## 2015-10-07 NOTE — Discharge Summary (Signed)
[facility] [place] [facility]   PATIENT NAME: Larry Cox    MR#:  [id number]  DATE OF BIRTH:  11/11/1921  DATE OF ADMISSION:  10/05/2015 ADMITTING PHYSICIAN: Milagros LollSrikar Jamorian Dimaria, MD  DATE OF DISCHARGE: No discharge date for patient encounter.  PRIMARY CARE PHYSICIAN: FAGAN,ROY, MD   ADMISSION DIAGNOSIS:  LLQ abdominal pain [R10.32] Sigmoid diverticulitis [K57.32]  DISCHARGE DIAGNOSIS:  Active Problems:   Acute diverticulitis   SECONDARY DIAGNOSIS:   Past Medical History  Diagnosis Date  . Hyponatremia   . Small bowel obstruction (HCC)   . Hearing deficit   . Arthritis   . Chronic back pain   . 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  . Anemia   . Dizziness   . Diverticulitis of colon 11/14/2011  . GERD (gastroesophageal reflux disease)   . Hernia     hiatal and inguinal  . Chronic abdominal pain   . Chronic nausea   . Memory deficits   . Alzheimer's dementia   . Thyroid disease      ADMITTING HISTORY  Larry Cox is a 82 y.o. male with a known history of dementia, SBO here with lower abd pain of 1 day. Started yesterday and progressively worsened. No vomiting/diarrhea. Afebrile. Poor historian due to dementia. History obtained from daughter at bedside and ALF notes. CT abd showed acute diverticulitis Pain uncontrolled in spite of 2 IV morphine doses in ED.  HOSPITAL COURSE:   * Acute diverticluitis Treated with IV abx and pain meds. Initially NPO and now tolerating diet. NO pain. Poor historian.  * Hypovolemic hyponatremia due to dehydration Asymptomatic  * Dementia Sitter at bedside in hsopital  * DVT prophylaxis Was on Lovenox  Stable for discharge back to ALF with PO abx for 1 week. Pain meds PRN  CONSULTS OBTAINED:     DRUG ALLERGIES:   Allergies  Allergen Reactions  . Aricept [Donepezil Hcl] Other (See Comments)    Confusion   . Diazepam     Hallucinations  . Doxycycline   . Lorazepam  Other (See Comments)    Hallucinations, Violent Behavior    DISCHARGE MEDICATIONS:   Current Discharge Medication List    START taking these medications   Details  ciprofloxacin (CIPRO) 500 MG tablet Take 1 tablet (500 mg total) by mouth 2 (two) times daily. Qty: 14 tablet, Refills: 0    HYDROcodone-acetaminophen (NORCO/VICODIN) 5-325 MG tablet Take 1 tablet by mouth every 6 (six) hours as needed for moderate pain or severe pain. Qty: 20 tablet, Refills: 0    metroNIDAZOLE (FLAGYL) 500 MG tablet Take 1 tablet (500 mg total) by mouth every 8 (eight) hours. Qty: 21 tablet, Refills: 0      CONTINUE these medications which have NOT CHANGED   Details  acetaminophen (TYLENOL) 500 MG tablet Take 500 mg by mouth every 6 (six) hours as needed for mild pain or moderate pain.    diphenhydrAMINE (BENADRYL) 25 MG tablet Take 25 mg by mouth every 6 (six) hours as needed for itching.    famotidine (PEPCID) 20 MG tablet Take 20 mg by mouth at bedtime.    levothyroxine (SYNTHROID, LEVOTHROID) 75 MCG tablet Take 75 mcg by mouth daily before breakfast.    omeprazole (PRILOSEC) 20 MG capsule Take 20 mg by mouth daily.    ondansetron (ZOFRAN) 4 MG tablet Take 1 tablet (4 mg total) by mouth every 8 (eight) hours as needed for nausea. Qty: 20 tablet, Refills: 0  polyethylene glycol (MIRALAX / GLYCOLAX) packet Take 17 g by mouth daily as needed for mild constipation or moderate constipation.     Psyllium (REGULOID) 28.3 % POWD Take 1 scoop by mouth as needed.    triamcinolone cream (KENALOG) 0.1 % Apply 1 application topically 2 (two) times daily as needed.        Today   VITAL SIGNS:  Blood pressure 101/44, pulse 90, temperature 98.4 F (36.9 C), temperature source Oral, resp. rate 20, height  (1.676 m), weight 73.392 kg (161 lb 12.8 oz), SpO2 98 %.  I/O:   Intake/Output Summary (Last 24 hours) at 10/07/15 1036 Last data filed at 10/07/15 0600  Gross per 24 hour  Intake    300  ml  Output      0 ml  Net    300 ml    PHYSICAL EXAMINATION:  Physical Exam  GENERAL:  82 y.o.-year-old patient lying in the bed with no acute distress. Decreased hearing LUNGS: Normal breath sounds bilaterally, no wheezing, rales,rhonchi or crepitation. No use of accessory muscles of respiration.  CARDIOVASCULAR: S1, S2 normal. No murmurs, rubs, or gallops.  ABDOMEN: Soft, non-tender, non-distended. Bowel sounds present. No organomegaly or mass.  NEUROLOGIC: Moves all 4 extremities. PSYCHIATRIC: The patient is alert and awake. SKIN: No obvious rash, lesion, or ulcer.   DATA REVIEW:   CBC  Recent Labs Lab 10/06/15 0859  WBC 8.0  HGB 12.0*  HCT 35.8*  PLT 241    Chemistries   Recent Labs Lab 10/05/15 1127 10/06/15 0859  NA 130* 127*  K 4.9 3.7  CL 97* 97*  CO2 27 23  GLUCOSE 98 88  BUN 12 8  CREATININE 0.75 0.83  CALCIUM 8.5* 7.6*  AST 34  --   ALT 15*  --   ALKPHOS 60  --   BILITOT 1.5*  --     Cardiac Enzymes No results for input(s): TROPONINI in the last 168 hours.  Microbiology Results  Results for orders placed or performed during the hospital encounter of 11/08/12  MRSA PCR Screening     Status: None   Collection Time: 11/09/12  3:31 AM  Result Value Ref Range Status   MRSA by PCR NEGATIVE NEGATIVE Final    Comment:        The GeneXpert MRSA Assay (FDA approved for NASAL specimens only), is one component of a comprehensive MRSA colonization surveillance program. It is not intended to diagnose MRSA infection nor to guide or monitor treatment for MRSA infections.    RADIOLOGY:  Ct Abdomen Pelvis W Contrast  10/05/2015  CLINICAL DATA:  82 year old male with lower abdominal pain since this morning. Left lower quadrant pain. Initial encounter. EXAM: CT ABDOMEN AND PELVIS WITH CONTRAST TECHNIQUE: Multidetector CT imaging of the abdomen and pelvis was performed using the standard protocol following bolus administration of intravenous contrast.  CONTRAST:  ISOVUE-300 IOPAMIDOL (ISOVUE-300) INJECTION 61% COMPARISON:  CT Abdomen and Pelvis  11/09/2012 FINDINGS: Stable lung bases, left lower lobe and lingula chronic atelectasis or scarring. No pericardial or pleural effusion. Scoliosis and widespread advanced disc degeneration in the spine. No acute osseous abnormality identified. Chronic fat containing left inguinal hernia appears stable. No pelvic free fluid. Unremarkable urinary bladder. Severe diverticulosis of the sigmoid colon with acute inflammation in the mid to distal sigmoid, greater on the right (series 5, image 55). A segment of about 6 cm is affected with mesenteric stranding. No extraluminal gas identified. No associated fluid collection. Severe diverticulosis  continues to the splenic flexure. No additional active inflammation. Motion artifact through the upper abdomen. The transverse colon appears negative. Negative right colon, appendix (series 5, image 57), and terminal ileum. No dilated small bowel. Incidental duodenum all diverticulum. Negative stomach. No abdominal free air or free fluid. Negative liver, gallbladder, spleen, pancreas and adrenal glands. Portal venous system is patent. Aortoiliac calcified atherosclerosis noted. Major arterial structures are patent. Both kidneys appear stable. Renal enhancement and contrast excretion within normal limits. No lymphadenopathy. IMPRESSION: 1. Severe diverticulosis of the descending and sigmoid colon segments with acute diverticulitis in the mid to distal sigmoid. No evidence of perforation or complicating features. 2. Chronic fat containing left inguinal hernia. Calcified aortic atherosclerosis. Electronically Signed   By: Odessa Fleming M.D.   On: 10/05/2015 13:20    Follow up with PCP in 1 week.  Management plans discussed with the patient, family and they are in agreement.  CODE STATUS:     Code Status Orders        Start     Ordered   10/05/15 1354  Do not attempt  resuscitation (DNR)   Continuous    Question Answer Comment  In the event of cardiac or respiratory ARREST Do not call a "code blue"   In the event of cardiac or respiratory ARREST Do not perform Intubation, CPR, defibrillation or ACLS   In the event of cardiac or respiratory ARREST Use medication by any route, position, wound care, and other measures to relive pain and suffering. May use oxygen, suction and manual treatment of airway obstruction as needed for comfort.      10/05/15 1354    Code Status History    Date Active Date Inactive Code Status Order ID Comments User Context   12/11/2012  3:20 PM 12/12/2012  5:05 PM Full Code [id number]  Standley Brooking, MD Inpatient   11/09/2012  4:50 AM 11/10/2012  2:37 PM Full Code [id number]  Edsel Petrin, DO Inpatient   04/29/2012 12:23 AM 04/29/2012  5:56 PM Full Code [id number]  Gwenevere Abbot Ward, RN Inpatient   11/14/2011  1:47 PM 11/16/2011  2:48 PM Full Code [id number]  Elliot Cousin, MD Inpatient   05/09/2011  8:55 PM 05/12/2011  1:56 PM Full Code [id number]  Haydee Monica Inpatient    Advance Directive Documentation        Most Recent Value   Type of Advance Directive  Healthcare Power of Attorney, Living will   Pre-existing out of facility DNR order (yellow form or pink MOST form)     "MOST" Form in Place?        TOTAL TIME TAKING CARE OF THIS PATIENT ON DAY OF DISCHARGE: more than 30 minutes.   Milagros Loll R M.D on 10/07/2015 at 10:36 AM  Between 7am to 6pm - Pager - [phone]  After 6pm go to [email or web address] - password [email or web address]  [facility]  Office  [phone]  CC: Primary care physician; Carylon Perches, MD  Note: This dictation was prepared with Dragon dictation along with smaller phrase technology. Any transcriptional errors that result from this process are unintentional.

## 2015-10-07 NOTE — NC FL2 (Signed)
[place] MEDICAID FL2 LEVEL OF CARE SCREENING TOOL     IDENTIFICATION  Patient Name: Larry Cox Birthdate: 09/12/1921 Sex: male Admission Date (Current Location): 10/05/2015  [place] and [place] Number:  Chiropodist and Address:  [facility], [address], [place], [place] [address]      Provider Number: [id number]  Attending Physician Name and Address:  Milagros Loll, MD  Relative Name and Phone Number:   Larry Cox [facility]) [phone]  Larry Cox (Daughter) [phone])    Current Level of Care: Hospital Recommended Level of Care: Assisted Living Facility Prior Approval Number:    Date Approved/Denied: 10/07/15 PASRR Number:    Discharge Plan: Other (Comment) (ALF)    Current Diagnoses: Patient Active Problem List   Diagnosis Date Noted  . Acute diverticulitis 10/05/2015  . Dementia 12/11/2012  . Atypical chest pain 12/11/2012  . Abdominal pain, chronic, left lower quadrant 12/11/2012  . Bilateral inguinal hernia 12/11/2012  . Acute encephalopathy 11/09/2012  . Unspecified hypothyroidism 11/09/2012  . Aphasia 11/09/2012  . Muscle cramps 11/09/2012  . Nausea with vomiting 11/09/2012  . Hypokalemia 04/29/2012  . Chronic abdominal pain 03/31/2012  . GERD (gastroesophageal reflux disease) 03/31/2012  . 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 Elevated BP 11/15/2011  . Sundowning 11/15/2011  . Metabolic acidosis 11/14/2011  . Diverticulitis of colon 11/14/2011  . Anemia 11/14/2011  . Dizziness 11/14/2011  . SBO (small bowel obstruction) (HCC) 05/09/2011  . Hyponatremia 05/09/2011    Orientation RESPIRATION BLADDER Height & Weight     Self  Normal Continent Weight: 161 lb 12.8 oz (73.392 kg) Height:   (167.6 cm)  BEHAVIORAL SYMPTOMS/MOOD NEUROLOGICAL BOWEL NUTRITION STATUS      Continent Diet (Regular)  AMBULATORY STATUS COMMUNICATION OF NEEDS Skin   Limited Assist Verbally Normal                        Personal Care Assistance Level of Assistance  Bathing, Dressing, Feeding Bathing Assistance: Limited assistance Feeding assistance: Independent Dressing Assistance: Limited assistance     Functional Limitations Info  Sight, Hearing, Speech Sight Info: Adequate Hearing Info: Adequate Speech Info: Adequate    SPECIAL CARE FACTORS FREQUENCY                       Contractures Contractures Info: Not present    Additional Factors Info  Code Status Code Status Info:  (DNR)             Current Medications (10/07/2015):  This is the current hospital active medication list Current Facility-Administered Medications  Medication Dose Route Frequency Provider Last Rate Last Dose  . acetaminophen (TYLENOL) tablet 650 mg  650 mg Oral Q6H PRN Milagros Loll, MD       Or  . acetaminophen (TYLENOL) suppository 650 mg  650 mg Rectal Q6H PRN Srikar Sudini, MD      . albuterol (PROVENTIL) (2.5 MG/3ML) 0.083% nebulizer solution 2.5 mg  2.5 mg Nebulization Q2H PRN Srikar Sudini, MD      . ciprofloxacin (CIPRO) tablet 500 mg  500 mg Oral BID Milagros Loll, MD   500 mg at 10/07/15 0819  . diphenhydrAMINE (BENADRYL) capsule 25 mg  25 mg Oral Q6H PRN Milagros Loll, MD   25 mg at 10/05/15 2348  . enoxaparin (LOVENOX) injection 40 mg  40 mg Subcutaneous Q24H Milagros Loll, MD   40 mg at 10/06/15 1327  .  haloperidol (HALDOL) tablet 1 mg  1 mg Oral Q8H PRN Milagros LollSrikar Sudini, MD   1 mg at 10/06/15 2046  . HYDROcodone-acetaminophen (NORCO/VICODIN) 5-325 MG per tablet 1-2 tablet  1-2 tablet Oral Q4H PRN Milagros LollSrikar Sudini, MD   1 tablet at 10/07/15 0615  . levothyroxine (SYNTHROID, LEVOTHROID) tablet 75 mcg  75 mcg Oral QAC breakfast Milagros LollSrikar Sudini, MD   75 mcg at 10/07/15 0819  . metroNIDAZOLE (FLAGYL) tablet 500 mg  500 mg Oral 3 times per day Milagros LollSrikar Sudini, MD   500 mg at 10/07/15 0615  . morphine 2 MG/ML injection 2 mg  2 mg Intravenous Q3H PRN Milagros LollSrikar Sudini, MD   2 mg at 10/06/15 0528  .  ondansetron (ZOFRAN) tablet 4 mg  4 mg Oral Q6H PRN Milagros LollSrikar Sudini, MD       Or  . ondansetron (ZOFRAN) injection 4 mg  4 mg Intravenous Q6H PRN Srikar Sudini, MD      . pantoprazole (PROTONIX) EC tablet 40 mg  40 mg Oral Daily Milagros LollSrikar Sudini, MD   40 mg at 10/07/15 1124  . polyethylene glycol (MIRALAX / GLYCOLAX) packet 17 g  17 g Oral Daily Srikar Sudini, MD   17 g at 10/07/15 1124     Discharge Medications: DISCHARGE MEDICATIONS:   Current Discharge Medication List    START taking these medications   Details  ciprofloxacin (CIPRO) 500 MG tablet Take 1 tablet (500 mg total) by mouth 2 (two) times daily. Qty: 14 tablet, Refills: 0    HYDROcodone-acetaminophen (NORCO/VICODIN) 5-325 MG tablet Take 1 tablet by mouth every 6 (six) hours as needed for moderate pain or severe pain. Qty: 20 tablet, Refills: 0    metroNIDAZOLE (FLAGYL) 500 MG tablet Take 1 tablet (500 mg total) by mouth every 8 (eight) hours. Qty: 21 tablet, Refills: 0      CONTINUE these medications which have NOT CHANGED   Details  acetaminophen (TYLENOL) 500 MG tablet Take 500 mg by mouth every 6 (six) hours as needed for mild pain or moderate pain.    diphenhydrAMINE (BENADRYL) 25 MG tablet Take 25 mg by mouth every 6 (six) hours as needed for itching.    famotidine (PEPCID) 20 MG tablet Take 20 mg by mouth at bedtime.    levothyroxine (SYNTHROID, LEVOTHROID) 75 MCG tablet Take 75 mcg by mouth daily before breakfast.    omeprazole (PRILOSEC) 20 MG capsule Take 20 mg by mouth daily.    ondansetron (ZOFRAN) 4 MG tablet Take 1 tablet (4 mg total) by mouth every 8 (eight) hours as needed for nausea. Qty: 20 tablet, Refills: 0    polyethylene glycol (MIRALAX / GLYCOLAX) packet Take 17 g by mouth daily as needed for mild constipation or moderate constipation.     Psyllium (REGULOID) 28.3 % POWD Take 1 scoop by mouth as needed.    triamcinolone cream (KENALOG) 0.1 % Apply  1 application topically 2 (two) times daily as needed.             Relevant Imaging Results:  Relevant Lab Results:   Additional Information  (SSN: [id number])  Starr SinclairSamantha L Terrianne Cavness, LCSW

## 2015-10-09 ENCOUNTER — Encounter: Payer: Self-pay | Admitting: Emergency Medicine

## 2015-10-09 ENCOUNTER — Emergency Department
Admission: EM | Admit: 2015-10-09 | Discharge: 2015-10-09 | Disposition: A | Discharge status: Home / Self Care | Payer: Medicare Other | Attending: Emergency Medicine | Admitting: Emergency Medicine

## 2015-10-09 DIAGNOSIS — G309 Alzheimer's disease, unspecified: Secondary | ICD-10-CM | POA: Diagnosis not present

## 2015-10-09 DIAGNOSIS — F17210 Nicotine dependence, cigarettes, uncomplicated: Secondary | ICD-10-CM | POA: Insufficient documentation

## 2015-10-09 DIAGNOSIS — F0280 Dementia in other diseases classified elsewhere without behavioral disturbance: Secondary | ICD-10-CM | POA: Insufficient documentation

## 2015-10-09 DIAGNOSIS — R42 Dizziness and giddiness: Secondary | ICD-10-CM | POA: Diagnosis not present

## 2015-10-09 DIAGNOSIS — R1030 Lower abdominal pain, unspecified: Secondary | ICD-10-CM | POA: Diagnosis not present

## 2015-10-09 DIAGNOSIS — R413 Other amnesia: Secondary | ICD-10-CM | POA: Insufficient documentation

## 2015-10-09 DIAGNOSIS — R109 Unspecified abdominal pain: Secondary | ICD-10-CM | POA: Diagnosis present

## 2015-10-09 DIAGNOSIS — M1990 Unspecified osteoarthritis, unspecified site: Secondary | ICD-10-CM | POA: Diagnosis not present

## 2015-10-09 DIAGNOSIS — K5792 Diverticulitis of intestine, part unspecified, without perforation or abscess without bleeding: Secondary | ICD-10-CM | POA: Diagnosis not present

## 2015-10-09 DIAGNOSIS — I671 Cerebral aneurysm, nonruptured: Secondary | ICD-10-CM | POA: Insufficient documentation

## 2015-10-09 DIAGNOSIS — K219 Gastro-esophageal reflux disease without esophagitis: Secondary | ICD-10-CM | POA: Diagnosis not present

## 2015-10-09 DIAGNOSIS — E079 Disorder of thyroid, unspecified: Secondary | ICD-10-CM | POA: Diagnosis not present

## 2015-10-09 DIAGNOSIS — K5669 Other intestinal obstruction: Secondary | ICD-10-CM | POA: Insufficient documentation

## 2015-10-09 DIAGNOSIS — H9190 Unspecified hearing loss, unspecified ear: Secondary | ICD-10-CM | POA: Insufficient documentation

## 2015-10-09 DIAGNOSIS — E871 Hypo-osmolality and hyponatremia: Secondary | ICD-10-CM | POA: Diagnosis not present

## 2015-10-09 DIAGNOSIS — D649 Anemia, unspecified: Secondary | ICD-10-CM | POA: Insufficient documentation

## 2015-10-09 DIAGNOSIS — K5732 Diverticulitis of large intestine without perforation or abscess without bleeding: Secondary | ICD-10-CM | POA: Insufficient documentation

## 2015-10-09 LAB — CBC WITH DIFFERENTIAL/PLATELET
BASOS ABS: 0 10*3/uL (ref 0–0.1)
Basophils Relative: 1 %
EOS ABS: 0.1 10*3/uL (ref 0–0.7)
EOS PCT: 3 %
HCT: 35 % — ABNORMAL LOW (ref 40.0–52.0)
Hemoglobin: 12 g/dL — ABNORMAL LOW (ref 13.0–18.0)
LYMPHS ABS: 0.9 10*3/uL — AB (ref 1.0–3.6)
LYMPHS PCT: 17 %
MCH: 32.5 pg (ref 26.0–34.0)
MCHC: 34.2 g/dL (ref 32.0–36.0)
MCV: 95 fL (ref 80.0–100.0)
MONO ABS: 0.5 10*3/uL (ref 0.2–1.0)
Monocytes Relative: 10 %
Neutro Abs: 3.7 10*3/uL (ref 1.4–6.5)
Neutrophils Relative %: 69 %
Platelets: 287 10*3/uL (ref 150–440)
RBC: 3.69 MIL/uL — ABNORMAL LOW (ref 4.40–5.90)
RDW: 14.8 % — AB (ref 11.5–14.5)
WBC: 5.4 10*3/uL (ref 3.8–10.6)

## 2015-10-09 LAB — COMPREHENSIVE METABOLIC PANEL
ALK PHOS: 49 U/L (ref 38–126)
ALT: 22 U/L (ref 17–63)
AST: 37 U/L (ref 15–41)
Albumin: 3.6 g/dL (ref 3.5–5.0)
Anion gap: 7 (ref 5–15)
BILIRUBIN TOTAL: 0.7 mg/dL (ref 0.3–1.2)
BUN: 7 mg/dL (ref 6–20)
CO2: 24 mmol/L (ref 22–32)
CREATININE: 0.79 mg/dL (ref 0.61–1.24)
Calcium: 8.5 mg/dL — ABNORMAL LOW (ref 8.9–10.3)
Chloride: 105 mmol/L (ref 101–111)
GFR calc Af Amer: >60 mL/min (ref >60)
GLUCOSE: 105 mg/dL — AB (ref 65–99)
POTASSIUM: 3.7 mmol/L (ref 3.5–5.1)
SODIUM: 136 mmol/L (ref 135–145)
TOTAL PROTEIN: 6.3 g/dL — AB (ref 6.5–8.1)

## 2015-10-09 LAB — LIPASE, BLOOD: LIPASE: 12 U/L (ref 11–51)

## 2015-10-09 NOTE — ED Notes (Signed)
Pt sleeping. No issues at this time.

## 2015-10-09 NOTE — ED Notes (Signed)
Pt arm wrapped up for excessive bleeding after IV removal. RN (Bill) at pt bedside and assisted with pt wrap.

## 2015-10-09 NOTE — ED Notes (Signed)
Pt ems from [facility] for low left abd pain. Pt laughing and cutting up in room. States that abd hurts when he pushes on it. Pt seen here 4/7 for diverticulitis. Taking cipro and flagyl. Poor historian, hx dementia

## 2015-10-09 NOTE — ED Provider Notes (Addendum)
[facility] Emergency Department Provider Note  ____________________________________________  Time seen: Approximately 10:29 AM  I have reviewed the triage vital signs and the nursing notes.   HISTORY  Chief Complaint Abdominal Pain    HPI Larry Cox is a 82 y.o. male who is here couple days ago for abdominal pain and was found to have diverticulitis. He does not really remember the previous visit. He says he's taking all of his medications. He says his pain really hasn't seemed to get any worse or any better. He seems to be having a little bit of trouble with dementia. He is wearing a [employer] War [employer], but if treatments But cannot tell me where he fought her what happened. Does remember finding the [employer] however.   Past Medical History  Diagnosis Date  . Hyponatremia   . Small bowel obstruction (HCC)   . Hearing deficit   . Arthritis   . Chronic back pain   . 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  . Anemia   . Dizziness   . Diverticulitis of colon 11/14/2011  . GERD (gastroesophageal reflux disease)   . Hernia     hiatal and inguinal  . Chronic abdominal pain   . Chronic nausea   . Memory deficits   . Alzheimer's dementia   . Thyroid disease     Patient Active Problem List   Diagnosis Date Noted  . Acute diverticulitis 10/05/2015  . Dementia 12/11/2012  . Atypical chest pain 12/11/2012  . Abdominal pain, chronic, left lower quadrant 12/11/2012  . Bilateral inguinal hernia 12/11/2012  . Acute encephalopathy 11/09/2012  . Unspecified hypothyroidism 11/09/2012  . Aphasia 11/09/2012  . Muscle cramps 11/09/2012  . Nausea with vomiting 11/09/2012  . Hypokalemia 04/29/2012  . Chronic abdominal pain 03/31/2012  . GERD (gastroesophageal reflux disease) 03/31/2012  . 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 Elevated BP 11/15/2011  . Sundowning 11/15/2011  . Metabolic acidosis 11/14/2011  . Diverticulitis of colon  11/14/2011  . Anemia 11/14/2011  . Dizziness 11/14/2011  . SBO (small bowel obstruction) (HCC) 05/09/2011  . Hyponatremia 05/09/2011    Past Surgical History  Procedure Laterality Date  . Colonoscopy  04/08/2012    RMR: Pancolonic diverticulosis    Current Outpatient Rx  Name  Route  Sig  Dispense  Refill  . acetaminophen (TYLENOL) 500 MG tablet   Oral   Take 500 mg by mouth every 6 (six) hours as needed for mild pain or moderate pain.         . ciprofloxacin (CIPRO) 500 MG tablet   Oral   Take 1 tablet (500 mg total) by mouth 2 (two) times daily. Patient taking differently: Take 500 mg by mouth 2 (two) times daily. Starting on 10/08/15   14 tablet   0   . diphenhydrAMINE (BENADRYL) 25 MG tablet   Oral   Take 25 mg by mouth every 6 (six) hours as needed for itching.         . famotidine (PEPCID) 20 MG tablet   Oral   Take 20 mg by mouth at bedtime.         Marland Kitchen HYDROcodone-acetaminophen (NORCO/VICODIN) 5-325 MG tablet   Oral   Take 1 tablet by mouth every 6 (six) hours as needed for moderate pain or severe pain.   20 tablet   0   . levothyroxine (SYNTHROID, LEVOTHROID) 75 MCG tablet   Oral   Take 75 mcg by mouth daily  before breakfast.         . metroNIDAZOLE (FLAGYL) 500 MG tablet   Oral   Take 1 tablet (500 mg total) by mouth every 8 (eight) hours.   21 tablet   0   . omeprazole (PRILOSEC) 20 MG capsule   Oral   Take 20 mg by mouth daily.         . ondansetron (ZOFRAN) 4 MG tablet   Oral   Take 1 tablet (4 mg total) by mouth every 8 (eight) hours as needed for nausea.   20 tablet   0   . polyethylene glycol (MIRALAX / GLYCOLAX) packet   Oral   Take 17 g by mouth daily as needed for mild constipation or moderate constipation.          . Psyllium (REGULOID) 28.3 % POWD   Oral   Take 1 scoop by mouth as needed.         . triamcinolone cream (KENALOG) 0.1 %   Topical   Apply 1 application topically 2 (two) times daily as needed.            Allergies Aricept; Diazepam; Doxycycline; and Lorazepam  Family History  Problem Relation Age of Onset  . Colon cancer Neg Hx   . Cancer Mother     stomach?    Social History Social History  Substance Use Topics  . Smoking status: Former Smoker -- 0.30 packs/day    Types: Cigarettes  . Smokeless tobacco: Former Neurosurgeon    Quit date: 07/29/1958  . Alcohol Use: No     Comment: 1 beer occasionally    Review of Systems Constitutional: No fever/chills Eyes: No visual changes. ENT: No sore throat. Cardiovascular: Denies chest pain. Respiratory: Denies shortness of breath. Gastrointestinal:abdominal pain.  No nausea, no vomiting.  No diarrhea.  No constipation. Genitourinary: Negative for dysuria. Musculoskeletal: Negative for back pain. Skin: Negative for rash. Neurological: Negative for headaches, focal weakness or numbness.  10-point ROS otherwise negative.  ____________________________________________   PHYSICAL EXAM:  VITAL SIGNS: ED Triage Vitals  Enc Vitals Group     BP 10/09/15 1004 137/73 mmHg     Pulse Rate 10/09/15 1004 65     Resp --      Temp 10/09/15 1004 97.5 F (36.4 C)     Temp Source 10/09/15 1004 Oral     SpO2 10/09/15 1004 100 %     Weight 10/09/15 1004 168 lb (76.204 kg)     Height --      Head Cir --      Peak Flow --      Pain Score --      Pain Loc --      Pain Edu? --      Excl. in GC? --     Constitutional: Alert and oriented. Well appearing and in no acute distress. Eyes: Conjunctivae are normal. PERRL. EOMI. Head: Atraumatic. Nose: No congestion/rhinnorhea. Mouth/Throat: Mucous membranes are moist.  Oropharynx non-erythematous. Neck: No stridor.   Cardiovascular: Normal rate, regular rhythm. Grossly normal heart sounds.  Good peripheral circulation. Respiratory: Normal respiratory effort.  No retractions. Lungs CTAB. Gastrointestinal: Soft and Tender to fairly deep palpation in the lower left abdomen. No distention. No  abdominal bruits. No CVA tenderness. Musculoskeletal: No lower extremity tenderness nor edema.  No joint effusions. Neurologic:  Normal speech and language. No gross focal neurologic deficits are appreciated. No gait instability. Skin:  Skin is warm, dry and intact. No rash noted. Psychiatric:  Mood and affect are normal. Speech and behavior are normal.  ____________________________________________   LABS (all labs ordered are listed, but only abnormal results are displayed)  Labs Reviewed  COMPREHENSIVE METABOLIC PANEL - Abnormal; Notable for the following:    Glucose, Bld 105 (*)    Calcium 8.5 (*)    Total Protein 6.3 (*)    All other components within normal limits  CBC WITH DIFFERENTIAL/PLATELET - Abnormal; Notable for the following:    RBC 3.69 (*)    Hemoglobin 12.0 (*)    HCT 35.0 (*)    RDW 14.8 (*)    Lymphs Abs 0.9 (*)    All other components within normal limits  LIPASE, BLOOD  URINALYSIS COMPLETEWITH MICROSCOPIC ([facility] ONLY)   ____________________________________________  EKG   ____________________________________________  RADIOLOGY   ____________________________________________   PROCEDURES    ____________________________________________   INITIAL IMPRESSION / ASSESSMENT AND PLAN / ED COURSE  Pertinent labs & imaging results that were available during my care of the patient were reviewed by me and considered in my medical decision making (see chart for details).  Patient's blood work looks better. Patient's belly is only tender to deep palpation. Patient is on appropriate antibiotics is afebrile and not vomiting I will let him go back home follow up with his primary care as he now wants to.. ____________________________________________   FINAL CLINICAL IMPRESSION(S) / ED DIAGNOSES  Final diagnoses:  Diverticulitis of intestine without perforation or abscess without bleeding      Arnaldo NatalPaul F Graciella Arment, MD 10/09/15 1140  I spoke with the  patient's daughter. She will check on him every day and make sure he continues to improve. Patient does have some lower abdominal tenderness but is otherwise acting and feeling fairly good. His labs have improved. His daughter will tell the nursing him to keep a close eye on him and return for any worsening at all.  Arnaldo NatalPaul F Darilyn Storbeck, MD 10/09/15 1240

## 2015-10-09 NOTE — ED Notes (Signed)
On removing IV skin bruised in an area 3" x 5". Pressure applied and then pressure dressing applied. Daughter at bedside and instructed to remove when pt returns to [facility]. britney at [facility] called and updated in pts condition, left arm bruising, discharge paperwork, and pending return. Packet with discharge instructions sent with pts daughter.

## 2015-10-09 NOTE — Discharge Instructions (Signed)
Diverticulitis Diverticulitis is when small pockets that have formed in your colon (large intestine) become infected or swollen. HOME CARE  Follow your doctor's instructions.  Follow a special diet if told by your doctor.  When you feel better, your doctor may tell you to change your diet. You may be told to eat a lot of fiber. Fruits and vegetables are good sources of fiber. Fiber makes it easier to poop (have bowel movements).  Take supplements or probiotics as told by your doctor.  Only take medicines as told by your doctor.  Keep all follow-up visits with your doctor. GET HELP IF:  Your pain does not get better.  You have a hard time eating food.  You are not pooping like normal. GET HELP RIGHT AWAY IF:  Your pain gets worse.  Your problems do not get better.  Your problems suddenly get worse.  You have a fever.  You keep throwing up (vomiting).  You have bloody or black, tarry poop (stool). MAKE SURE YOU:   Understand these instructions.  Will watch your condition.  Will get help right away if you are not doing well or get worse.   This information is not intended to replace advice given to you by your health care provider. Make sure you discuss any questions you have with your health care provider.   Document Released: 12/03/2007 Document Revised: 06/21/2013 Document Reviewed: 05/11/2013 Elsevier Interactive Patient Education [employer]2016 Elsevier Inc.   Patient has yelled out a couple times while he's been in the ER saying I don't feel good. I believe his yelling is because he does not feel completely back to normal yet and his belly is still hurting. I do not believe his dementia is acting up at this time. Please keep a close eye on him if he gets worse runs a fever or has any vomiting or becomes more focal please send him back as any of these could be a sign of worsening.   Please continue your medications. Follow your doctor in one or 2 days return for worse  pain, fever or bleeding.

## 2015-10-11 ENCOUNTER — Emergency Department
Admission: EM | Admit: 2015-10-11 | Discharge: 2015-10-11 | Disposition: A | Discharge status: Home / Self Care | Payer: Medicare Other | Attending: Student | Admitting: Student

## 2015-10-11 ENCOUNTER — Emergency Department: Payer: Medicare Other

## 2015-10-11 DIAGNOSIS — Z79899 Other long term (current) drug therapy: Secondary | ICD-10-CM | POA: Insufficient documentation

## 2015-10-11 DIAGNOSIS — R1032 Left lower quadrant pain: Secondary | ICD-10-CM | POA: Diagnosis not present

## 2015-10-11 DIAGNOSIS — Z87891 Personal history of nicotine dependence: Secondary | ICD-10-CM | POA: Diagnosis not present

## 2015-10-11 DIAGNOSIS — R05 Cough: Secondary | ICD-10-CM | POA: Diagnosis not present

## 2015-10-11 DIAGNOSIS — R109 Unspecified abdominal pain: Secondary | ICD-10-CM

## 2015-10-11 DIAGNOSIS — I671 Cerebral aneurysm, nonruptured: Secondary | ICD-10-CM | POA: Insufficient documentation

## 2015-10-11 DIAGNOSIS — E079 Disorder of thyroid, unspecified: Secondary | ICD-10-CM | POA: Insufficient documentation

## 2015-10-11 DIAGNOSIS — E039 Hypothyroidism, unspecified: Secondary | ICD-10-CM | POA: Insufficient documentation

## 2015-10-11 DIAGNOSIS — K5732 Diverticulitis of large intestine without perforation or abscess without bleeding: Secondary | ICD-10-CM | POA: Insufficient documentation

## 2015-10-11 DIAGNOSIS — R1084 Generalized abdominal pain: Secondary | ICD-10-CM | POA: Diagnosis not present

## 2015-10-11 DIAGNOSIS — R059 Cough, unspecified: Secondary | ICD-10-CM

## 2015-10-11 LAB — COMPREHENSIVE METABOLIC PANEL
ALBUMIN: 3.7 g/dL (ref 3.5–5.0)
ALT: 24 U/L (ref 17–63)
AST: 32 U/L (ref 15–41)
Alkaline Phosphatase: 48 U/L (ref 38–126)
Anion gap: 5 (ref 5–15)
BILIRUBIN TOTAL: 0.6 mg/dL (ref 0.3–1.2)
BUN: 6 mg/dL (ref 6–20)
CALCIUM: 8.5 mg/dL — AB (ref 8.9–10.3)
CHLORIDE: 103 mmol/L (ref 101–111)
CO2: 26 mmol/L (ref 22–32)
Creatinine, Ser: 0.79 mg/dL (ref 0.61–1.24)
GFR calc Af Amer: >60 mL/min (ref >60)
GFR calc non Af Amer: >60 mL/min (ref >60)
GLUCOSE: 103 mg/dL — AB (ref 65–99)
POTASSIUM: 3.4 mmol/L — AB (ref 3.5–5.1)
Sodium: 134 mmol/L — ABNORMAL LOW (ref 135–145)
TOTAL PROTEIN: 6.5 g/dL (ref 6.5–8.1)

## 2015-10-11 LAB — CBC WITH DIFFERENTIAL/PLATELET
BASOS ABS: 0.1 10*3/uL (ref 0–0.1)
BASOS PCT: 1 %
Eosinophils Absolute: 0.1 10*3/uL (ref 0–0.7)
Eosinophils Relative: 2 %
HEMATOCRIT: 37.2 % — AB (ref 40.0–52.0)
Hemoglobin: 12.6 g/dL — ABNORMAL LOW (ref 13.0–18.0)
Lymphocytes Relative: 12 %
Lymphs Abs: 0.9 10*3/uL — ABNORMAL LOW (ref 1.0–3.6)
MCH: 32.3 pg (ref 26.0–34.0)
MCHC: 34 g/dL (ref 32.0–36.0)
MCV: 95 fL (ref 80.0–100.0)
MONO ABS: 0.8 10*3/uL (ref 0.2–1.0)
Monocytes Relative: 10 %
NEUTROS ABS: 6.1 10*3/uL (ref 1.4–6.5)
NEUTROS PCT: 75 %
Platelets: 324 10*3/uL (ref 150–440)
RBC: 3.91 MIL/uL — ABNORMAL LOW (ref 4.40–5.90)
RDW: 14.8 % — AB (ref 11.5–14.5)
WBC: 8.1 10*3/uL (ref 3.8–10.6)

## 2015-10-11 LAB — URINALYSIS COMPLETE WITH MICROSCOPIC (ARMC ONLY)
BILIRUBIN URINE: NEGATIVE
GLUCOSE, UA: NEGATIVE mg/dL
HGB URINE DIPSTICK: NEGATIVE
KETONES UR: NEGATIVE mg/dL
NITRITE: NEGATIVE
PH: 8 (ref 5.0–8.0)
Protein, ur: NEGATIVE mg/dL
SPECIFIC GRAVITY, URINE: 1.006 (ref 1.005–1.030)

## 2015-10-11 LAB — TROPONIN I

## 2015-10-11 LAB — LIPASE, BLOOD: Lipase: 13 U/L (ref 11–51)

## 2015-10-11 MED ORDER — OXYCODONE HCL 5 MG PO TABS: 5.0000 mg | ORAL_TABLET | Freq: Four times a day (QID) | ORAL | Status: DC | PRN

## 2015-10-11 MED ORDER — DIATRIZOATE MEGLUMINE & SODIUM 66-10 % PO SOLN
15.0000 mL | Freq: Once | ORAL | Status: AC
  Administered 2015-10-11: 15 mL via ORAL
  Filled 2015-10-11: qty 30

## 2015-10-11 MED ORDER — SODIUM CHLORIDE 0.9 % IV BOLUS (SEPSIS)
500.0000 mL | Freq: Once | INTRAVENOUS | Status: AC
  Administered 2015-10-11: 500 mL via INTRAVENOUS

## 2015-10-11 MED ORDER — OXYCODONE HCL 5 MG PO TABS
5.0000 mg | ORAL_TABLET | Freq: Once | ORAL | Status: AC
  Administered 2015-10-11: 5 mg via ORAL
  Filled 2015-10-11: qty 1

## 2015-10-11 MED ORDER — IOPAMIDOL (ISOVUE-300) INJECTION 61%
100.0000 mL | Freq: Once | INTRAVENOUS | Status: AC | PRN
  Administered 2015-10-11: 100 mL via INTRAVENOUS

## 2015-10-11 MED ORDER — ACETAMINOPHEN 500 MG PO TABS: 500.0000 mg | ORAL_TABLET | Freq: Four times a day (QID) | ORAL | Status: DC

## 2015-10-11 NOTE — ED Notes (Signed)
[facility] c/o abdominal pain. Per EMS pt c/o abdominal pain as well as neck pain, [facility] nor[facility] without relief. Pt has also had a productive cough.

## 2015-10-11 NOTE — ED Provider Notes (Signed)
[facility] Emergency Department Provider Note  ____________________________________________  Time seen: Approximately 11:28 AM  I have reviewed the triage vital signs and the nursing notes.   HISTORY  Chief Complaint Abdominal Pain  Caveat-history of present illness and review of systems Limited due to the patient's dementia. Information is obtained from EMS on arrival as well as staff at [facility].  HPI JEURY MCNAB is a 82 y.o. male with history of dementia, chronic abdominal pain, back pain and hypothyroidism who presents for evaluation of diffuse abdominal pain. Of note, the patient was discharged from [facility] on 10/07/2015 after treatment for sigmoid diverticulitis, he was discharged with ciprofloxacin and has been taking Norco for pain though this has not seemed to help. He returned to this emergency department 2 days ago and was seen in this emergency department for continued abdominal pain with reassuring lab work. He was discharged back to his living facility. He returns today with the same abdominal pain. He is also complaining of mild  pain in the back of his neck which is mild and worse with movement however those are his typical pain complaints according to staff at [facility]. No recent falls or trauma, no fevers, vomiting or diarrhea. He has had runny nose and productive cough over the past few days as well.   Past Medical History  Diagnosis Date  . Hyponatremia   . Small bowel obstruction (HCC)   . Hearing deficit   . Arthritis   . Chronic back pain   . 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  . Anemia   . Dizziness   . Diverticulitis of colon 11/14/2011  . GERD (gastroesophageal reflux disease)   . Hernia     hiatal and inguinal  . Chronic abdominal pain   . Chronic nausea   . Memory deficits   . Alzheimer's dementia   . Thyroid disease     Patient Active Problem List   Diagnosis Date Noted  .  Acute diverticulitis 10/05/2015  . Dementia 12/11/2012  . Atypical chest pain 12/11/2012  . Abdominal pain, chronic, left lower quadrant 12/11/2012  . Bilateral inguinal hernia 12/11/2012  . Acute encephalopathy 11/09/2012  . Unspecified hypothyroidism 11/09/2012  . Aphasia 11/09/2012  . Muscle cramps 11/09/2012  . Nausea with vomiting 11/09/2012  . Hypokalemia 04/29/2012  . Chronic abdominal pain 03/31/2012  . GERD (gastroesophageal reflux disease) 03/31/2012  . 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 Elevated BP 11/15/2011  . Sundowning 11/15/2011  . Metabolic acidosis 11/14/2011  . Diverticulitis of colon 11/14/2011  . Anemia 11/14/2011  . Dizziness 11/14/2011  . SBO (small bowel obstruction) (HCC) 05/09/2011  . Hyponatremia 05/09/2011    Past Surgical History  Procedure Laterality Date  . Colonoscopy  04/08/2012    RMR: Pancolonic diverticulosis    Current Outpatient Rx  Name  Route  Sig  Dispense  Refill  . acetaminophen (TYLENOL) 500 MG tablet   Oral   Take 500 mg by mouth every 6 (six) hours as needed for mild pain or moderate pain.         . ciprofloxacin (CIPRO) 500 MG tablet   Oral   Take 1 tablet (500 mg total) by mouth 2 (two) times daily. Patient taking differently: Take 500 mg by mouth 2 (two) times daily. Starting on 10/08/15   14 tablet   0   . diphenhydrAMINE (BENADRYL) 25 MG tablet   Oral   Take 25 mg by mouth  every 6 (six) hours as needed for itching.         . famotidine (PEPCID) 20 MG tablet   Oral   Take 20 mg by mouth at bedtime.         Marland Kitchen HYDROcodone-acetaminophen (NORCO/VICODIN) 5-325 MG tablet   Oral   Take 1 tablet by mouth every 6 (six) hours as needed for moderate pain or severe pain.   20 tablet   0   . levothyroxine (SYNTHROID, LEVOTHROID) 75 MCG tablet   Oral   Take 75 mcg by mouth daily before breakfast.         . metroNIDAZOLE (FLAGYL) 500 MG tablet   Oral   Take 1 tablet (500 mg total) by mouth every 8  (eight) hours.   21 tablet   0   . omeprazole (PRILOSEC) 20 MG capsule   Oral   Take 20 mg by mouth daily.         . ondansetron (ZOFRAN) 4 MG tablet   Oral   Take 1 tablet (4 mg total) by mouth every 8 (eight) hours as needed for nausea.   20 tablet   0   . polyethylene glycol (MIRALAX / GLYCOLAX) packet   Oral   Take 17 g by mouth daily as needed for mild constipation or moderate constipation.          . Psyllium (REGULOID) 28.3 % POWD   Oral   Take 1 scoop by mouth as needed.         . triamcinolone cream (KENALOG) 0.1 %   Topical   Apply 1 application topically 2 (two) times daily as needed.           Allergies Aricept; Diazepam; Doxycycline; and Lorazepam  Family History  Problem Relation Age of Onset  . Colon cancer Neg Hx   . Cancer Mother     stomach?    Social History Social History  Substance Use Topics  . Smoking status: Former Smoker -- 0.30 packs/day    Types: Cigarettes  . Smokeless tobacco: Former Neurosurgeon    Quit date: 07/29/1958  . Alcohol Use: No     Comment: 1 beer occasionally    Review of Systems  Gastrointestinal: + abdominal pain.  No nausea, no vomiting.  No diarrhea.  No constipation.   Caveat-history of present illness and review of systems Limited due to the patient's dementia. Information is obtained from EMS on arrival as well as staff at [facility]. ____________________________________________   PHYSICAL EXAM:  Filed Vitals:   10/11/15 1130 10/11/15 1200 10/11/15 1300 10/11/15 1400  BP: 136/85 109/92 119/84 136/74  Pulse: 69 67  57  Temp:      TempSrc:      Resp: 18   19  Height:      Weight:      SpO2: 100% 96%  97%     Constitutional: Alert and oriented to self only. Pleasantly demented, laughing, telling jokes. Well appearing and in no acute distress. Eyes: Conjunctivae are normal. PERRL. EOMI. Head: Atraumatic. Nose: No congestion/rhinnorhea. Mouth/Throat: Mucous membranes are moist.  Oropharynx  non-erythematous. Neck: No stridor.  No cervical spine tenderness to palpation. Mild tenderness in the right and left paraspinal muscles of the C-spine. Cardiovascular: Normal rate, regular rhythm. Grossly normal heart sounds.  Good peripheral circulation. Respiratory: Normal respiratory effort.  No retractions. Lungs CTAB. Gastrointestinal: Soft with moderate tenderness to palpation throughout the lower abdomen, worse in the left lower quadrant. No CVA tenderness. Genitourinary: Deferred  Musculoskeletal: No lower extremity tenderness nor edema.  No joint effusions. Neurologic:  Normal speech and language. No gross focal neurologic deficits are appreciated.  Skin:  Skin is warm, dry and intact. No rash noted. Psychiatric: Mood and affect are normal. Speech and behavior are normal.  ____________________________________________   LABS (all labs ordered are listed, but only abnormal results are displayed)  Labs Reviewed  CBC WITH DIFFERENTIAL/PLATELET - Abnormal; Notable for the following:    RBC 3.91 (*)    Hemoglobin 12.6 (*)    HCT 37.2 (*)    RDW 14.8 (*)    Lymphs Abs 0.9 (*)    All other components within normal limits  COMPREHENSIVE METABOLIC PANEL - Abnormal; Notable for the following:    Sodium 134 (*)    Potassium 3.4 (*)    Glucose, Bld 103 (*)    Calcium 8.5 (*)    All other components within normal limits  URINALYSIS COMPLETEWITH MICROSCOPIC ([facility] ONLY) - Abnormal; Notable for the following:    Color, Urine YELLOW (*)    APPearance CLEAR (*)    Leukocytes, UA 1+ (*)    Bacteria, UA RARE (*)    Squamous Epithelial / LPF 0-5 (*)    All other components within normal limits  TROPONIN I  LIPASE, BLOOD   ____________________________________________  EKG  ED ECG REPORT I, Gayla Doss, the attending physician, personally viewed and interpreted this ECG.   Date: 10/11/2015  EKG Time: 11:26  Rate: 67  Rhythm: normal sinus rhythm with PACs  Axis: normal   Intervals:nonspecific intraventricular conduction delay  ST&T Change: No acute ST elevation.  ____________________________________________  RADIOLOGY  CXR IMPRESSION: No active cardiopulmonary disease. Stable scarring in left lower lobe.  CT abdomen and pelvis IMPRESSION: Resolving sigmoid diverticulitis. No abscess or complication.  No new abnormality identified since the prior CT. ____________________________________________   PROCEDURES  Procedure(s) performed: None  Critical Care performed: No  ____________________________________________   INITIAL IMPRESSION / ASSESSMENT AND PLAN / ED COURSE  Pertinent labs & imaging results that were available during my care of the patient were reviewed by me and considered in my medical decision making (see chart for details).  RUBERT FREDIANI is a 82 y.o. male with history of dementia, chronic abdominal pain, back pain and hypothyroidism who presents for evaluation of diffuse abdominal pain. On exam, he is very well-appearing and in no acute distress. Vital signs stable, he is afebrile. He is awake and alert, pleasantly demented. He does have tenderness to palpation throughout the lower abdomen, worse in the left lower quadrant. He also has frequent cough. I suspect the pain in his neck is muscular. His neck is supple without meningismus. EKG not consistent with ischemia. Plan for basic labs and will likely repeat CT of the abdomen and pelvis given his persistent pain. Reassess for disposition.  ----------------------------------------- 2:13 PM on 10/11/2015 ----------------------------------------- Patient continues to appear well. He is not requiring any IV medications for pain control. Labs reviewed and are notable for mild chronic anemia which is unchanged from prior. CMP generally unremarkable. Negative troponin. Urinalysis with 1+ leukocytes however minimal red cells, white blood cells, no nitrates, no urinary complaints. We'll  send urine culture. Normal lipase. Chest xray shows no acute Cardiopulmonary abnormality. CT of the abdomen and pelvis shows resolving sigmoid diverticulitis without complication and I suspect this is the most likely cause of his pain. We will augment pain regimen and start scheduled Tylenol with every six-hour oxycodone as needed for pain.  He will continue course of cipro. I discussed return precautions with his daughter at bedside she is comfortable with the discharge plan. [facility]. ____________________________________________   FINAL CLINICAL IMPRESSION(S) / ED DIAGNOSES  Final diagnoses:  None      Gayla DossEryka A Rheana Casebolt, MD 10/11/15 1419

## 2015-10-29 ENCOUNTER — Emergency Department: Payer: Medicare Other

## 2015-10-29 ENCOUNTER — Encounter: Payer: Self-pay | Admitting: *Deleted

## 2015-10-29 ENCOUNTER — Emergency Department
Admission: EM | Admit: 2015-10-29 | Discharge: 2015-10-29 | Disposition: A | Discharge status: Home / Self Care | Payer: Medicare Other | Attending: Emergency Medicine | Admitting: Emergency Medicine

## 2015-10-29 DIAGNOSIS — Z79899 Other long term (current) drug therapy: Secondary | ICD-10-CM | POA: Insufficient documentation

## 2015-10-29 DIAGNOSIS — M542 Cervicalgia: Secondary | ICD-10-CM

## 2015-10-29 DIAGNOSIS — E039 Hypothyroidism, unspecified: Secondary | ICD-10-CM | POA: Diagnosis not present

## 2015-10-29 DIAGNOSIS — Z87891 Personal history of nicotine dependence: Secondary | ICD-10-CM | POA: Diagnosis not present

## 2015-10-29 DIAGNOSIS — K828 Other specified diseases of gallbladder: Secondary | ICD-10-CM | POA: Diagnosis not present

## 2015-10-29 DIAGNOSIS — R109 Unspecified abdominal pain: Secondary | ICD-10-CM | POA: Diagnosis present

## 2015-10-29 DIAGNOSIS — K219 Gastro-esophageal reflux disease without esophagitis: Secondary | ICD-10-CM | POA: Diagnosis not present

## 2015-10-29 DIAGNOSIS — K5730 Diverticulosis of large intestine without perforation or abscess without bleeding: Secondary | ICD-10-CM | POA: Diagnosis not present

## 2015-10-29 DIAGNOSIS — K811 Chronic cholecystitis: Secondary | ICD-10-CM | POA: Diagnosis not present

## 2015-10-29 DIAGNOSIS — M1990 Unspecified osteoarthritis, unspecified site: Secondary | ICD-10-CM | POA: Insufficient documentation

## 2015-10-29 DIAGNOSIS — I6522 Occlusion and stenosis of left carotid artery: Secondary | ICD-10-CM | POA: Diagnosis not present

## 2015-10-29 DIAGNOSIS — R531 Weakness: Secondary | ICD-10-CM | POA: Diagnosis not present

## 2015-10-29 LAB — COMPREHENSIVE METABOLIC PANEL
ALK PHOS: 41 U/L (ref 38–126)
ALT: 14 U/L — AB (ref 17–63)
AST: 21 U/L (ref 15–41)
Albumin: 3.9 g/dL (ref 3.5–5.0)
Anion gap: 10 (ref 5–15)
BILIRUBIN TOTAL: 0.4 mg/dL (ref 0.3–1.2)
BUN: 11 mg/dL (ref 6–20)
CALCIUM: 8.9 mg/dL (ref 8.9–10.3)
CO2: 25 mmol/L (ref 22–32)
CREATININE: 0.81 mg/dL (ref 0.61–1.24)
Chloride: 104 mmol/L (ref 101–111)
Glucose, Bld: 99 mg/dL (ref 65–99)
Potassium: 4.1 mmol/L (ref 3.5–5.1)
Sodium: 139 mmol/L (ref 135–145)
Total Protein: 6.3 g/dL — ABNORMAL LOW (ref 6.5–8.1)

## 2015-10-29 LAB — URINE DRUG SCREEN, QUALITATIVE (ARMC ONLY)
AMPHETAMINES, UR SCREEN: NOT DETECTED
BENZODIAZEPINE, UR SCRN: NOT DETECTED
Barbiturates, Ur Screen: NOT DETECTED
CANNABINOID 50 NG, UR ~~LOC~~: NOT DETECTED
Cocaine Metabolite,Ur ~~LOC~~: NOT DETECTED
MDMA (Ecstasy)Ur Screen: NOT DETECTED
Methadone Scn, Ur: NOT DETECTED
OPIATE, UR SCREEN: POSITIVE — AB
PHENCYCLIDINE (PCP) UR S: NOT DETECTED
Tricyclic, Ur Screen: NOT DETECTED

## 2015-10-29 LAB — URINALYSIS COMPLETE WITH MICROSCOPIC (ARMC ONLY)
BILIRUBIN URINE: NEGATIVE
Bacteria, UA: NONE SEEN
GLUCOSE, UA: NEGATIVE mg/dL
Hgb urine dipstick: NEGATIVE
KETONES UR: NEGATIVE mg/dL
Nitrite: NEGATIVE
Protein, ur: NEGATIVE mg/dL
Specific Gravity, Urine: 1.015 (ref 1.005–1.030)
pH: 7 (ref 5.0–8.0)

## 2015-10-29 LAB — CBC WITH DIFFERENTIAL/PLATELET
Basophils Absolute: 0 10*3/uL (ref 0–0.1)
Basophils Relative: 1 %
Eosinophils Absolute: 0.2 10*3/uL (ref 0–0.7)
Eosinophils Relative: 5 %
HEMATOCRIT: 35.1 % — AB (ref 40.0–52.0)
Hemoglobin: 12 g/dL — ABNORMAL LOW (ref 13.0–18.0)
LYMPHS PCT: 29 %
Lymphs Abs: 1.2 10*3/uL (ref 1.0–3.6)
MCH: 32.5 pg (ref 26.0–34.0)
MCHC: 34.2 g/dL (ref 32.0–36.0)
MCV: 95.1 fL (ref 80.0–100.0)
MONO ABS: 0.7 10*3/uL (ref 0.2–1.0)
MONOS PCT: 16 %
NEUTROS ABS: 2.2 10*3/uL (ref 1.4–6.5)
Neutrophils Relative %: 49 %
PLATELETS: 244 10*3/uL (ref 150–440)
RBC: 3.69 MIL/uL — ABNORMAL LOW (ref 4.40–5.90)
RDW: 14.9 % — AB (ref 11.5–14.5)
WBC: 4.3 10*3/uL (ref 3.8–10.6)

## 2015-10-29 LAB — LIPASE, BLOOD: LIPASE: 15 U/L (ref 11–51)

## 2015-10-29 LAB — ACETAMINOPHEN LEVEL: ACETAMINOPHEN (TYLENOL), SERUM: 17 ug/mL (ref 10–30)

## 2015-10-29 LAB — ETHANOL

## 2015-10-29 LAB — TROPONIN I: Troponin I: <0.03 ng/mL (ref <0.031)

## 2015-10-29 LAB — SALICYLATE LEVEL

## 2015-10-29 MED ORDER — IOPAMIDOL (ISOVUE-370) INJECTION 76%
100.0000 mL | Freq: Once | INTRAVENOUS | Status: AC | PRN
  Administered 2015-10-29: 100 mL via INTRAVENOUS

## 2015-10-29 MED ORDER — DIATRIZOATE MEGLUMINE & SODIUM 66-10 % PO SOLN
15.0000 mL | Freq: Once | ORAL | Status: DC
  Filled 2015-10-29: qty 30

## 2015-10-29 NOTE — ED Notes (Signed)
Patient transported to CT 

## 2015-10-29 NOTE — ED Notes (Signed)
Daughter in with pt now.

## 2015-10-29 NOTE — ED Notes (Signed)
Patient returned from CT

## 2015-10-29 NOTE — ED Notes (Signed)

## 2015-10-29 NOTE — ED Notes (Signed)
Pt brought in via ems from [facility].    Pt reports he has abd pain and neck pain.  Pt was seen in er 4/13 for similar sx.  Pt denies v/d.  No injury to neck.  Pt denies falling.  Pt seems confused.  Pt denies SI or HI.  Pt then states i don't think i want to hurt myself.   Pt denies drug use or etoh.  Pt talkative.

## 2015-10-29 NOTE — Discharge Instructions (Signed)
Cholecystitis Cholecystitis is inflammation of the gallbladder. It is often called a gallbladder attack. The gallbladder is a pear-shaped organ that lies beneath the liver on the right side of the body. The gallbladder stores bile, which is a fluid that helps the body to digest fats. If bile builds up in your gallbladder, your gallbladder becomes inflamed. This condition may occur suddenly (be acute). Repeat episodes of acute cholecystitis or prolonged episodes may lead to a long-term (chronic) condition. Cholecystitis is serious and it requires treatment.  CAUSES The most common cause of this condition is gallstones. Gallstones can block the tube (duct) that carries bile out of your gallbladder. This causes bile to build up. Other causes of this condition include:  Damage to the gallbladder due to a decrease in blood flow.  Infections in the bile ducts.  Scars or kinks in the bile ducts.  Tumors in the liver, pancreas, or gallbladder. RISK FACTORS This condition is more likely to develop in:  People who have sickle cell disease.  People who take birth control pills or use estrogen.  People who have alcoholic liver disease.  People who have liver cirrhosis.  People who have their nutrition delivered through a vein (parenteral nutrition).  People who do not eat or drink (do fasting) for a long period of time.  People who are obese.  People who have rapid weight loss.  People who are pregnant.  People who have increased triglyceride levels.  People who have pancreatitis. SYMPTOMS Symptoms of this condition include:  Abdominal pain, especially in the upper right area of the abdomen.  Abdominal tenderness or bloating.  Nausea.  Vomiting.  Fever.  Chills.  Yellowing of the skin and the whites of the eyes (jaundice). DIAGNOSIS This condition is diagnosed with a medical history and physical exam. You may also have other tests, including:  Imaging tests, such as:  An  ultrasound of the gallbladder.  A CT scan of the abdomen.  A gallbladder nuclear scan (HIDA scan). This scan allows your health care provider to see the bile moving from your liver to your gallbladder and to your small intestine.  MRI.  Blood tests, such as:  A complete blood count, because the white blood cell count may be higher than normal.  Liver function tests, because some levels may be higher than normal with certain types of gallstones. TREATMENT Treatment may include:  Fasting for a certain amount of time.  IV fluids.  Medicine to treat pain or vomiting.  Antibiotic medicine.  Surgery to remove your gallbladder (cholecystectomy). This may happen immediately or at a later time. HOME CARE INSTRUCTIONS Home care will depend on your treatment. In general:  Take over-the-counter and prescription medicines only as told by your health care provider.  If you were prescribed an antibiotic medicine, take it as told by your health care provider. Do not stop taking the antibiotic even if you start to feel better.  Follow instructions from your health care provider about what to eat or drink. When you are allowed to eat, avoid eating or drinking anything that triggers your symptoms.  Keep all follow-up visits as told by your health care provider. This is important. SEEK MEDICAL CARE IF:  Your pain is not controlled with medicine.  You have a fever. SEEK IMMEDIATE MEDICAL CARE IF:  Your pain moves to another part of your abdomen or to your back.  You continue to have symptoms or you develop new symptoms even with treatment.   This information  is not intended to replace advice given to you by your health care provider. Make sure you discuss any questions you have with your health care provider.   Document Released: 06/16/2005 Document Revised: 03/07/2015 Document Reviewed: 09/27/2014 Elsevier Interactive Patient Education 2016 Else[employer] Inc.   Please call [facility].  Do this tomorrow morning. They should be following up in the office on Thursday. Larry Cox has chronic cholecystitis which is improving from previously. If his belly pain gets much worse she should probably be reevaluated again. 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 This will continue to cause pain for him. Please use Tylenol as needed for pain for him. Return to the emergency room for any worse pain especially abdominal pain fever vomiting or fevers acting sick.

## 2015-10-29 NOTE — ED Notes (Signed)
Called CT to alert them patient has completed his oral contrast.

## 2015-10-29 NOTE — ED Notes (Signed)
Contacted daughter Chales AbrahamsMary Ann to apprise her of her father's situation.  She was very appreciative of the care our staff provided.

## 2015-10-29 NOTE — ED Notes (Signed)
Patient getting slightly agitated, asking if he can go home tonight.  His daughter who was here left to go get some dinner.

## 2015-10-29 NOTE — ED Notes (Signed)
MD at bedside. 

## 2015-10-29 NOTE — ED Provider Notes (Signed)
[facility] Emergency Department Provider Note   ____________________________________________  Time seen: Approximately 5:30 PM  I have reviewed the triage vital signs and the nursing notes.   HISTORY  Chief Complaint Abdominal Pain and Behavior Problem   HPI Larry Cox is a 82 y.o. male patient comes in complaining of abdominal pain and neck ache. Patient reports he's had the abdominal pain for some time. Patient reports he said he was suicidal  "to speed things up". Patient has been to the emergency room twice for abdominal pain the first time was diagnosed with diverticulitis. Patient complains of being very forgetful. He says he was in [place]" we gave him Haldol" but cannot remember where he was. Patient says at present the belly pain is very bad at all.   Past Medical History  Diagnosis Date  . Hyponatremia   . Small bowel obstruction (HCC)   . Hearing deficit   . Arthritis   . Chronic back pain   . 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  . Anemia   . Dizziness   . Diverticulitis of colon 11/14/2011  . GERD (gastroesophageal reflux disease)   . Hernia     hiatal and inguinal  . Chronic abdominal pain   . Chronic nausea   . Memory deficits   . Alzheimer's dementia   . Thyroid disease     Patient Active Problem List   Diagnosis Date Noted  . Acute diverticulitis 10/05/2015  . Dementia 12/11/2012  . Atypical chest pain 12/11/2012  . Abdominal pain, chronic, left lower quadrant 12/11/2012  . Bilateral inguinal hernia 12/11/2012  . Acute encephalopathy 11/09/2012  . Unspecified hypothyroidism 11/09/2012  . Aphasia 11/09/2012  . Muscle cramps 11/09/2012  . Nausea with vomiting 11/09/2012  . Hypokalemia 04/29/2012  . Chronic abdominal pain 03/31/2012  . GERD (gastroesophageal reflux disease) 03/31/2012  . 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 Elevated BP 11/15/2011  . Sundowning 11/15/2011  . Metabolic  acidosis 11/14/2011  . Diverticulitis of colon 11/14/2011  . Anemia 11/14/2011  . Dizziness 11/14/2011  . SBO (small bowel obstruction) (HCC) 05/09/2011  . Hyponatremia 05/09/2011    Past Surgical History  Procedure Laterality Date  . Colonoscopy  04/08/2012    RMR: Pancolonic diverticulosis    Current Outpatient Rx  Name  Route  Sig  Dispense  Refill  . acetaminophen (TYLENOL) 500 MG tablet   Oral   Take 500 mg by mouth every 6 (six) hours as needed for mild pain, fever or headache.         . diphenhydrAMINE (BENADRYL) 25 mg capsule   Oral   Take 25 mg by mouth every 6 (six) hours as needed for itching.         . famotidine (PEPCID) 20 MG tablet   Oral   Take 20 mg by mouth at bedtime.         Marland Kitchen. HYDROcodone-acetaminophen (NORCO/VICODIN) 5-325 MG tablet   Oral   Take 1 tablet by mouth every 6 (six) hours as needed for moderate pain.         Marland Kitchen. levothyroxine (SYNTHROID, LEVOTHROID) 75 MCG tablet   Oral   Take 75 mcg by mouth daily before breakfast.         . omeprazole (PRILOSEC) 20 MG capsule   Oral   Take 20 mg by mouth daily before breakfast.          . ondansetron (ZOFRAN) 4 MG tablet  Oral   Take 4 mg by mouth every 4 (four) hours as needed for nausea or vomiting.         . polyethylene glycol (MIRALAX / GLYCOLAX) packet   Oral   Take 17 g by mouth daily as needed for mild constipation.          . Psyllium (REGULOID) 28.3 % POWD   Oral   Take 1 scoop by mouth as needed (for constipation).          . triamcinolone cream (KENALOG) 0.1 %   Topical   Apply 1 application topically 2 (two) times daily as needed (for itching).          . ciprofloxacin (CIPRO) 500 MG tablet   Oral   Take 1 tablet (500 mg total) by mouth 2 (two) times daily. Patient not taking: Reported on 10/29/2015   14 tablet   0   . metroNIDAZOLE (FLAGYL) 500 MG tablet   Oral   Take 1 tablet (500 mg total) by mouth every 8 (eight) hours. Patient not taking: Reported  on 10/29/2015   21 tablet   0   . oxyCODONE (ROXICODONE) 5 MG immediate release tablet   Oral   Take 1 tablet (5 mg total) by mouth every 6 (six) hours as needed for moderate pain. Do not drive while taking this medication. Patient not taking: Reported on 10/29/2015   12 tablet   0     Allergies Aricept; Diazepam; Doxycycline; and Lorazepam  Family History  Problem Relation Age of Onset  . Colon cancer Neg Hx   . Cancer Mother     stomach?    Social History Social History  Substance Use Topics  . Smoking status: Former Smoker -- 0.30 packs/day    Types: Cigarettes  . Smokeless tobacco: Former Neurosurgeon    Quit date: 07/29/1958  . Alcohol Use: No     Comment: 1 beer occasionally    Review of Systems Constitutional: No fever/chills Eyes: No visual changes. ENT: No sore throat. Cardiovascular: Denies chest pain. Respiratory: Denies shortness of breath. Gastrointestinal: No abdominal pain.  No nausea, no vomiting.  No diarrhea.  No constipation. Genitourinary: Negative for dysuria. Musculoskeletal: Negative for back pain. Skin: Negative for rash.  10-point ROS otherwise negative.  ____________________________________________   PHYSICAL EXAM:  VITAL SIGNS: ED Triage Vitals  Enc Vitals Group     BP 10/29/15 1702 127/73 mmHg     Pulse Rate 10/29/15 1702 71     Resp 10/29/15 1702 20     Temp 10/29/15 1702 98.3 F (36.8 C)     Temp Source 10/29/15 1702 Oral     SpO2 10/29/15 1702 98 %     Weight 10/29/15 1702 160 lb (72.576 kg)     Height 10/29/15 1702 5\' 8"  (1.727 m)     Head Cir --      Peak Flow --      Pain Score --      Pain Loc --      Pain Edu? --    Constitutional: Alert and oriented. Well appearing and in no acute distress. Eyes: Conjunctivae are normal. PERRL. EOMI. Head: Atraumatic. Nose: No congestion/rhinnorhea. Mouth/Throat: Mucous membranes are moist.  Oropharynx non-erythematous. Neck: No stridor. Mild cervical spine tenderness to  palpation Cardiovascular: Normal rate, regular rhythm. Grossly normal heart sounds.  Good peripheral circulation. Respiratory: Normal respiratory effort.  No retractions. Lungs CTAB. Gastrointestinal: Soft and mildly diffusely. No distention. No abdominal bruits. No CVA tenderness.  Musculoskeletal: No lower extremity tenderness nor edema.  No joint effusions. Neurologic:  Normal speech and language. No gross focal neurologic deficits are appreciated. No gait instability. Skin:  Skin is warm, dry and intact. No rash noted. Psychiatric: Mood and affect are normal. Speech and behavior are normal.  ____________________________________________   LABS (all labs ordered are listed, but only abnormal results are displayed)  Labs Reviewed  COMPREHENSIVE METABOLIC PANEL - Abnormal; Notable for the following:    Total Protein 6.3 (*)    ALT 14 (*)    All other components within normal limits  URINE DRUG SCREEN, QUALITATIVE ([facility] ONLY) - Abnormal; Notable for the following:    Opiate, Ur Screen POSITIVE (*)    All other components within normal limits  CBC WITH DIFFERENTIAL/PLATELET - Abnormal; Notable for the following:    RBC 3.69 (*)    Hemoglobin 12.0 (*)    HCT 35.1 (*)    RDW 14.9 (*)    All other components within normal limits  URINALYSIS COMPLETEWITH MICROSCOPIC ([facility] ONLY) - Abnormal; Notable for the following:    Color, Urine YELLOW (*)    APPearance CLEAR (*)    Leukocytes, UA TRACE (*)    Squamous Epithelial / LPF 0-5 (*)    All other components within normal limits  ETHANOL  SALICYLATE LEVEL  ACETAMINOPHEN LEVEL  LIPASE, BLOOD  TROPONIN I   ____________________________________________  EKG   ____________________________________________  RADIOLOGY  CLINICAL DATA: 82 year old male with confusion, neck pain, agitation. Initial encounter.  EXAM: CT ANGIOGRAPHY NECK  TECHNIQUE: Multidetector CT imaging of the neck was performed using the standard protocol  during bolus administration of intravenous contrast. Multiplanar CT image reconstructions and MIPs were obtained to evaluate the vascular anatomy. Carotid stenosis measurements (when applicable) are obtained utilizing NASCET criteria, using the distal internal carotid diameter as the denominator.  CONTRAST: 100 mL Isovue 370  COMPARISON: Head CT without contrast 11/09/2012. Intracranial MRA 11/14/2011.  FINDINGS: Skeleton: TMJ degeneration. Widespread severe chronic cervical disc and endplate degeneration. Multilevel degenerative cervical spinal stenosis, could be moderate or severe at the C5-C6 level. Osteopenia. No acute osseous abnormality identified.  Visualized paranasal sinuses and mastoids are clear.  Other neck: Negative lung apices. No superior mediastinal lymphadenopathy. Visible axillary lymph nodes are normal.  Negative thyroid, larynx, pharynx, parapharyngeal spaces, retropharyngeal space, sublingual space, submandibular glands and parotid glands. No cervical lymphadenopathy.  Aortic arch: 4 vessel arch configuration, the left vertebral artery arises directly from the arch. Minimal arch atherosclerosis.  Right carotid system: Mildly tortuous right CCA is otherwise negative. Minimal atherosclerosis at the right carotid bifurcation. Mild atherosclerosis in the distal right ICA bulb. No stenosis. Tortuous cervical right ICA. Visible right ICA siphon is negative.  Left carotid system: Negative left CCA. Mild atherosclerosis at the left carotid bifurcation without stenosis. Dense calcified plaque at the distal left ICA bulb, with subsequent stenosis 55-60 % with respect to the distal vessel. Tortuous cervical left ICA otherwise is without stenosis to the skullbase. Negative visible left ICA siphon.  Vertebral arteries:  No proximal right subclavian artery stenosis despite calcified plaque. Normal right vertebral artery origin. Tortuous right  V1 segment. The right vertebral is mildly non dominant throughout the neck and is tortuous again at the right V3 segment. There is a beaded appearance of the vessel in the V3 segment, but no convincing right vertebral artery dissection. The vessel functionally terminates in the right PICA.  Left vertebral artery arises directly from the arch without stenosis. The left  vertebral artery is mildly dominant throughout. It is tortuous and appears mildly beaded in the V3 segment also. No convincing dissection. Normal left PICA origin. No distal left vertebral artery stenosis identified. Visible basilar artery is patent.  IMPRESSION: 1. Mild for age cervical carotid atherosclerosis. Distal left ICA bulb calcified plaque results in 55-60% stenosis. 2. 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 Otherwise negative vertebral arteries, the left arises directly off the aortic arch. 3. Severe cervical spine degeneration. Multilevel degenerative cervical spinal stenosis, could be moderate or severe at the C5-C6 level.   Electronically Signed  By: Odessa Fleming M.D.  On: 10/29/2015 21:14   CT ABDOMEN AND PELVIS WITH CONTRAST  TECHNIQUE: Multidetector CT imaging of the abdomen and pelvis was performed using the standard protocol following bolus administration of intravenous contrast.  CONTRAST: 100 cc Isovue 370  COMPARISON: 10/11/2015.  FINDINGS: Lower chest: Stable left basilar scarring.  Hepatobiliary: Stable small liver cysts. Minimal diffuse gallbladder wall thickening and enhancement. This is improved.  Pancreas: No mass, inflammatory changes, or other significant abnormality.  Spleen: Within normal limits in size and appearance.  Adrenals/Urinary Tract: Bilateral malrotated, duplex kidneys. Small bladder diverticula. No urinary tract calculi or hydronephrosis. Unremarkable adrenal glands.  Stomach/Bowel: Multiple sigmoid colon  diverticula without evidence of diverticulitis. Normal appearing appendix.  Vascular/Lymphatic: Atheromatous arterial calcifications. No enlarged lymph nodes.  Reproductive: Minimally enlarged prostate gland.  Other: Moderately large left inguinal hernia containing fat and small right inguinal hernia containing fat.  Musculoskeletal: Thoracolumbar scoliosis and extensive degenerative changes.  IMPRESSION: 1. No acute abnormality. 2. Minimal diffuse gallbladder wall thickening and enhancement with improvement, compatible with mild chronic cholecystitis. 3. Extensive sigmoid diverticulosis. 4. Moderately large left inguinal hernia containing fat and small right inguinal hernia containing fat.   Electronically Signed  By: Beckie Salts M.D.  On: 10/29/2015 20:59  ____________________________________________   PROCEDURES   Patient has told the nurses and I repeatedly he is not suicidal. His daughter came in to see him and also he told her she is not suicidal he just wanted to do this to get attention quickly. I have not committed him he is not suicidal I will discharge him with follow-up with [facility] surgical S he has chronic cholecystitis. I discussed his case with the surgeon on call who also reviewed his ____________________________________________   INITIAL IMPRESSION / ASSESSMENT AND PLAN / ED COURSE  Pertinent labs & imaging results that were available during my care of the patient were reviewed by me and considered in my medical decision making (see chart for details).   ____________________________________________   FINAL CLINICAL IMPRESSION(S) / ED DIAGNOSES  Final diagnoses:  Chronic cholecystitis  Neck pain      NEW MEDICATIONS STARTED DURING THIS VISIT:  New Prescriptions   No medications on file     Note:  This document was prepared using Dragon voice recognition software and may include unintentional dictation errors.    Arnaldo Natal,  MD 10/29/15 2130

## 2015-10-29 NOTE — ED Notes (Signed)
Pt drinking po contrast

## 2015-10-29 NOTE — ED Notes (Signed)
Pt brought in via ems from [facility].  Ems reports pt has abd pain and neck pain. Pt also reports thoughts of hurting himself today.  Pt hard of hearing and confused.

## 2015-11-01 ENCOUNTER — Encounter: Payer: Self-pay | Admitting: Emergency Medicine

## 2015-11-01 ENCOUNTER — Emergency Department
Admission: EM | Admit: 2015-11-01 | Discharge: 2015-11-01 | Disposition: A | Discharge status: Home / Self Care | Payer: Medicare Other | Attending: Emergency Medicine | Admitting: Emergency Medicine

## 2015-11-01 DIAGNOSIS — Z79899 Other long term (current) drug therapy: Secondary | ICD-10-CM | POA: Diagnosis not present

## 2015-11-01 DIAGNOSIS — R1030 Lower abdominal pain, unspecified: Secondary | ICD-10-CM

## 2015-11-01 DIAGNOSIS — M1990 Unspecified osteoarthritis, unspecified site: Secondary | ICD-10-CM | POA: Diagnosis not present

## 2015-11-01 DIAGNOSIS — Z87891 Personal history of nicotine dependence: Secondary | ICD-10-CM | POA: Diagnosis not present

## 2015-11-01 DIAGNOSIS — E039 Hypothyroidism, unspecified: Secondary | ICD-10-CM | POA: Insufficient documentation

## 2015-11-01 DIAGNOSIS — R109 Unspecified abdominal pain: Secondary | ICD-10-CM | POA: Diagnosis not present

## 2015-11-01 DIAGNOSIS — Z8679 Personal history of other diseases of the circulatory system: Secondary | ICD-10-CM | POA: Insufficient documentation

## 2015-11-01 LAB — URINALYSIS COMPLETE WITH MICROSCOPIC (ARMC ONLY)
Bacteria, UA: NONE SEEN
Bilirubin Urine: NEGATIVE
Glucose, UA: NEGATIVE mg/dL
Hgb urine dipstick: NEGATIVE
Ketones, ur: NEGATIVE mg/dL
Nitrite: NEGATIVE
PH: 7 (ref 5.0–8.0)
PROTEIN: NEGATIVE mg/dL
SPECIFIC GRAVITY, URINE: 1.006 (ref 1.005–1.030)
SQUAMOUS EPITHELIAL / LPF: NONE SEEN

## 2015-11-01 NOTE — Discharge Instructions (Signed)
Monitor for regular bowel movements. If no bowel movement for 2 days, please give miralax (as directed on over the counter packaging) twice daily if constipation.   You were seen in the emergency room for abdominal pain. It is important that you follow up closely with your primary care doctor in the next couple of days.  If you're unable to see her primary care doctor you may return to the emergency room or go to the [facility] in 1 or 2 days for reexam.  Please return to the emergency room right away if you are to develop a fever, severe nausea, your pain becomes severe or worsens, you are unable to keep food down, begin vomiting any dark or bloody fluid, you develop any dark or bloody stools, feel dehydrated, or other new concerns or symptoms arise.   Abdominal Pain, Adult Many things can cause abdominal pain. Usually, abdominal pain is not caused by a disease and will improve without treatment. It can often be observed and treated at home. Your health care provider will do a physical exam and possibly order blood tests and X-rays to help determine the seriousness of your pain. However, in many cases, more time must pass before a clear cause of the pain can be found. Before that point, your health care provider may not know if you need more testing or further treatment. HOME CARE INSTRUCTIONS Monitor your abdominal pain for any changes. The following actions may help to alleviate any discomfort you are experiencing:  Only take over-the-counter or prescription medicines as directed by your health care provider.  Do not take laxatives unless directed to do so by your health care provider.  Try a clear liquid diet (broth, tea, or water) as directed by your health care provider. Slowly move to a bland diet as tolerated. SEEK MEDICAL CARE IF:  You have unexplained abdominal pain.  You have abdominal pain associated with nausea or diarrhea.  You have pain when you urinate or have a  bowel movement.  You experience abdominal pain that wakes you in the night.  You have abdominal pain that is worsened or improved by eating food.  You have abdominal pain that is worsened with eating fatty foods.  You have a fever. SEEK IMMEDIATE MEDICAL CARE IF:  Your pain does not go away within 2 hours.  You keep throwing up (vomiting).  Your pain is felt only in portions of the abdomen, such as the right side or the left lower portion of the abdomen.  You pass bloody or black tarry stools. MAKE SURE YOU:  Understand these instructions.  Will watch your condition.  Will get help right away if you are not doing well or get worse.   This information is not intended to replace advice given to you by your health care provider. Make sure you discuss any questions you have with your health care provider.   Document Released: 03/26/2005 Document Revised: 03/07/2015 Document Reviewed: 02/23/2013 Elsevier Interactive Patient Education [employer]2016 [employer]

## 2015-11-01 NOTE — ED Notes (Signed)
Pt ambulatory, when placed in room pt used bathroom urinating and he states he had a BM, daughter with pt

## 2015-11-01 NOTE — ED Provider Notes (Signed)
[facility] Emergency Department Provider Note  ____________________________________________  Time seen: Approximately 2:23 PM  I have reviewed the triage vital signs and the nursing notes.   HISTORY  Chief Complaint Urinary Retention and Constipation  History per patient's daughter, patient has severe dementia. EM caveat. Severe dementia.  HPI Larry Cox is a 82 y.o. male who presents for evaluation of abdominal pain   Patient's daughter saw him today and he was complaining of abdominal pain, he was doubled over at about 11:00 this morning. She brought him for evaluation. While he was here the patient use the bathroom, unsure if he stooled or urinated but nonetheless after doing so all of his pain and symptoms resolved.  The patient's only complaint right now is that of neck pain, for which his daughter states that he complains of this same pain for years and no new change. No change in his behavior. No fevers or chills. He does have a history of diverticulitis, but has not had a fever and is eating and drinking well.  Patient denies any pain or discomfort. No nausea.  Past Medical History  Diagnosis Date  . Hyponatremia   . Small bowel obstruction (HCC)   . Hearing deficit   . Arthritis   . Chronic back pain   . 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  . Anemia   . Dizziness   . Diverticulitis of colon 11/14/2011  . GERD (gastroesophageal reflux disease)   . Hernia     hiatal and inguinal  . Chronic abdominal pain   . Chronic nausea   . Memory deficits   . Alzheimer's dementia   . Thyroid disease     Patient Active Problem List   Diagnosis Date Noted  . Acute diverticulitis 10/05/2015  . Dementia 12/11/2012  . Atypical chest pain 12/11/2012  . Abdominal pain, chronic, left lower quadrant 12/11/2012  . Bilateral inguinal hernia 12/11/2012  . Acute encephalopathy 11/09/2012  . Unspecified hypothyroidism 11/09/2012  .  Aphasia 11/09/2012  . Muscle cramps 11/09/2012  . Nausea with vomiting 11/09/2012  . Hypokalemia 04/29/2012  . Chronic abdominal pain 03/31/2012  . GERD (gastroesophageal reflux disease) 03/31/2012  . 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 Elevated BP 11/15/2011  . Sundowning 11/15/2011  . Metabolic acidosis 11/14/2011  . Diverticulitis of colon 11/14/2011  . Anemia 11/14/2011  . Dizziness 11/14/2011  . SBO (small bowel obstruction) (HCC) 05/09/2011  . Hyponatremia 05/09/2011    Past Surgical History  Procedure Laterality Date  . Colonoscopy  04/08/2012    RMR: Pancolonic diverticulosis    Current Outpatient Rx  Name  Route  Sig  Dispense  Refill  . acetaminophen (TYLENOL) 500 MG tablet   Oral   Take 500 mg by mouth every 6 (six) hours as needed for mild pain, fever or headache.         . ciprofloxacin (CIPRO) 500 MG tablet   Oral   Take 1 tablet (500 mg total) by mouth 2 (two) times daily. Patient not taking: Reported on 10/29/2015   14 tablet   0   . diphenhydrAMINE (BENADRYL) 25 mg capsule   Oral   Take 25 mg by mouth every 6 (six) hours as needed for itching.         . famotidine (PEPCID) 20 MG tablet   Oral   Take 20 mg by mouth at bedtime.         Marland Kitchen. HYDROcodone-acetaminophen (NORCO/VICODIN) 5-325 MG tablet  Oral   Take 1 tablet by mouth every 6 (six) hours as needed for moderate pain.         Marland Kitchen levothyroxine (SYNTHROID, LEVOTHROID) 75 MCG tablet   Oral   Take 75 mcg by mouth daily before breakfast.         . metroNIDAZOLE (FLAGYL) 500 MG tablet   Oral   Take 1 tablet (500 mg total) by mouth every 8 (eight) hours. Patient not taking: Reported on 10/29/2015   21 tablet   0   . omeprazole (PRILOSEC) 20 MG capsule   Oral   Take 20 mg by mouth daily before breakfast.          . ondansetron (ZOFRAN) 4 MG tablet   Oral   Take 4 mg by mouth every 4 (four) hours as needed for nausea or vomiting.         Marland Kitchen oxyCODONE (ROXICODONE) 5 MG  immediate release tablet   Oral   Take 1 tablet (5 mg total) by mouth every 6 (six) hours as needed for moderate pain. Do not drive while taking this medication. Patient not taking: Reported on 10/29/2015   12 tablet   0   . polyethylene glycol (MIRALAX / GLYCOLAX) packet   Oral   Take 17 g by mouth daily as needed for mild constipation.          . Psyllium (REGULOID) 28.3 % POWD   Oral   Take 1 scoop by mouth as needed (for constipation).          . triamcinolone cream (KENALOG) 0.1 %   Topical   Apply 1 application topically 2 (two) times daily as needed (for itching).            Allergies Aricept; Diazepam; Doxycycline; and Lorazepam  Family History  Problem Relation Age of Onset  . Colon cancer Neg Hx   . Cancer Mother     stomach?    Social History Social History  Substance Use Topics  . Smoking status: Former Smoker -- 0.30 packs/day    Types: Cigarettes  . Smokeless tobacco: Former Neurosurgeon    Quit date: 07/29/1958  . Alcohol Use: No     Comment: 1 beer occasionally    Review of Systems Constitutional: No fever/chills Eyes: No visual changes. ENT: No sore throat. Cardiovascular: Denies chest pain. Respiratory: Denies shortness of breath. Gastrointestinal:  No nausea, no vomiting.  No diarrhea.  He thinks he may be constipated intermittently, possibly today but notes really sure. Genitourinary: Negative for dysuria. Musculoskeletal: Negative for back pain. Skin: Negative for rash. Neurological: Negative for headaches, focal weakness or numbness.  10-point ROS otherwise negative.  ____________________________________________   PHYSICAL EXAM:  VITAL SIGNS: ED Triage Vitals  Enc Vitals Group     BP 11/01/15 1239 108/72 mmHg     Pulse Rate 11/01/15 1239 80     Resp 11/01/15 1239 18     Temp 11/01/15 1239 98.6 F (37 C)     Temp Source 11/01/15 1239 Oral     SpO2 11/01/15 1239 99 %     Weight 11/01/15 1239 160 lb (72.576 kg)     Height 11/01/15  1239 5\' 7"  (1.702 m)     Head Cir --      Peak Flow --      Pain Score --      Pain Loc --      Pain Edu? --      Excl. in GC? --  Constitutional: Alert and orientedTo self and daughter but not to year or time. Well appearing and in no acute distress. Eyes: Conjunctivae are normal. PERRL. EOMI. Head: Atraumatic. Nose: No congestion/rhinnorhea. Mouth/Throat: Mucous membranes are moist.  Oropharynx non-erythematous. Neck: No stridor.  Patient's neck is fairly immobile. He does complain of neck pain in the lower neck, evidently this is quite chronic and unchanged. Cardiovascular: Normal rate, regular rhythm. Grossly normal heart sounds.  Good peripheral circulation. Respiratory: Normal respiratory effort.  No retractions. Lungs CTAB. Gastrointestinal: Soft and nontender. No distention. No rebound or guarding. No peritonitis. No CVA tenderness. Very normal normal abdominal exam. Patient refused examination of genital region, states he is not having a problem there that he does not need that evaluated. (Discussed with daughter is in agreement to defer that )Musculoskeletal: No lower extremity tenderness nor edema.  No joint effusions. Neurologic:  Normal speech and language. No gross focal neurologic deficits are appreciated. No gait instability, does walk with slightly kyphotic stance. Skin:  Skin is warm, dry and intact. No rash noted. Psychiatric: Slightly elevated mood, normal speech. ____________________________________________   LABS (all labs ordered are listed, but only abnormal results are displayed)  Labs Reviewed  URINALYSIS COMPLETEWITH MICROSCOPIC ([facility] ONLY) - Abnormal; Notable for the following:    Color, Urine YELLOW (*)    APPearance CLEAR (*)    Leukocytes, UA TRACE (*)    All other components within normal limits    ____________________________________________  EKG   ____________________________________________  RADIOLOGY   ____________________________________________   PROCEDURES  Procedure(s) performed: None  Critical Care performed: No  ____________________________________________   INITIAL IMPRESSION / ASSESSMENT AND PLAN / ED COURSE  Pertinent labs & imaging results that were available during my care of the patient were reviewed by me and considered in my medical decision making (see chart for details).  Patient presents for abdominal pain. Now resolved. Completely reassuring exam. Postvoid residual less than 100 mL's. The patient either had a bowel movement or urinated and all symptoms resolved thereafter, unclear which.  Given his symptoms resolution, stable vital signs, and very reassuring exam and do not believe there is any additional workup needed at this time. Discussed the patient and his daughter, they will monitor for bowel movements and potentially try MiraLAX as a suspect he may have been constipated.  careful abdominal pain return precautions discussed with the patient's daughter as well as the patient. Patient's daughter agreeable, the patient himself does not really remember even seeing a physician by the time he came back to discuss it (which is normal for him per the daughter). ____________________________________________   FINAL CLINICAL IMPRESSION(S) / ED DIAGNOSES  Final diagnoses:  Abdominal pain, suprapubic, unspecified laterality      Sharyn Creamer, MD 11/01/15 1429

## 2015-11-01 NOTE — ED Notes (Signed)
Patient presents to the ED with urinary retention and constipation.  Patient's daughter states that at 11am patient was doubled over and complaining of pain.  Patient reported feeling like he needs to urinate very badly and so this RN took patient to the restroom.  Patient was able to urinate and passed gas.  Afterward this RN bladder scanned patient and there was about 93ml showing on the bladder scanner.  Patient appears relieved after urinating.  Patient has dementia and confusion at baseline.  Patient is repetitive and smiling and laughing during triage.

## 2015-11-15 DIAGNOSIS — F0281 Dementia in other diseases classified elsewhere with behavioral disturbance: Secondary | ICD-10-CM | POA: Diagnosis not present

## 2015-11-15 DIAGNOSIS — K5700 Diverticulitis of small intestine with perforation and abscess without bleeding: Secondary | ICD-10-CM | POA: Diagnosis not present

## 2015-11-15 DIAGNOSIS — G309 Alzheimer's disease, unspecified: Secondary | ICD-10-CM | POA: Diagnosis not present

## 2015-12-12 ENCOUNTER — Emergency Department (HOSPITAL_COMMUNITY): Payer: Medicare Other

## 2015-12-12 ENCOUNTER — Encounter (HOSPITAL_COMMUNITY): Payer: Self-pay | Admitting: Emergency Medicine

## 2015-12-12 ENCOUNTER — Emergency Department (HOSPITAL_COMMUNITY)
Admission: EM | Admit: 2015-12-12 | Discharge: 2015-12-13 | Disposition: A | Discharge status: Home / Self Care | Payer: Medicare Other | Attending: Emergency Medicine | Admitting: Emergency Medicine

## 2015-12-12 DIAGNOSIS — R079 Chest pain, unspecified: Secondary | ICD-10-CM | POA: Diagnosis not present

## 2015-12-12 DIAGNOSIS — R109 Unspecified abdominal pain: Secondary | ICD-10-CM | POA: Diagnosis not present

## 2015-12-12 DIAGNOSIS — Y929 Unspecified place or not applicable: Secondary | ICD-10-CM | POA: Diagnosis not present

## 2015-12-12 DIAGNOSIS — W010XXA Fall on same level from slipping, tripping and stumbling without subsequent striking against object, initial encounter: Secondary | ICD-10-CM | POA: Insufficient documentation

## 2015-12-12 DIAGNOSIS — S299XXA Unspecified injury of thorax, initial encounter: Secondary | ICD-10-CM | POA: Diagnosis not present

## 2015-12-12 DIAGNOSIS — Y999 Unspecified external cause status: Secondary | ICD-10-CM | POA: Diagnosis not present

## 2015-12-12 DIAGNOSIS — R1012 Left upper quadrant pain: Secondary | ICD-10-CM | POA: Diagnosis not present

## 2015-12-12 DIAGNOSIS — Z87891 Personal history of nicotine dependence: Secondary | ICD-10-CM | POA: Insufficient documentation

## 2015-12-12 DIAGNOSIS — M1990 Unspecified osteoarthritis, unspecified site: Secondary | ICD-10-CM | POA: Diagnosis not present

## 2015-12-12 DIAGNOSIS — S3991XA Unspecified injury of abdomen, initial encounter: Secondary | ICD-10-CM | POA: Diagnosis not present

## 2015-12-12 DIAGNOSIS — W19XXXA Unspecified fall, initial encounter: Secondary | ICD-10-CM

## 2015-12-12 DIAGNOSIS — Y9301 Activity, walking, marching and hiking: Secondary | ICD-10-CM | POA: Insufficient documentation

## 2015-12-12 LAB — I-STAT CHEM 8, ED
BUN: 8 mg/dL (ref 6–20)
CHLORIDE: 102 mmol/L (ref 101–111)
Calcium, Ion: 1.15 mmol/L (ref 1.13–1.30)
Creatinine, Ser: 0.9 mg/dL (ref 0.61–1.24)
GLUCOSE: 105 mg/dL — AB (ref 65–99)
HCT: 34 % — ABNORMAL LOW (ref 39.0–52.0)
Hemoglobin: 11.6 g/dL — ABNORMAL LOW (ref 13.0–17.0)
POTASSIUM: 3.7 mmol/L (ref 3.5–5.1)
Sodium: 138 mmol/L (ref 135–145)
TCO2: 26 mmol/L (ref 0–100)

## 2015-12-12 LAB — URINALYSIS, ROUTINE W REFLEX MICROSCOPIC
BILIRUBIN URINE: NEGATIVE
Glucose, UA: NEGATIVE mg/dL
HGB URINE DIPSTICK: NEGATIVE
Ketones, ur: NEGATIVE mg/dL
Leukocytes, UA: NEGATIVE
Nitrite: NEGATIVE
PH: 6 (ref 5.0–8.0)
Protein, ur: NEGATIVE mg/dL
SPECIFIC GRAVITY, URINE: 1.01 (ref 1.005–1.030)

## 2015-12-12 LAB — CBC WITH DIFFERENTIAL/PLATELET
Basophils Absolute: 0 10*3/uL (ref 0.0–0.1)
Basophils Relative: 0 %
EOS PCT: 3 %
Eosinophils Absolute: 0.1 10*3/uL (ref 0.0–0.7)
HCT: 33 % — ABNORMAL LOW (ref 39.0–52.0)
HEMOGLOBIN: 11.6 g/dL — AB (ref 13.0–17.0)
LYMPHS ABS: 1.3 10*3/uL (ref 0.7–4.0)
LYMPHS PCT: 25 %
MCH: 33.7 pg (ref 26.0–34.0)
MCHC: 35.2 g/dL (ref 30.0–36.0)
MCV: 95.9 fL (ref 78.0–100.0)
Monocytes Absolute: 0.6 10*3/uL (ref 0.1–1.0)
Monocytes Relative: 11 %
NEUTROS PCT: 61 %
Neutro Abs: 3.2 10*3/uL (ref 1.7–7.7)
Platelets: 244 10*3/uL (ref 150–400)
RBC: 3.44 MIL/uL — AB (ref 4.22–5.81)
RDW: 14.4 % (ref 11.5–15.5)
WBC: 5.2 10*3/uL (ref 4.0–10.5)

## 2015-12-12 LAB — COMPREHENSIVE METABOLIC PANEL
ALK PHOS: 58 U/L (ref 38–126)
ALT: 14 U/L — AB (ref 17–63)
AST: 20 U/L (ref 15–41)
Albumin: 4 g/dL (ref 3.5–5.0)
Anion gap: 3 — ABNORMAL LOW (ref 5–15)
BUN: 9 mg/dL (ref 6–20)
CHLORIDE: 105 mmol/L (ref 101–111)
CO2: 26 mmol/L (ref 22–32)
Calcium: 8.6 mg/dL — ABNORMAL LOW (ref 8.9–10.3)
Creatinine, Ser: 0.84 mg/dL (ref 0.61–1.24)
Glucose, Bld: 107 mg/dL — ABNORMAL HIGH (ref 65–99)
POTASSIUM: 3.7 mmol/L (ref 3.5–5.1)
Sodium: 134 mmol/L — ABNORMAL LOW (ref 135–145)
Total Bilirubin: 0.5 mg/dL (ref 0.3–1.2)
Total Protein: 6.5 g/dL (ref 6.5–8.1)

## 2015-12-12 LAB — I-STAT CG4 LACTIC ACID, ED: Lactic Acid, Venous: 1.2 mmol/L (ref 0.5–2.0)

## 2015-12-12 MED ORDER — SODIUM CHLORIDE 0.9 % IV BOLUS (SEPSIS)
1000.0000 mL | Freq: Once | INTRAVENOUS | Status: AC
  Administered 2015-12-12: 1000 mL via INTRAVENOUS

## 2015-12-12 NOTE — Discharge Instructions (Signed)
Follow up with your md next week for recheck °

## 2015-12-12 NOTE — ED Notes (Signed)
Removed IV, Right AC, no issue.

## 2015-12-12 NOTE — ED Notes (Signed)
Assisted pt to side of bed to urinate

## 2015-12-12 NOTE — ED Notes (Signed)
Pt had unwitnessed fall at [facility] and c/o left side pain and knee pain.

## 2015-12-12 NOTE — ED Provider Notes (Signed)
CSN: [id number]     Arrival date & time 12/12/15  1954 History  By signing my name below, I, Vista Minkobert Ross, attest that this documentation has been prepared under the direction and in the presence of Bethann BerkshireJoseph Kandra Graven, MD. Electronically signed, Vista Minkobert Ross, ED Scribe. 12/12/2015. 8:10 PM.    Chief Complaint  Patient presents with  . Fall   Patient is a 82 y.o. male presenting with fall. The history is provided by the patient, a relative and the EMS personnel.  Fall This is a new problem. The current episode started less than 1 hour ago. The problem occurs constantly. The problem has not changed since onset.Associated symptoms include abdominal pain (left sided). Pertinent negatives include no chest pain and no headaches.   HPI Comments: Sandrea MatteCharles G Joffe is a 82 y.o. male with a PMHx of Diverticulitis and chronic back pain, brought in by ambulance, who presents to the Emergency Department complaining of constant left sided abdominal pain s/p a fall that occurred less than one hour ago. [facility] he went to the bathroom and fell on his left side onto a cement floor.  Pt's son states that he was seen by Dr. Ouida SillsFagan one month ago for a physical and was in good health.  Past Medical History  Diagnosis Date  . Hyponatremia   . Small bowel obstruction (HCC)   . Hearing deficit   . Arthritis   . Chronic back pain   . 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  . Anemia   . Dizziness   . Diverticulitis of colon 11/14/2011  . GERD (gastroesophageal reflux disease)   . Hernia     hiatal and inguinal  . Chronic abdominal pain   . Chronic nausea   . Memory deficits   . Alzheimer's dementia   . Thyroid disease    Past Surgical History  Procedure Laterality Date  . Colonoscopy  04/08/2012    RMR: Pancolonic diverticulosis   Family History  Problem Relation Age of Onset  . Colon cancer Neg Hx   . Cancer Mother     stomach?   Social  History  Substance Use Topics  . Smoking status: Former Smoker -- 0.30 packs/day    Types: Cigarettes  . Smokeless tobacco: Former NeurosurgeonUser    Quit date: 07/29/1958  . Alcohol Use: No     Comment: 1 beer occasionally    Review of Systems  Constitutional: Negative for appetite change and fatigue.  HENT: Negative for congestion, ear discharge and sinus pressure.   Eyes: Negative for discharge.  Respiratory: Negative for cough.   Cardiovascular: Negative for chest pain.  Gastrointestinal: Positive for abdominal pain (left sided). Negative for diarrhea.  Genitourinary: Negative for frequency and hematuria.  Musculoskeletal: Negative for back pain.  Skin: Negative for rash.  Neurological: Negative for seizures and headaches.  Psychiatric/Behavioral: Negative for hallucinations.      Allergies  Aricept; Diazepam; Doxycycline; and Lorazepam  Home Medications   Prior to Admission medications   Medication Sig Start Date End Date Taking? Authorizing Provider  acetaminophen (TYLENOL) 500 MG tablet Take 500 mg by mouth every 6 (six) hours as needed for mild pain, fever or headache.    Historical Provider, MD  ciprofloxacin (CIPRO) 500 MG tablet Take 1 tablet (500 mg total) by mouth 2 (two) times daily. Patient not taking: Reported on 10/29/2015 10/07/15   Milagros LollSrikar Sudini, MD  diphenhydrAMINE (BENADRYL) 25 mg capsule  Take 25 mg by mouth every 6 (six) hours as needed for itching.    Historical Provider, MD  famotidine (PEPCID) 20 MG tablet Take 20 mg by mouth at bedtime.    Historical Provider, MD  HYDROcodone-acetaminophen (NORCO/VICODIN) 5-325 MG tablet Take 1 tablet by mouth every 6 (six) hours as needed for moderate pain.    Historical Provider, MD  levothyroxine (SYNTHROID, LEVOTHROID) 75 MCG tablet Take 75 mcg by mouth daily before breakfast.    Historical Provider, MD  metroNIDAZOLE (FLAGYL) 500 MG tablet Take 1 tablet (500 mg total) by mouth every 8 (eight) hours. Patient not taking:  Reported on 10/29/2015 10/07/15   Milagros Loll, MD  omeprazole (PRILOSEC) 20 MG capsule Take 20 mg by mouth daily before breakfast.     Historical Provider, MD  ondansetron (ZOFRAN) 4 MG tablet Take 4 mg by mouth every 4 (four) hours as needed for nausea or vomiting.    Historical Provider, MD  oxyCODONE (ROXICODONE) 5 MG immediate release tablet Take 1 tablet (5 mg total) by mouth every 6 (six) hours as needed for moderate pain. Do not drive while taking this medication. Patient not taking: Reported on 10/29/2015 10/11/15   Gayla Doss, MD  polyethylene glycol (MIRALAX / GLYCOLAX) packet Take 17 g by mouth daily as needed for mild constipation.     Historical Provider, MD  Psyllium (REGULOID) 28.3 % POWD Take 1 scoop by mouth as needed (for constipation).     Historical Provider, MD  triamcinolone cream (KENALOG) 0.1 % Apply 1 application topically 2 (two) times daily as needed (for itching).     Historical Provider, MD   BP 92/56 mmHg  Pulse 77  Temp(Src) 97.8 F (36.6 C) (Oral)  Resp 16  SpO2 98% Physical Exam  Constitutional: He is oriented to person, place, and time. He appears well-developed.  HENT:  Head: Normocephalic.  Eyes: Conjunctivae and EOM are normal. No scleral icterus.  Neck: Neck supple. No thyromegaly present.  Cardiovascular: Normal rate and regular rhythm.  Exam reveals no gallop and no friction rub.   No murmur heard. Pulmonary/Chest: No stridor. He has no wheezes. He has no rales. He exhibits no tenderness.  Abdominal: He exhibits no distension. There is tenderness. There is no rebound.  Tender left lateral rib and left upper quadrant.  Musculoskeletal: Normal range of motion. He exhibits edema. He exhibits no tenderness.  2+ edema in ankles  Lymphadenopathy:    He has no cervical adenopathy.  Neurological: He is oriented to person, place, and time. He exhibits normal muscle tone. Coordination normal.  Skin: No rash noted. No erythema.  Psychiatric: He has a normal  mood and affect. His behavior is normal.  Nursing note and vitals reviewed.   ED Course  Procedures  DIAGNOSTIC STUDIES: Oxygen Saturation is 98% on RA, normal by my interpretation.  COORDINATION OF CARE: 8:11 PM-Will order blood work and imaging. Discussed treatment plan with pt at bedside and pt agreed to plan.   Labs Review Labs Reviewed - No data to display  Imaging Review No results found. I have personally reviewed and evaluated these images and lab results as part of my medical decision-making.   EKG Interpretation None      MDM   Final diagnoses:  None   Patient states that he tripped on his walking and bathroom unable to get up. Patient states he did not hit his head. Patient complains of chest and abdomen discomfort. CT scan unremarkable labs unremarkable patient is  being discharged and will follow-up with his PCP next week  The chart was scribed for me under my direct supervision.  I personally performed the history, physical, and medical decision making and all procedures in the evaluation of this patient.Bethann Berkshire, MD 12/12/15 [phone]

## 2015-12-26 ENCOUNTER — Encounter (HOSPITAL_COMMUNITY): Payer: Self-pay | Admitting: Emergency Medicine

## 2015-12-26 ENCOUNTER — Emergency Department (HOSPITAL_COMMUNITY)
Admission: EM | Admit: 2015-12-26 | Discharge: 2015-12-26 | Disposition: A | Discharge status: Home / Self Care | Payer: Medicare Other | Attending: Emergency Medicine | Admitting: Emergency Medicine

## 2015-12-26 ENCOUNTER — Emergency Department (HOSPITAL_COMMUNITY): Payer: Medicare Other

## 2015-12-26 DIAGNOSIS — F0390 Unspecified dementia without behavioral disturbance: Secondary | ICD-10-CM | POA: Insufficient documentation

## 2015-12-26 DIAGNOSIS — Z8679 Personal history of other diseases of the circulatory system: Secondary | ICD-10-CM | POA: Diagnosis not present

## 2015-12-26 DIAGNOSIS — M542 Cervicalgia: Secondary | ICD-10-CM | POA: Diagnosis not present

## 2015-12-26 DIAGNOSIS — Z79899 Other long term (current) drug therapy: Secondary | ICD-10-CM | POA: Insufficient documentation

## 2015-12-26 DIAGNOSIS — R918 Other nonspecific abnormal finding of lung field: Secondary | ICD-10-CM | POA: Diagnosis not present

## 2015-12-26 DIAGNOSIS — M1990 Unspecified osteoarthritis, unspecified site: Secondary | ICD-10-CM | POA: Insufficient documentation

## 2015-12-26 DIAGNOSIS — R10812 Left upper quadrant abdominal tenderness: Secondary | ICD-10-CM | POA: Diagnosis not present

## 2015-12-26 DIAGNOSIS — R451 Restlessness and agitation: Secondary | ICD-10-CM | POA: Diagnosis present

## 2015-12-26 DIAGNOSIS — Z87891 Personal history of nicotine dependence: Secondary | ICD-10-CM | POA: Diagnosis not present

## 2015-12-26 DIAGNOSIS — R9082 White matter disease, unspecified: Secondary | ICD-10-CM | POA: Diagnosis not present

## 2015-12-26 DIAGNOSIS — K2970 Gastritis, unspecified, without bleeding: Secondary | ICD-10-CM | POA: Diagnosis not present

## 2015-12-26 LAB — URINALYSIS, ROUTINE W REFLEX MICROSCOPIC
Bilirubin Urine: NEGATIVE
GLUCOSE, UA: NEGATIVE mg/dL
HGB URINE DIPSTICK: NEGATIVE
Ketones, ur: NEGATIVE mg/dL
Leukocytes, UA: NEGATIVE
Nitrite: NEGATIVE
PH: 6.5 (ref 5.0–8.0)
PROTEIN: NEGATIVE mg/dL

## 2015-12-26 LAB — RAPID URINE DRUG SCREEN, HOSP PERFORMED
AMPHETAMINES: NOT DETECTED
BARBITURATES: NOT DETECTED
Benzodiazepines: NOT DETECTED
Cocaine: NOT DETECTED
Opiates: NOT DETECTED
TETRAHYDROCANNABINOL: NOT DETECTED

## 2015-12-26 LAB — TROPONIN I

## 2015-12-26 LAB — CBC WITH DIFFERENTIAL/PLATELET
BASOS ABS: 0 10*3/uL (ref 0.0–0.1)
BASOS PCT: 0 %
Eosinophils Absolute: 0.1 10*3/uL (ref 0.0–0.7)
Eosinophils Relative: 3 %
HEMATOCRIT: 35.4 % — AB (ref 39.0–52.0)
HEMOGLOBIN: 12.2 g/dL — AB (ref 13.0–17.0)
Lymphocytes Relative: 23 %
Lymphs Abs: 1.3 10*3/uL (ref 0.7–4.0)
MCH: 33.2 pg (ref 26.0–34.0)
MCHC: 34.5 g/dL (ref 30.0–36.0)
MCV: 96.5 fL (ref 78.0–100.0)
MONO ABS: 0.5 10*3/uL (ref 0.1–1.0)
Monocytes Relative: 8 %
NEUTROS ABS: 3.6 10*3/uL (ref 1.7–7.7)
NEUTROS PCT: 66 %
Platelets: 240 10*3/uL (ref 150–400)
RBC: 3.67 MIL/uL — ABNORMAL LOW (ref 4.22–5.81)
RDW: 14.4 % (ref 11.5–15.5)
WBC: 5.5 10*3/uL (ref 4.0–10.5)

## 2015-12-26 LAB — COMPREHENSIVE METABOLIC PANEL
ALBUMIN: 3.9 g/dL (ref 3.5–5.0)
ALT: 15 U/L — ABNORMAL LOW (ref 17–63)
AST: 18 U/L (ref 15–41)
Alkaline Phosphatase: 69 U/L (ref 38–126)
Anion gap: 6 (ref 5–15)
BILIRUBIN TOTAL: 0.5 mg/dL (ref 0.3–1.2)
BUN: 11 mg/dL (ref 6–20)
CO2: 26 mmol/L (ref 22–32)
Calcium: 8.7 mg/dL — ABNORMAL LOW (ref 8.9–10.3)
Chloride: 106 mmol/L (ref 101–111)
Creatinine, Ser: 0.91 mg/dL (ref 0.61–1.24)
GFR calc Af Amer: >60 mL/min (ref >60)
GFR calc non Af Amer: >60 mL/min (ref >60)
GLUCOSE: 92 mg/dL (ref 65–99)
POTASSIUM: 4 mmol/L (ref 3.5–5.1)
SODIUM: 138 mmol/L (ref 135–145)
TOTAL PROTEIN: 6.7 g/dL (ref 6.5–8.1)

## 2015-12-26 NOTE — Discharge Instructions (Signed)
Take your usual prescriptions as previously directed.  Call your regular medical doctor today to schedule a follow up appointment within the next 2 days.  Return to the Emergency Department immediately sooner if worsening.  ° °

## 2015-12-26 NOTE — ED Notes (Signed)
Pt refused vital signs.

## 2015-12-26 NOTE — ED Provider Notes (Signed)
CSN: [id number]     Arrival date & time 12/26/15  1112 History   First MD Initiated Contact with Patient 12/26/15 1219     Chief Complaint  Patient presents with  . Agitation      The history is provided by the patient, the EMS personnel and the nursing home. The history is limited by the condition of the patient (Hx dementia).  Pt was seen at 1215. Per EMS, NH report and pt: NH staff states pt was "agitated with them" this morning, so they sent him to the ED for evaluation. Pt has significant hx of dementia and currently denies any complaints.    Past Medical History  Diagnosis Date  . Hyponatremia   . Small bowel obstruction (HCC)   . Hearing deficit   . Arthritis   . Chronic back pain   . 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  . Anemia   . Dizziness   . Diverticulitis of colon 11/14/2011  . GERD (gastroesophageal reflux disease)   . Hernia     hiatal and inguinal  . Chronic abdominal pain   . Chronic nausea   . Memory deficits   . Alzheimer's dementia   . Thyroid disease    Past Surgical History  Procedure Laterality Date  . Colonoscopy  04/08/2012    RMR: Pancolonic diverticulosis   Family History  Problem Relation Age of Onset  . Colon cancer Neg Hx   . Cancer Mother     stomach?   Social History  Substance Use Topics  . Smoking status: Former Smoker -- 0.30 packs/day    Types: Cigarettes  . Smokeless tobacco: Former Neurosurgeon    Quit date: 07/29/1958  . Alcohol Use: No     Comment: 1 beer occasionally    Review of Systems  Unable to perform ROS: Dementia    Allergies  Aricept; Diazepam; Doxycycline; and Lorazepam  Home Medications   Prior to Admission medications   Medication Sig Start Date End Date Taking? Authorizing Provider  acetaminophen (TYLENOL) 500 MG tablet Take 500 mg by mouth every 6 (six) hours as needed for mild pain, fever or headache.   Yes Historical Provider, MD  diphenhydrAMINE (BENADRYL) 25 MG tablet Take 25 mg by  mouth every 6 (six) hours as needed for itching.   Yes Historical Provider, MD  famotidine (PEPCID) 20 MG tablet Take 20 mg by mouth at bedtime.   Yes Historical Provider, MD  levothyroxine (SYNTHROID, LEVOTHROID) 75 MCG tablet Take 75 mcg by mouth daily before breakfast.   Yes Historical Provider, MD  omeprazole (PRILOSEC) 20 MG capsule Take 20 mg by mouth daily before breakfast.    Yes Historical Provider, MD  ondansetron (ZOFRAN) 4 MG tablet Take 4 mg by mouth every 4 (four) hours as needed for nausea or vomiting.   Yes Historical Provider, MD  polyethylene glycol (MIRALAX / GLYCOLAX) packet Take 17 g by mouth daily as needed for mild constipation.    Yes Historical Provider, MD  ciprofloxacin (CIPRO) 500 MG tablet Take 1 tablet (500 mg total) by mouth 2 (two) times daily. Patient not taking: Reported on 10/29/2015 10/07/15   Milagros Loll, MD  metroNIDAZOLE (FLAGYL) 500 MG tablet Take 1 tablet (500 mg total) by mouth every 8 (eight) hours. Patient not taking: Reported on 10/29/2015 10/07/15   Milagros Loll, MD   BP 141/68 mmHg  Pulse 80  Temp(Src) 97.6 F (36.4 C) (Oral)  Resp 18  Ht  (1.676  m)  Wt 160 lb (72.576 kg)  BMI 25.84 kg/m2  SpO2 91%   12:44 Orthostatic Vital Signs FG  Orthostatic Lying  - BP- Lying: 129/73 mmHg ; Pulse- Lying: 62  Orthostatic Sitting - BP- Sitting: 130/77 mmHg ; Pulse- Sitting: 65  Orthostatic Standing at 0 minutes - BP- Standing at 0 minutes: 136/70 mmHg ; Pulse- Standing at 0 minutes: 70       Physical Exam  1220: Physical examination:  Nursing notes reviewed; Vital signs and O2 SAT reviewed;  Constitutional: Well developed, Well nourished, Well hydrated, In no acute distress; Head:  Normocephalic, atraumatic; Eyes: EOMI, PERRL, No scleral icterus; ENMT: Mouth and pharynx normal, Mucous membranes moist; Neck: Supple, Full range of motion, No lymphadenopathy; Cardiovascular: Regular rate and rhythm, No gallop; Respiratory: Breath sounds clear & equal  bilaterally, No wheezes.  Speaking full sentences with ease, Normal respiratory effort/excursion; Chest: Nontender, Movement normal; Abdomen: Soft, Nontender, Nondistended, Normal bowel sounds; Genitourinary: No CVA tenderness; Spine:  No midline CS, TS, LS tenderness.;; Extremities: Pulses normal, No tenderness, No edema, No calf edema or asymmetry.; Neuro: Awake, alert, confused per hx dementia. +HOH. No facial droop. Speech clear. Grips equal. Strength 5/5 equal bilat UE's and LE's. No gross focal motor or sensory deficits in extremities. Climbs on and off stretcher easily by himself. Gait steady.; Skin: Color normal, Warm, Dry.   ED Course  Procedures (including critical care time) Labs Review  Imaging Review  I have personally reviewed and evaluated these images and lab results as part of my medical decision-making.   EKG Interpretation   Date/Time:  Wednesday December 26 2015 12:41:42 EDT Ventricular Rate:  66 PR Interval:    QRS Duration: 112 QT Interval:  440 QTC Calculation: 461 R Axis:   58 Text Interpretation:  Sinus rhythm Borderline prolonged PR interval  Borderline intraventricular conduction delay Baseline wander When compared  with ECG of 02/18/2013 No significant change was found Confirmed by [facility]   MD, Nicholos Johns [phone]) on 12/26/2015 2:16:43 PM      MDM  MDM Reviewed: previous chart, nursing note and vitals Reviewed previous: labs and ECG Interpretation: labs, ECG, x-ray and CT scan   Results for orders placed or performed during the hospital encounter of 12/26/15  CBC with Differential/Platelet  Result Value Ref Range   WBC 5.5 4.0 - 10.5 K/uL   RBC 3.67 (L) 4.22 - 5.81 MIL/uL   Hemoglobin 12.2 (L) 13.0 - 17.0 g/dL   HCT 19.1 (L) 47.8 - 29.5 %   MCV 96.5 78.0 - 100.0 fL   MCH 33.2 26.0 - 34.0 pg   MCHC 34.5 30.0 - 36.0 g/dL   RDW 62.1 30.8 - 65.7 %   Platelets 240 150 - 400 K/uL   Neutrophils Relative % 66 %   Neutro Abs 3.6 1.7 - 7.7 K/uL   Lymphocytes  Relative 23 %   Lymphs Abs 1.3 0.7 - 4.0 K/uL   Monocytes Relative 8 %   Monocytes Absolute 0.5 0.1 - 1.0 K/uL   Eosinophils Relative 3 %   Eosinophils Absolute 0.1 0.0 - 0.7 K/uL   Basophils Relative 0 %   Basophils Absolute 0.0 0.0 - 0.1 K/uL  Comprehensive metabolic panel  Result Value Ref Range   Sodium 138 135 - 145 mmol/L   Potassium 4.0 3.5 - 5.1 mmol/L   Chloride 106 101 - 111 mmol/L   CO2 26 22 - 32 mmol/L   Glucose, Bld 92 65 - 99 mg/dL   BUN  11 6 - 20 mg/dL   Creatinine, Ser 4.090.91 0.61 - 1.24 mg/dL   Calcium 8.7 (L) 8.9 - 10.3 mg/dL   Total Protein 6.7 6.5 - 8.1 g/dL   Albumin 3.9 3.5 - 5.0 g/dL   AST 18 15 - 41 U/L   ALT 15 (L) 17 - 63 U/L   Alkaline Phosphatase 69 38 - 126 U/L   Total Bilirubin 0.5 0.3 - 1.2 mg/dL   GFR calc non Af Amer >60 >60 mL/min   GFR calc Af Amer >60 >60 mL/min   Anion gap 6 5 - 15  Urinalysis, Routine w reflex microscopic (not at [facility])  Result Value Ref Range   Color, Urine YELLOW YELLOW   APPearance CLEAR CLEAR   Specific Gravity, Urine <1.005 (L) 1.005 - 1.030   pH 6.5 5.0 - 8.0   Glucose, UA NEGATIVE NEGATIVE mg/dL   Hgb urine dipstick NEGATIVE NEGATIVE   Bilirubin Urine NEGATIVE NEGATIVE   Ketones, ur NEGATIVE NEGATIVE mg/dL   Protein, ur NEGATIVE NEGATIVE mg/dL   Nitrite NEGATIVE NEGATIVE   Leukocytes, UA NEGATIVE NEGATIVE  Urine rapid drug screen (hosp performed)  Result Value Ref Range   Opiates NONE DETECTED NONE DETECTED   Cocaine NONE DETECTED NONE DETECTED   Benzodiazepines NONE DETECTED NONE DETECTED   Amphetamines NONE DETECTED NONE DETECTED   Tetrahydrocannabinol NONE DETECTED NONE DETECTED   Barbiturates NONE DETECTED NONE DETECTED  Troponin I  Result Value Ref Range   Troponin I <0.03 <0.03 ng/mL   Dg Chest 2 View 12/26/2015  CLINICAL DATA:  Agitation.  History of dementia. EXAM: CHEST  2 VIEW COMPARISON:  Radiographs and CT 12/12/2015. FINDINGS: The heart size and mediastinal contours are normal. The lungs are  clear. The overall basilar aeration has improved compared with the prior study. There is mild pleural thickening at both apices. There is no pleural effusion or pneumothorax. No acute osseous findings are identified. IMPRESSION: No active cardiopulmonary process. Electronically Signed   By: Carey BullocksWilliam  Veazey M.D.   On: 12/26/2015 13:34    Ct Head Wo Contrast 12/26/2015  CLINICAL DATA:  Neck pain.  Agitation. EXAM: CT HEAD WITHOUT CONTRAST CT CERVICAL SPINE WITHOUT CONTRAST TECHNIQUE: Multidetector CT imaging of the head and cervical spine was performed following the standard protocol without intravenous contrast. Multiplanar CT image reconstructions of the cervical spine were also generated. COMPARISON:  CTA of the neck 10/29/2015.  Head CT 11/09/2012 FINDINGS: CT HEAD FINDINGS There is no evidence for acute hemorrhage, hydrocephalus, mass lesion, or abnormal extra-axial fluid collection. No definite CT evidence for acute infarction. There is no evidence for acute hemorrhage, hydrocephalus, mass lesion, or abnormal extra-axial fluid collection. No definite CT evidence for acute infarction. Diffuse loss of parenchymal volume is consistent with atrophy. Patchy low attenuation in the deep hemispheric and periventricular white matter is nonspecific, but likely reflects chronic microvascular ischemic demyelination. The visualized paranasal sinuses and mastoid air cells are clear. No skull fracture. CT CERVICAL SPINE FINDINGS Imaging was obtained from the skullbase through the T3 vertebral body. No fracture. Loss of disc height is substantial is all levels from C4-5 down to C7-T1 where there is associated endplate degeneration. Facet osteoarthritis is more prominent upper cervical levels. Reversal of the normal cervical lordosis is evident. There is no prevertebral soft tissue edema. Calcified granuloma identified posterior left lung apex. IMPRESSION: 1. No acute intracranial abnormality. Atrophy with chronic small  vessel white matter ischemic demyelination. 2. Diffuse degenerative changes in the cervical spine without acute  bony abnormality. Electronically Signed   By: Kennith CenterEric  Mansell M.D.   On: 12/26/2015 14:34    Ct Cervical Spine Wo Contrast 12/26/2015  CLINICAL DATA:  Neck pain.  Agitation. EXAM: CT HEAD WITHOUT CONTRAST CT CERVICAL SPINE WITHOUT CONTRAST TECHNIQUE: Multidetector CT imaging of the head and cervical spine was performed following the standard protocol without intravenous contrast. Multiplanar CT image reconstructions of the cervical spine were also generated. COMPARISON:  CTA of the neck 10/29/2015.  Head CT 11/09/2012 FINDINGS: CT HEAD FINDINGS There is no evidence for acute hemorrhage, hydrocephalus, mass lesion, or abnormal extra-axial fluid collection. No definite CT evidence for acute infarction. There is no evidence for acute hemorrhage, hydrocephalus, mass lesion, or abnormal extra-axial fluid collection. No definite CT evidence for acute infarction. Diffuse loss of parenchymal volume is consistent with atrophy. Patchy low attenuation in the deep hemispheric and periventricular white matter is nonspecific, but likely reflects chronic microvascular ischemic demyelination. The visualized paranasal sinuses and mastoid air cells are clear. No skull fracture. CT CERVICAL SPINE FINDINGS Imaging was obtained from the skullbase through the T3 vertebral body. No fracture. Loss of disc height is substantial is all levels from C4-5 down to C7-T1 where there is associated endplate degeneration. Facet osteoarthritis is more prominent upper cervical levels. Reversal of the normal cervical lordosis is evident. There is no prevertebral soft tissue edema. Calcified granuloma identified posterior left lung apex. IMPRESSION: 1. No acute intracranial abnormality. Atrophy with chronic small vessel white matter ischemic demyelination. 2. Diffuse degenerative changes in the cervical spine without acute bony abnormality.  Electronically Signed   By: Kennith CenterEric  Mansell M.D.   On: 12/26/2015 14:34    1220:  NH told ED RN that pt "might have said his neck hurt;" pt denies neck pain when asked and while examined. Pt is currently cooperative and calm. Will continue to monitor as workup progresses.   1450:  Pt has eaten a meal while in the ED without N/V. Pt has been ambulatory with steady gait, easy resps, NAD. Pt has remained calm/cooperative while in the ED. Will d/c back to NH for f/u with PMD.   Samuel JesterKathleen Mishka Stegemann, DO 12/30/15 1358

## 2015-12-26 NOTE — ED Notes (Signed)
Per [facility], [facility] agitation with staff this am. Pt alert and cooperative. Pt has dementia at baseline. Pt denies any pain,gi/gu symptoms.

## 2015-12-26 NOTE — ED Notes (Signed)
Patient discharged with [facility] transporter back to facility

## 2016-02-27 ENCOUNTER — Emergency Department (HOSPITAL_COMMUNITY)
Admission: EM | Admit: 2016-02-27 | Discharge: 2016-02-27 | Disposition: A | Discharge status: Home / Self Care | Payer: Medicare Other | Attending: Emergency Medicine | Admitting: Emergency Medicine

## 2016-02-27 ENCOUNTER — Emergency Department (HOSPITAL_COMMUNITY): Payer: Medicare Other

## 2016-02-27 ENCOUNTER — Encounter (HOSPITAL_COMMUNITY): Payer: Self-pay

## 2016-02-27 DIAGNOSIS — Z79899 Other long term (current) drug therapy: Secondary | ICD-10-CM | POA: Insufficient documentation

## 2016-02-27 DIAGNOSIS — Z87891 Personal history of nicotine dependence: Secondary | ICD-10-CM | POA: Diagnosis not present

## 2016-02-27 DIAGNOSIS — R918 Other nonspecific abnormal finding of lung field: Secondary | ICD-10-CM | POA: Diagnosis not present

## 2016-02-27 DIAGNOSIS — J40 Bronchitis, not specified as acute or chronic: Secondary | ICD-10-CM | POA: Insufficient documentation

## 2016-02-27 DIAGNOSIS — R05 Cough: Secondary | ICD-10-CM | POA: Diagnosis not present

## 2016-02-27 DIAGNOSIS — F0390 Unspecified dementia without behavioral disturbance: Secondary | ICD-10-CM | POA: Diagnosis not present

## 2016-02-27 DIAGNOSIS — R402411 Glasgow coma scale score 13-15, in the field [EMT or ambulance]: Secondary | ICD-10-CM | POA: Diagnosis not present

## 2016-02-27 MED ORDER — ACETAMINOPHEN 500 MG PO TABS
ORAL_TABLET | ORAL | Status: AC
  Filled 2016-02-27: qty 2

## 2016-02-27 MED ORDER — ALBUTEROL SULFATE HFA 108 (90 BASE) MCG/ACT IN AERS: 1.0000 | INHALATION_SPRAY | Freq: Four times a day (QID) | RESPIRATORY_TRACT | 0 refills | Status: DC | PRN

## 2016-02-27 MED ORDER — ACETAMINOPHEN 500 MG PO TABS
1000.0000 mg | ORAL_TABLET | Freq: Once | ORAL | Status: AC
  Administered 2016-02-27: 1000 mg via ORAL

## 2016-02-27 MED ORDER — IPRATROPIUM-ALBUTEROL 0.5-2.5 (3) MG/3ML IN SOLN
3.0000 mL | Freq: Once | RESPIRATORY_TRACT | Status: AC
  Administered 2016-02-27: 3 mL via RESPIRATORY_TRACT
  Filled 2016-02-27: qty 3

## 2016-02-27 NOTE — ED Notes (Signed)
Pt assisted to the bathroom and back.

## 2016-02-27 NOTE — ED Notes (Signed)
Complain of headache and neck pain.

## 2016-02-27 NOTE — ED Provider Notes (Signed)
[facility]-EMERGENCY DEPT Provider Note   CSN: [id number] Arrival date & time: 02/27/16  0840  By signing my name below, I, Larry Cox, attest that this documentation has been prepared under the direction and in the presence of Vanetta MuldersScott Ryson Bacha, MD. Electronically Signed: Placido SouLogan Cox, ED Scribe. 02/27/16. 10:17 AM.   History   Chief Complaint Chief Complaint  Patient presents with  . Cough   LEVEL 5 CAVEAT: DEMENTIA  HPI HPI Comments: Larry Cox is a 82 y.o. male who presents to the Emergency Department by ambulance complaining of constant, moderate, cough x 1 week. Per respiratory therapist, pt was not wheezing but has been experiencing a productive cough. Pt is not answering questions clearly based on provider's questions.    The history is provided by medical records and the patient. History limited by: dementia   Cough  The cough is productive of sputum. There has been no fever.    Past Medical History:  Diagnosis Date  . Alzheimer's dementia   . Anemia   . Arthritis   . Chronic abdominal pain   . Chronic back pain   . Chronic nausea   . Diverticulitis of colon 11/14/2011  . Dizziness   . GERD (gastroesophageal reflux disease)   . Hearing deficit   . Hernia    hiatal and inguinal  . Hyponatremia   . Memory deficits   . Middle cerebral artery aneurysm 11/16/2011   6 mm.  . Small bowel obstruction (HCC)   . Thyroid disease     Patient Active Problem List   Diagnosis Date Noted  . Acute diverticulitis 10/05/2015  . Dementia 12/11/2012  . Atypical chest pain 12/11/2012  . Abdominal pain, chronic, left lower quadrant 12/11/2012  . Bilateral inguinal hernia 12/11/2012  . Acute encephalopathy 11/09/2012  . Unspecified hypothyroidism 11/09/2012  . Aphasia 11/09/2012  . Muscle cramps 11/09/2012  . Nausea with vomiting 11/09/2012  . Hypokalemia 04/29/2012  . Chronic abdominal pain 03/31/2012  . GERD (gastroesophageal reflux disease) 03/31/2012  . Middle  cerebral artery aneurysm 11/16/2011  . Elevated BP 11/15/2011  . Sundowning 11/15/2011  . Metabolic acidosis 11/14/2011  . Diverticulitis of colon 11/14/2011  . Anemia 11/14/2011  . Dizziness 11/14/2011  . SBO (small bowel obstruction) (HCC) 05/09/2011  . Hyponatremia 05/09/2011    Past Surgical History:  Procedure Laterality Date  . COLONOSCOPY  04/08/2012   RMR: Pancolonic diverticulosis       Home Medications    Prior to Admission medications   Medication Sig Start Date End Date Taking? Authorizing Provider  acetaminophen (TYLENOL) 500 MG tablet Take 500 mg by mouth every 6 (six) hours as needed for mild pain, fever or headache.   Yes Historical Provider, MD  diphenhydrAMINE (BENADRYL) 25 MG tablet Take 25 mg by mouth every 6 (six) hours as needed for itching.   Yes Historical Provider, MD  famotidine (PEPCID) 20 MG tablet Take 20 mg by mouth at bedtime.   Yes Historical Provider, MD  levothyroxine (SYNTHROID, LEVOTHROID) 75 MCG tablet Take 75 mcg by mouth daily before breakfast.   Yes Historical Provider, MD  omeprazole (PRILOSEC) 20 MG capsule Take 20 mg by mouth daily before breakfast.    Yes Historical Provider, MD  ondansetron (ZOFRAN) 4 MG tablet Take 4 mg by mouth every 4 (four) hours as needed for nausea or vomiting.   Yes Historical Provider, MD  polyethylene glycol (MIRALAX / GLYCOLAX) packet Take 17 g by mouth daily as needed for mild constipation.  Yes Historical Provider, MD  albuterol (PROVENTIL HFA;VENTOLIN HFA) 108 (90 Base) MCG/ACT inhaler Inhale 1-2 puffs into the lungs every 6 (six) hours as needed for wheezing or shortness of breath. 02/27/16   Vanetta Mulders, MD    Family History Family History  Problem Relation Age of Onset  . Cancer Mother     stomach?  . Colon cancer Neg Hx     Social History Social History  Substance Use Topics  . Smoking status: Former Smoker    Packs/day: 0.30    Types: Cigarettes  . Smokeless tobacco: Former Neurosurgeon     Quit date: 07/29/1958  . Alcohol use No     Comment: 1 beer occasionally     Allergies   Aricept [donepezil hcl]; Diazepam; Doxycycline; and Lorazepam   Review of Systems Review of Systems  Unable to perform ROS: Dementia  Respiratory: Positive for cough.    Physical Exam Updated Vital Signs BP 112/62 (BP Location: Right Arm)   Pulse 71   Temp 98.2 F (36.8 C) (Oral)   Resp 18   Ht 5\' 8"  (1.727 m)   Wt 72.6 kg   SpO2 95%   BMI 24.33 kg/m   Physical Exam  Constitutional: He appears well-developed.  HENT:  Head: Normocephalic and atraumatic.  Eyes: Conjunctivae and EOM are normal. Pupils are equal, round, and reactive to light. No scleral icterus.  Cardiovascular: Normal rate, regular rhythm and normal heart sounds.   No murmur heard. Pulmonary/Chest: Effort normal and breath sounds normal. No respiratory distress. He has no wheezes. He has no rales.  Abdominal: Soft. Bowel sounds are normal. There is no tenderness.  Musculoskeletal: Normal range of motion. He exhibits no edema.  Trace edema noted to bilateral lower extremities.   Neurological: He is alert.  Pt is ambulatory and neurologically intact.   Skin: Skin is warm and dry.  Nursing note and vitals reviewed.  ED Treatments / Results  Labs (all labs ordered are listed, but only abnormal results are displayed) Labs Reviewed - No data to display  EKG  EKG Interpretation None       Radiology Dg Chest 2 View  Result Date: 02/27/2016 CLINICAL DATA:  Cough for 1 week EXAM: CHEST  2 VIEW COMPARISON:  December 26, 2015 FINDINGS: There is patchy atelectasis in the left base, increased from prior study. Lungs elsewhere clear. Heart size and pulmonary vascularity are normal. No adenopathy. There is atherosclerotic calcification in the aortic arch. There is degenerative change in the thoracic spine. IMPRESSION: Atelectasis left base, increased from 2 months prior. Earliest changes of pneumonia may be present in this  area. Lungs elsewhere clear. There is aortic atherosclerosis. Electronically Signed   By: Bretta Bang III M.D.   On: 02/27/2016 09:22   Ct Chest Wo Contrast  Result Date: 02/27/2016 CLINICAL DATA:  Cough for 1 week.  Evaluate for pneumonia. EXAM: CT CHEST WITHOUT CONTRAST TECHNIQUE: Multidetector CT imaging of the chest was performed following the standard protocol without IV contrast. COMPARISON:  12/12/2015 FINDINGS: Cardiovascular: The heart size appears normal. No pericardial effusion identified. Aortic atherosclerosis is noted. There is calcification within the LAD coronary artery. Mediastinum/Nodes: The trachea appears patent and is midline. Normal appearance of the esophagus. Calcified mediastinal lymph nodes are identified compatible with prior granulomatous disease. No adenopathy identified. Lungs/Pleura: There is no ascites or focal fluid collections within the abdomen or pelvis. Cluster of tree-in-bud nodules within the posterior right lower lobe is new from previous exam, image 59 of  series 3. There is bronchial wall thickening, subsegmental atelectasis involving both lower lobes likely inflammatory or infectious in etiology. No consolidation identified at this time. A few small pulmonary nodules are identified index nodule in the left upper lobe is unchanged measuring 3 mm, image 29 of series 3. Stable ground-glass nodule in the left upper lobe which measures 4 mm, image 77 of series 3. Upper Abdomen: No focal liver abnormality. The gallbladder appears unremarkable. The visualized portions of the spleen and adrenal glands are normal. The pancreas is unremarkable. Musculoskeletal: There is a scoliosis deformity involving the thoracic spine which is convex towards the left. Multi focal degenerative disc disease is noted within the thoracic and lower lumbar spine. IMPRESSION: 1. No acute cardiopulmonary abnormalities. 2. A few small pulmonary nodules are identified which are nonspecific. These do  not appear significantly changed from previous exam. In a patient that is at low risk no further follow-up is advised. Non-contrast chest CT can be considered in 12 months (from 12/12/2015) if patient is high-risk. This recommendation follows the consensus statement: Guidelines for Management of Incidental Pulmonary Nodules Detected on CT Images:From the Fleischner Society 2017; published online before print (10.1148/radiol.1610960454). 3. Scoliosis and degenerative disc disease 4. Aortic atherosclerosis and coronary artery calcification. Electronically Signed   By: Signa Kell M.D.   On: 02/27/2016 10:48    Procedures Procedures  DIAGNOSTIC STUDIES: Oxygen Saturation is 96% on RA, normal by my interpretation.    COORDINATION OF CARE: 10:06 AM Discussed next steps with pt. Will move forward with plan based on patient evaluation.   Medications Ordered in ED Medications  ipratropium-albuterol (DUONEB) 0.5-2.5 (3) MG/3ML nebulizer solution 3 mL (3 mLs Nebulization Given 02/27/16 0956)  acetaminophen (TYLENOL) tablet 1,000 mg (1,000 mg Oral Given 02/27/16 1025)     Initial Impression / Assessment and Plan / ED Course  I have reviewed the triage vital signs and the nursing notes.  Pertinent labs & imaging results that were available during my care of the patient were reviewed by me and considered in my medical decision making (see chart for details).  Clinical Course    Patient from [facility] patient is in the memory care. Patient with significant productive cough. Workup showed no evidence of pneumonia. Record chest x-ray raises some question of early pneumonia CT scan of the chest was negative. Patient seemed to improve with albuterol nebulizer however patient really had no wheezing. Discussed with his daughter and will give a trial of albuterol. He'll follow-up with his primary care doctor. Patient nontoxic no acute distress. Room air was 96% saturation.   I personally performed  the services described in this documentation, which was scribed in my presence. The recorded information has been reviewed and is accurate.       Final Clinical Impressions(s) / ED Diagnoses   Final diagnoses:  Bronchitis    New Prescriptions New Prescriptions   ALBUTEROL (PROVENTIL HFA;VENTOLIN HFA) 108 (90 BASE) MCG/ACT INHALER    Inhale 1-2 puffs into the lungs every 6 (six) hours as needed for wheezing or shortness of breath.     Vanetta Mulders, MD 02/27/16 [phone]

## 2016-02-27 NOTE — ED Notes (Signed)
Pt assisted to bathroom.  Daughter brought clean clothes.  Pt assisted to change.

## 2016-02-27 NOTE — ED Notes (Signed)
Pt over for CT

## 2016-02-27 NOTE — ED Triage Notes (Signed)
Complain of cough for about a week.

## 2016-02-27 NOTE — Discharge Instructions (Signed)
Chest x-ray and CT scan of the chest without evidence of pneumonia. Patient with significant bronchitis. Recommend continuing current meds and using albuterol inhaler 2 puffs every 6 hours. Follow-up with his primary care doctor. Return for any new or worse symptoms.

## 2016-02-27 NOTE — ED Notes (Signed)
Pt insisting his daughter be called to bring him. States he is not suppose to be here.

## 2016-02-29 DIAGNOSIS — S161XXA Strain of muscle, fascia and tendon at neck level, initial encounter: Secondary | ICD-10-CM | POA: Diagnosis not present

## 2016-02-29 DIAGNOSIS — S0083XA Contusion of other part of head, initial encounter: Secondary | ICD-10-CM | POA: Diagnosis not present

## 2016-02-29 DIAGNOSIS — W19XXXA Unspecified fall, initial encounter: Secondary | ICD-10-CM | POA: Diagnosis not present

## 2016-02-29 DIAGNOSIS — F0390 Unspecified dementia without behavioral disturbance: Secondary | ICD-10-CM | POA: Diagnosis not present

## 2016-02-29 DIAGNOSIS — S0181XA Laceration without foreign body of other part of head, initial encounter: Secondary | ICD-10-CM | POA: Diagnosis not present

## 2016-02-29 DIAGNOSIS — S098XXA Other specified injuries of head, initial encounter: Secondary | ICD-10-CM | POA: Diagnosis not present

## 2016-02-29 DIAGNOSIS — S0081XA Abrasion of other part of head, initial encounter: Secondary | ICD-10-CM | POA: Diagnosis not present

## 2016-02-29 DIAGNOSIS — Z888 Allergy status to other drugs, medicaments and biological substances status: Secondary | ICD-10-CM | POA: Diagnosis not present

## 2016-02-29 DIAGNOSIS — Y92192 Bathroom in other specified residential institution as the place of occurrence of the external cause: Secondary | ICD-10-CM | POA: Diagnosis not present

## 2016-03-07 DIAGNOSIS — S0990XA Unspecified injury of head, initial encounter: Secondary | ICD-10-CM | POA: Diagnosis not present

## 2016-03-07 DIAGNOSIS — G309 Alzheimer's disease, unspecified: Secondary | ICD-10-CM | POA: Diagnosis not present

## 2016-03-07 DIAGNOSIS — Z23 Encounter for immunization: Secondary | ICD-10-CM | POA: Diagnosis not present

## 2016-05-23 DIAGNOSIS — R609 Edema, unspecified: Secondary | ICD-10-CM | POA: Diagnosis not present

## 2016-05-23 DIAGNOSIS — L03115 Cellulitis of right lower limb: Secondary | ICD-10-CM | POA: Diagnosis not present

## 2016-05-23 DIAGNOSIS — R600 Localized edema: Secondary | ICD-10-CM | POA: Diagnosis not present

## 2016-05-23 DIAGNOSIS — F0390 Unspecified dementia without behavioral disturbance: Secondary | ICD-10-CM | POA: Diagnosis not present

## 2016-05-23 DIAGNOSIS — L309 Dermatitis, unspecified: Secondary | ICD-10-CM | POA: Diagnosis not present

## 2016-05-23 DIAGNOSIS — Z888 Allergy status to other drugs, medicaments and biological substances status: Secondary | ICD-10-CM | POA: Diagnosis not present

## 2016-05-23 DIAGNOSIS — L03116 Cellulitis of left lower limb: Secondary | ICD-10-CM | POA: Diagnosis not present

## 2016-05-29 DIAGNOSIS — R609 Edema, unspecified: Secondary | ICD-10-CM | POA: Diagnosis not present

## 2016-05-29 DIAGNOSIS — I872 Venous insufficiency (chronic) (peripheral): Secondary | ICD-10-CM | POA: Diagnosis not present

## 2016-07-28 DIAGNOSIS — K219 Gastro-esophageal reflux disease without esophagitis: Secondary | ICD-10-CM | POA: Diagnosis not present

## 2016-07-28 DIAGNOSIS — R600 Localized edema: Secondary | ICD-10-CM | POA: Diagnosis not present

## 2016-07-28 DIAGNOSIS — H6120 Impacted cerumen, unspecified ear: Secondary | ICD-10-CM | POA: Diagnosis not present

## 2016-07-28 DIAGNOSIS — E039 Hypothyroidism, unspecified: Secondary | ICD-10-CM | POA: Diagnosis not present

## 2016-08-18 DIAGNOSIS — R600 Localized edema: Secondary | ICD-10-CM | POA: Diagnosis not present

## 2016-08-18 DIAGNOSIS — L299 Pruritus, unspecified: Secondary | ICD-10-CM | POA: Diagnosis not present

## 2016-08-18 DIAGNOSIS — L0390 Cellulitis, unspecified: Secondary | ICD-10-CM | POA: Diagnosis not present

## 2016-09-01 DIAGNOSIS — E039 Hypothyroidism, unspecified: Secondary | ICD-10-CM | POA: Diagnosis not present

## 2016-09-01 DIAGNOSIS — R609 Edema, unspecified: Secondary | ICD-10-CM | POA: Diagnosis not present

## 2016-09-01 DIAGNOSIS — Z6831 Body mass index (BMI) 31.0-31.9, adult: Secondary | ICD-10-CM | POA: Diagnosis not present

## 2016-09-01 DIAGNOSIS — F0390 Unspecified dementia without behavioral disturbance: Secondary | ICD-10-CM | POA: Diagnosis not present

## 2016-09-23 ENCOUNTER — Inpatient Hospital Stay (HOSPITAL_COMMUNITY)
Admission: EM | Admit: 2016-09-23 | Discharge: 2016-09-26 | DRG: 603 | Disposition: A | Discharge status: Nursing Home (Includes ICF) | Payer: Medicare Other | Attending: Internal Medicine | Admitting: Internal Medicine

## 2016-09-23 ENCOUNTER — Encounter (HOSPITAL_COMMUNITY): Payer: Self-pay | Admitting: Emergency Medicine

## 2016-09-23 ENCOUNTER — Emergency Department (HOSPITAL_COMMUNITY): Payer: Medicare Other

## 2016-09-23 DIAGNOSIS — J069 Acute upper respiratory infection, unspecified: Secondary | ICD-10-CM | POA: Diagnosis present

## 2016-09-23 DIAGNOSIS — F05 Delirium due to known physiological condition: Secondary | ICD-10-CM | POA: Diagnosis present

## 2016-09-23 DIAGNOSIS — F0281 Dementia in other diseases classified elsewhere with behavioral disturbance: Secondary | ICD-10-CM | POA: Diagnosis not present

## 2016-09-23 DIAGNOSIS — L03116 Cellulitis of left lower limb: Principal | ICD-10-CM | POA: Diagnosis present

## 2016-09-23 DIAGNOSIS — F0390 Unspecified dementia without behavioral disturbance: Secondary | ICD-10-CM | POA: Diagnosis present

## 2016-09-23 DIAGNOSIS — M549 Dorsalgia, unspecified: Secondary | ICD-10-CM | POA: Diagnosis present

## 2016-09-23 DIAGNOSIS — K921 Melena: Secondary | ICD-10-CM | POA: Diagnosis not present

## 2016-09-23 DIAGNOSIS — L539 Erythematous condition, unspecified: Secondary | ICD-10-CM

## 2016-09-23 DIAGNOSIS — L0390 Cellulitis, unspecified: Secondary | ICD-10-CM | POA: Insufficient documentation

## 2016-09-23 DIAGNOSIS — G8929 Other chronic pain: Secondary | ICD-10-CM | POA: Diagnosis present

## 2016-09-23 DIAGNOSIS — Z881 Allergy status to other antibiotic agents status: Secondary | ICD-10-CM

## 2016-09-23 DIAGNOSIS — F0391 Unspecified dementia with behavioral disturbance: Secondary | ICD-10-CM

## 2016-09-23 DIAGNOSIS — Z66 Do not resuscitate: Secondary | ICD-10-CM | POA: Diagnosis not present

## 2016-09-23 DIAGNOSIS — R600 Localized edema: Secondary | ICD-10-CM | POA: Diagnosis not present

## 2016-09-23 DIAGNOSIS — Z0389 Encounter for observation for other suspected diseases and conditions ruled out: Secondary | ICD-10-CM | POA: Diagnosis not present

## 2016-09-23 DIAGNOSIS — I739 Peripheral vascular disease, unspecified: Secondary | ICD-10-CM | POA: Diagnosis not present

## 2016-09-23 DIAGNOSIS — Z888 Allergy status to other drugs, medicaments and biological substances status: Secondary | ICD-10-CM

## 2016-09-23 DIAGNOSIS — E039 Hypothyroidism, unspecified: Secondary | ICD-10-CM | POA: Diagnosis present

## 2016-09-23 DIAGNOSIS — G309 Alzheimer's disease, unspecified: Secondary | ICD-10-CM | POA: Diagnosis present

## 2016-09-23 DIAGNOSIS — D649 Anemia, unspecified: Secondary | ICD-10-CM | POA: Diagnosis not present

## 2016-09-23 DIAGNOSIS — Z781 Physical restraint status: Secondary | ICD-10-CM

## 2016-09-23 DIAGNOSIS — L309 Dermatitis, unspecified: Secondary | ICD-10-CM | POA: Diagnosis present

## 2016-09-23 DIAGNOSIS — L299 Pruritus, unspecified: Secondary | ICD-10-CM | POA: Diagnosis not present

## 2016-09-23 DIAGNOSIS — R451 Restlessness and agitation: Secondary | ICD-10-CM | POA: Diagnosis not present

## 2016-09-23 DIAGNOSIS — R05 Cough: Secondary | ICD-10-CM | POA: Diagnosis not present

## 2016-09-23 DIAGNOSIS — I671 Cerebral aneurysm, nonruptured: Secondary | ICD-10-CM | POA: Diagnosis present

## 2016-09-23 DIAGNOSIS — H9190 Unspecified hearing loss, unspecified ear: Secondary | ICD-10-CM | POA: Diagnosis present

## 2016-09-23 DIAGNOSIS — K219 Gastro-esophageal reflux disease without esophagitis: Secondary | ICD-10-CM | POA: Diagnosis present

## 2016-09-23 DIAGNOSIS — Z79899 Other long term (current) drug therapy: Secondary | ICD-10-CM

## 2016-09-23 LAB — COMPREHENSIVE METABOLIC PANEL
ALK PHOS: 58 U/L (ref 38–126)
ALT: 14 U/L — ABNORMAL LOW (ref 17–63)
AST: 22 U/L (ref 15–41)
Albumin: 3.8 g/dL (ref 3.5–5.0)
Anion gap: 8 (ref 5–15)
BILIRUBIN TOTAL: 0.8 mg/dL (ref 0.3–1.2)
BUN: 16 mg/dL (ref 6–20)
CALCIUM: 8.8 mg/dL — AB (ref 8.9–10.3)
CO2: 24 mmol/L (ref 22–32)
Chloride: 102 mmol/L (ref 101–111)
Creatinine, Ser: 0.8 mg/dL (ref 0.61–1.24)
GFR calc Af Amer: >60 mL/min (ref >60)
GFR calc non Af Amer: >60 mL/min (ref >60)
GLUCOSE: 97 mg/dL (ref 65–99)
POTASSIUM: 4.6 mmol/L (ref 3.5–5.1)
Sodium: 134 mmol/L — ABNORMAL LOW (ref 135–145)
Total Protein: 6.5 g/dL (ref 6.5–8.1)

## 2016-09-23 LAB — URINALYSIS, ROUTINE W REFLEX MICROSCOPIC
Bilirubin Urine: NEGATIVE
Glucose, UA: NEGATIVE mg/dL
Hgb urine dipstick: NEGATIVE
KETONES UR: NEGATIVE mg/dL
LEUKOCYTES UA: NEGATIVE
NITRITE: NEGATIVE
PH: 7 (ref 5.0–8.0)
Protein, ur: NEGATIVE mg/dL
SPECIFIC GRAVITY, URINE: 1.016 (ref 1.005–1.030)

## 2016-09-23 LAB — CBC WITH DIFFERENTIAL/PLATELET
BASOS ABS: 0.1 10*3/uL (ref 0.0–0.1)
BASOS PCT: 1 %
Eosinophils Absolute: 0.6 10*3/uL (ref 0.0–0.7)
Eosinophils Relative: 8 %
HEMATOCRIT: 36.1 % — AB (ref 39.0–52.0)
HEMOGLOBIN: 12.3 g/dL — AB (ref 13.0–17.0)
Lymphocytes Relative: 16 %
Lymphs Abs: 1.3 10*3/uL (ref 0.7–4.0)
MCH: 32.8 pg (ref 26.0–34.0)
MCHC: 34.1 g/dL (ref 30.0–36.0)
MCV: 96.3 fL (ref 78.0–100.0)
Monocytes Absolute: 1 10*3/uL (ref 0.1–1.0)
Monocytes Relative: 12 %
NEUTROS PCT: 64 %
Neutro Abs: 5.4 10*3/uL (ref 1.7–7.7)
Platelets: 260 10*3/uL (ref 150–400)
RBC: 3.75 MIL/uL — ABNORMAL LOW (ref 4.22–5.81)
RDW: 15.1 % (ref 11.5–15.5)
WBC: 8.4 10*3/uL (ref 4.0–10.5)

## 2016-09-23 LAB — PROTIME-INR
INR: 0.95
Prothrombin Time: 12.6 seconds (ref 11.4–15.2)

## 2016-09-23 MED ORDER — ACETAMINOPHEN 650 MG RE SUPP: 650.0000 mg | Freq: Four times a day (QID) | RECTAL | Status: DC | PRN

## 2016-09-23 MED ORDER — POLYETHYLENE GLYCOL 3350 17 G PO PACK: 17.0000 g | PACK | Freq: Every day | ORAL | Status: DC | PRN

## 2016-09-23 MED ORDER — ENOXAPARIN SODIUM 40 MG/0.4ML ~~LOC~~ SOLN
40.0000 mg | SUBCUTANEOUS | Status: DC
  Filled 2016-09-23: qty 0.4

## 2016-09-23 MED ORDER — TRIAMCINOLONE 0.1 % CREAM:EUCERIN CREAM 1:1
TOPICAL_CREAM | Freq: Three times a day (TID) | CUTANEOUS | Status: DC
  Administered 2016-09-24: 1 via TOPICAL
  Administered 2016-09-24 – 2016-09-25 (×3): via TOPICAL
  Administered 2016-09-25: 1 via TOPICAL
  Administered 2016-09-26: 11:00:00 via TOPICAL
  Filled 2016-09-23 (×2): qty 1

## 2016-09-23 MED ORDER — ONDANSETRON HCL 4 MG PO TABS: 4.0000 mg | ORAL_TABLET | Freq: Four times a day (QID) | ORAL | Status: DC | PRN

## 2016-09-23 MED ORDER — HYDROXYZINE HCL 25 MG PO TABS
25.0000 mg | ORAL_TABLET | Freq: Once | ORAL | Status: DC
  Filled 2016-09-23: qty 1

## 2016-09-23 MED ORDER — HYDROCORTISONE 2.5 % RE CREA
1.0000 "application " | TOPICAL_CREAM | Freq: Four times a day (QID) | RECTAL | Status: DC
  Filled 2016-09-23: qty 28.35

## 2016-09-23 MED ORDER — DIPHENHYDRAMINE HCL 25 MG PO CAPS
25.0000 mg | ORAL_CAPSULE | Freq: Four times a day (QID) | ORAL | Status: DC | PRN
  Administered 2016-09-25 (×3): 25 mg via ORAL
  Filled 2016-09-23 (×8): qty 1

## 2016-09-23 MED ORDER — HYDROXYZINE HCL 25 MG PO TABS
25.0000 mg | ORAL_TABLET | Freq: Three times a day (TID) | ORAL | Status: DC | PRN
  Administered 2016-09-24 – 2016-09-26 (×2): 25 mg via ORAL
  Filled 2016-09-23 (×2): qty 1

## 2016-09-23 MED ORDER — LEVOTHYROXINE SODIUM 88 MCG PO TABS
88.0000 ug | ORAL_TABLET | Freq: Every day | ORAL | Status: DC
  Administered 2016-09-24: 88 ug via ORAL
  Filled 2016-09-23 (×3): qty 1

## 2016-09-23 MED ORDER — ACETAMINOPHEN 325 MG PO TABS
650.0000 mg | ORAL_TABLET | Freq: Four times a day (QID) | ORAL | Status: DC | PRN
Start: 2016-09-23 — End: 2016-09-26
  Administered 2016-09-26: 650 mg via ORAL
  Filled 2016-09-23: qty 2

## 2016-09-23 MED ORDER — ZIPRASIDONE MESYLATE 20 MG IM SOLR
10.0000 mg | Freq: Once | INTRAMUSCULAR | Status: AC
  Administered 2016-09-23: 10 mg via INTRAMUSCULAR
  Filled 2016-09-23: qty 20

## 2016-09-23 MED ORDER — ONDANSETRON HCL 4 MG/2ML IJ SOLN: 4.0000 mg | Freq: Four times a day (QID) | INTRAMUSCULAR | Status: DC | PRN

## 2016-09-23 MED ORDER — FAMOTIDINE 20 MG PO TABS
20.0000 mg | ORAL_TABLET | Freq: Every day | ORAL | Status: DC
  Administered 2016-09-25: 20 mg via ORAL
  Filled 2016-09-23 (×2): qty 1

## 2016-09-23 MED ORDER — HYDROCORTISONE ACETATE 25 MG RE SUPP: 25.0000 mg | Freq: Three times a day (TID) | RECTAL | Status: DC

## 2016-09-23 NOTE — H&P (Signed)
History and Physical    WARDELL POKORSKI ZOX:[id number] DOB: 12/03/1921 DOA: 09/23/2016  PCP: Carylon Perches, MD Consultants:  [facility] Patient coming from: [facility] in [place]; [place]: daughter, [phone]  Chief Complaint: LE edema  HPI: Larry VANDERWEELE is a 83 y.o. male with medical history significant of advanced dementia, hard of hearing, GERD and anemia presenting with B LE edema.  History was provided solely by his daughter due to patient being very hard of hearing and having advanced dementia.  He was sent today from dementia unit for cellulitis - swelling, rash, itching on legs and feet.  Left leg worse than right.  Has been treated with Clindamycin x 10 days.  [facility] came today and said it was worse than 2 months ago when they treated him for it and they recommended he come in.  They were also concerned about dark stools.  [employer] said he had bleeding from the rectum.  Finally, daughter is concerned about congestion, coughing.  No fever.  c/o leg pain.  Also c/o itching.   ED Course:  Given Vistaril and Geodon for severe agitation; rectal deferred due to combative behavior  Review of Systems:  Unable to perform due to dementia  Ambulatory Status:  Ambulates without assistance  Past Medical History:  Diagnosis Date  . Alzheimer's dementia   . Anemia   . Arthritis   . Chronic abdominal pain   . Chronic back pain   . Chronic nausea   . Diverticulitis of colon 11/14/2011  . Dizziness   . GERD (gastroesophageal reflux disease)   . Hearing deficit   . Hernia    hiatal and inguinal  . Hyponatremia   . Middle cerebral artery aneurysm 11/16/2011   6 mm.  . Small bowel obstruction   . Thyroid disease     Past Surgical History:  Procedure Laterality Date  . COLONOSCOPY  04/08/2012   RMR: Pancolonic diverticulosis    Social History   Social History  . Marital status: Widowed    Spouse name: N/A  . Number of  children: N/A  . Years of education: N/A   Occupational History  . retired    Social History Main Topics  . Smoking status: Never Smoker  . Smokeless tobacco: Former Neurosurgeon    Quit date: 07/29/1958  . Alcohol use No     Comment: 1 beer occasionally  . Drug use: No  . Sexual activity: No   Other Topics Concern  . Not on file   Social History Narrative  . No narrative on file    Allergies  Allergen Reactions  . Aricept [Donepezil Hcl] Other (See Comments)    Reaction:  Confusion   . Diazepam Other (See Comments)    Reaction:  Hallucinations  . Doxycycline Other (See Comments)    Reaction:  Unknown   . Lorazepam Other (See Comments)    Reaction:  Hallucinations and violent behavior     Family History  Problem Relation Age of Onset  . Cancer Mother     stomach?  . Colon cancer Neg Hx     Prior to Admission medications   Medication Sig Start Date End Date Taking? Authorizing Provider  acetaminophen (TYLENOL) 500 MG tablet Take 500 mg by mouth every 6 (six) hours as needed for mild pain, fever or headache.   Yes Historical Provider, MD  Bran 500 MG TABS Take 2 tablets by mouth daily.   Yes Historical Provider,  MD  diphenhydrAMINE (BENADRYL) 25 MG tablet Take 25 mg by mouth every 6 (six) hours as needed for itching.   Yes Historical Provider, MD  famotidine (PEPCID) 20 MG tablet Take 20 mg by mouth at bedtime.   Yes Historical Provider, MD  hydrocortisone (ANUSOL-HC) 25 MG suppository Place 25 mg rectally 3 (three) times daily. 14 day course starting on 09/18/2016   Yes Historical Provider, MD  hydrocortisone (PROCTOSOL HC) 2.5 % rectal cream Place 1 application rectally 4 (four) times daily.   Yes Historical Provider, MD  hydrOXYzine (ATARAX/VISTARIL) 25 MG tablet Take 25 mg by mouth every 8 (eight) hours as needed.   Yes Historical Provider, MD  levothyroxine (SYNTHROID, LEVOTHROID) 88 MCG tablet Take 88 mcg by mouth daily before breakfast.    Yes Historical Provider, MD    polyethylene glycol (MIRALAX / GLYCOLAX) packet Take 17 g by mouth daily as needed for mild constipation.    Yes Historical Provider, MD  Probiotic Product (ALIGN) 4 MG CAPS Take 1 capsule by mouth daily.   Yes Historical Provider, MD  triamcinolone cream (KENALOG) 0.1 % Apply 1 application topically 2 (two) times daily. Applied to arms, legs, back, and feet for itching   Yes Historical Provider, MD    Physical Exam: Vitals:   09/23/16 1803 09/23/16 1830 09/23/16 1842  BP: (!) 127/101 (!) 135/95   Pulse: 86    Resp: 18 (!) 30   Temp: 98.4 F (36.9 C)    SpO2: 100%  100%  Weight: 72.6 kg (160 lb)    Height: 5\' 8"  (1.727 m)       General:  Appears calm and comfortable and is NAD Eyes:  PERRL, EOMI, normal lids, iris ENT:  grossly normal hearing, lips & tongue, mmm Neck:  no LAD, masses or thyromegaly Cardiovascular:  RRR, no m/r/g.  Respiratory:  CTA bilaterally, no w/r/r. Normal respiratory effort. Abdomen:  soft, ntnd, NABS Skin:  Diffuse left > right LE erythema, not streaking or clearly defined Musculoskeletal:  LE edema, L>R, with evidence of chronic stasis, puckering of skin Psychiatric: currently calm and only a bit resistant to examination Neurologic:  Unable to assess  Labs on Admission: I have personally reviewed following labs and imaging studies  CBC:  Recent Labs Lab 09/23/16 1936  WBC 8.4  NEUTROABS 5.4  HGB 12.3*  HCT 36.1*  MCV 96.3  PLT 260   Basic Metabolic Panel:  Recent Labs Lab 09/23/16 1936  NA 134*  K 4.6  CL 102  CO2 24  GLUCOSE 97  BUN 16  CREATININE 0.80  CALCIUM 8.8*   GFR: Estimated Creatinine Clearance: 57 mL/min (by C-G formula based on SCr of 0.8 mg/dL). Liver Function Tests:  Recent Labs Lab 09/23/16 1936  AST 22  ALT 14*  ALKPHOS 58  BILITOT 0.8  PROT 6.5  ALBUMIN 3.8   No results for input(s): LIPASE, AMYLASE in the last 168 hours. No results for input(s): AMMONIA in the last 168 hours. Coagulation  Profile:  Recent Labs Lab 09/23/16 1936  INR 0.95   Cardiac Enzymes: No results for input(s): CKTOTAL, CKMB, CKMBINDEX, TROPONINI in the last 168 hours. BNP (last 3 results) No results for input(s): PROBNP in the last 8760 hours. HbA1C: No results for input(s): HGBA1C in the last 72 hours. CBG: No results for input(s): GLUCAP in the last 168 hours. Lipid Profile: No results for input(s): CHOL, HDL, LDLCALC, TRIG, CHOLHDL, LDLDIRECT in the last 72 hours. Thyroid Function Tests: No results for  input(s): TSH, T4TOTAL, FREET4, T3FREE, THYROIDAB in the last 72 hours. Anemia Panel: No results for input(s): VITAMINB12, FOLATE, FERRITIN, TIBC, IRON, RETICCTPCT in the last 72 hours. Urine analysis:    Component Value Date/Time   COLORURINE YELLOW 09/23/2016 1823   APPEARANCEUR CLEAR 09/23/2016 1823   LABSPEC 1.016 09/23/2016 1823   PHURINE 7.0 09/23/2016 1823   GLUCOSEU NEGATIVE 09/23/2016 1823   HGBUR NEGATIVE 09/23/2016 1823   BILIRUBINUR NEGATIVE 09/23/2016 1823   KETONESUR NEGATIVE 09/23/2016 1823   PROTEINUR NEGATIVE 09/23/2016 1823   UROBILINOGEN 0.2 02/14/2015 1500   NITRITE NEGATIVE 09/23/2016 1823   LEUKOCYTESUR NEGATIVE 09/23/2016 1823    Creatinine Clearance: Estimated Creatinine Clearance: 57 mL/min (by C-G formula based on SCr of 0.8 mg/dL).  Sepsis Labs: @LABRCNTIP (procalcitonin:4,lacticidven:4) )No results found for this or any previous visit (from the past 240 hour(s)).   Radiological Exams on Admission: Dg Chest 2 View  Result Date: 09/23/2016 CLINICAL DATA:  Cough.  Alzheimer's dementia.  Ex-smoker. EXAM: CHEST  2 VIEW COMPARISON:  02/27/2016 FINDINGS: Lateral view degraded by patient arm position. Moderate thoracolumbar spondylosis. Midline trachea. Mild cardiomegaly. No pleural effusion or pneumothorax. Left base opacity is unchanged, favoring scarring. There is minimal volume loss at the right lung base. IMPRESSION: No acute cardiopulmonary disease.  Cardiomegaly with bibasilar chronic opacities, favoring scarring on the left and mild atelectasis on the right. Electronically Signed   By: Jeronimo Greaves M.D.   On: 09/23/2016 19:16    EKG: Independently reviewed.  NSR with rate 77;  no evidence of acute ischemia  Assessment/Plan Principal Problem:   Bilateral lower extremity edema Active Problems:   Anemia   Dementia   URI (upper respiratory infection)   LE edema with erythema -While cellulitis is a consideration: 1- it is confluent and without streaking; 2- he has puckering of the skin that indicates that this is a more chronic issue; 3- it is bilateral; 4- he has not had fever or appeared ill; 5- he has a normal WBC count; and 5- he has already been treated with 10 days of Clindamycin. -For now, will not treat with antibiotics.  The most logical choice of treatment with Clindamycin failure would be vancomycin and with the low suspicion for cellulitis, it seems an unnecessary risk to give it.   -Will observe overnight without antibiotics.  Obviously, if he develops fever or otherwise appears ill then we will start Vanc. -More likely, this appears to be chronic venous stasis with stasis dermatitis.   -Will check ABIs. -Will request LLE DVT US because that is also a consideration - it is warm and red compared to the right. -Will place TED hose. -Will use Eucerin:triamcinolone TID for dermatitis.  Anemia -Hgb 12.3, stable -There was a concern for "dark stools" at his facility -Given his dementia and behavioral disturbance (see below), performing a rectal exam to obtain stool would be challenging and likely not be particularly helpful. -Will plan to guaiac stools if Hgb is dropping but it is currently stable so will not intervene at this time. -He has known hemorrhoids and is receiving Anusol HC for this.  Dementia -The patient has advanced dementia with sundowning -He has already had behavioral difficulties in the ER -He was given  hydroxyzine and Geodon in the ER -He needs a bedside sitter both in the ER and upstairs -He will benefit from being discharged back to his home environment as soon as it is safe  URI -Congestion, mild -Unremarkable PE -Will follow   DVT prophylaxis:  Lovenox Code Status: DNR - confirmed with family Family Communication: Daughter present throughout evaluation Disposition Plan: Back to SNF once clinically improved - SW consult requested Consults called: SW  Admission status: It is my clinical opinion that referral for OBSERVATION is reasonable and necessary in this patient based on the above information provided. The aforementioned taken together are felt to place the patient at high risk for further clinical deterioration. However it is anticipated that the patient may be medically stable for discharge from the hospital within 24 to 48 hours.     Jonah BlueJennifer Tyjae Shvartsman MD [facility]  If 7PM-7AM, please contact night-coverage [email or web address] Password [email or web address]  09/23/2016, 10:31 PM

## 2016-09-23 NOTE — ED Provider Notes (Signed)
[facility]-EMERGENCY DEPT Provider Note   CSN: [id number] Arrival date & time: 09/23/16  1750     History   Chief Complaint Chief Complaint  Patient presents with  . Recurrent Skin Infections    HPI Larry Cox is a 83 y.o. male.  HPI Level V caveat due to dementia. Patient sent from the nursing home. Has cellulitis in his left lower leg and has been on clindamycin. Saline was reportedly worsening. Also reportedly had black stools today. Patient has dementia and is unable to really provide history. Patient's daughters with him and said that there had been some more confusion. Has had a cough.   Past Medical History:  Diagnosis Date  . Alzheimer's dementia   . Anemia   . Arthritis   . Chronic abdominal pain   . Chronic back pain   . Chronic nausea   . Diverticulitis of colon 11/14/2011  . Dizziness   . GERD (gastroesophageal reflux disease)   . Hearing deficit   . Hernia    hiatal and inguinal  . Hyponatremia   . Middle cerebral artery aneurysm 11/16/2011   6 mm.  . Small bowel obstruction   . Thyroid disease     Patient Active Problem List   Diagnosis Date Noted  . Cellulitis 09/23/2016  . Bilateral lower extremity edema 09/23/2016  . URI (upper respiratory infection) 09/23/2016  . Acute diverticulitis 10/05/2015  . Dementia 12/11/2012  . Atypical chest pain 12/11/2012  . Abdominal pain, chronic, left lower quadrant 12/11/2012  . Bilateral inguinal hernia 12/11/2012  . Acute encephalopathy 11/09/2012  . Unspecified hypothyroidism 11/09/2012  . Aphasia 11/09/2012  . Muscle cramps 11/09/2012  . Nausea with vomiting 11/09/2012  . Hypokalemia 04/29/2012  . Chronic abdominal pain 03/31/2012  . GERD (gastroesophageal reflux disease) 03/31/2012  . 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 Elevated BP 11/15/2011  . Sundowning 11/15/2011  . Metabolic acidosis 11/14/2011  . Diverticulitis of colon 11/14/2011  . Anemia 11/14/2011  . Dizziness 11/14/2011  .  SBO (small bowel obstruction) 05/09/2011  . Hyponatremia 05/09/2011    Past Surgical History:  Procedure Laterality Date  . COLONOSCOPY  04/08/2012   RMR: Pancolonic diverticulosis       Home Medications    Prior to Admission medications   Medication Sig Start Date End Date Taking? Authorizing Provider  acetaminophen (TYLENOL) 500 MG tablet Take 500 mg by mouth every 6 (six) hours as needed for mild pain, fever or headache.   Yes Historical Provider, MD  Bran 500 MG TABS Take 2 tablets by mouth daily.   Yes Historical Provider, MD  diphenhydrAMINE (BENADRYL) 25 MG tablet Take 25 mg by mouth every 6 (six) hours as needed for itching.   Yes Historical Provider, MD  famotidine (PEPCID) 20 MG tablet Take 20 mg by mouth at bedtime.   Yes Historical Provider, MD  hydrocortisone (ANUSOL-HC) 25 MG suppository Place 25 mg rectally 3 (three) times daily. 14 day course starting on 09/18/2016   Yes Historical Provider, MD  hydrocortisone (PROCTOSOL HC) 2.5 % rectal cream Place 1 application rectally 4 (four) times daily.   Yes Historical Provider, MD  hydrOXYzine (ATARAX/VISTARIL) 25 MG tablet Take 25 mg by mouth every 8 (eight) hours as needed.   Yes Historical Provider, MD  levothyroxine (SYNTHROID, LEVOTHROID) 88 MCG tablet Take 88 mcg by mouth daily before breakfast.    Yes Historical Provider, MD  polyethylene glycol (MIRALAX / GLYCOLAX) packet Take 17 g by mouth daily as needed for mild  constipation.    Yes Historical Provider, MD  Probiotic Product (ALIGN) 4 MG CAPS Take 1 capsule by mouth daily.   Yes Historical Provider, MD  triamcinolone cream (KENALOG) 0.1 % Apply 1 application topically 2 (two) times daily. Applied to arms, legs, back, and feet for itching   Yes Historical Provider, MD    Family History Family History  Problem Relation Age of Onset  . Cancer Mother     stomach?  . Colon cancer Neg Hx     Social History Social History  Substance Use Topics  . Smoking status:  Never Smoker  . Smokeless tobacco: Former NeurosurgeonUser    Quit date: 07/29/1958  . Alcohol use No     Comment: 1 beer occasionally     Allergies   Aricept [donepezil hcl]; Diazepam; Doxycycline; and Lorazepam   Review of Systems Review of Systems  Unable to perform ROS: Dementia     Physical Exam Updated Vital Signs BP (!) 135/95   Pulse 86   Temp 98.4 F (36.9 C)   Resp (!) 30   Ht 5\' 8"  (1.727 m)   Wt 160 lb (72.6 kg)   SpO2 100%   BMI 24.33 kg/m   Physical Exam  Constitutional: He appears well-developed.  HENT:  Head: Atraumatic.  Cardiovascular: Normal rate.   Pulmonary/Chest: Effort normal. No respiratory distress. He has no wheezes.  Abdominal: There is no tenderness.  Musculoskeletal: He exhibits edema.  Left lower extremity was edema compared to right side. Lower leg has erythema and some venous changes. No drainage. No fluctuance.  Neurological: He is alert.  Skin: Skin is warm.     ED Treatments / Results  Labs (all labs ordered are listed, but only abnormal results are displayed) Labs Reviewed  CBC WITH DIFFERENTIAL/PLATELET - Abnormal; Notable for the following:       Result Value   RBC 3.75 (*)    Hemoglobin 12.3 (*)    HCT 36.1 (*)    All other components within normal limits  COMPREHENSIVE METABOLIC PANEL - Abnormal; Notable for the following:    Sodium 134 (*)    Calcium 8.8 (*)    ALT 14 (*)    All other components within normal limits  URINALYSIS, ROUTINE W REFLEX MICROSCOPIC  PROTIME-INR  POC OCCULT BLOOD, ED    EKG  EKG Interpretation None       Radiology Dg Chest 2 View  Result Date: 09/23/2016 CLINICAL DATA:  Cough.  Alzheimer's dementia.  Ex-smoker. EXAM: CHEST  2 VIEW COMPARISON:  02/27/2016 FINDINGS: Lateral view degraded by patient arm position. Moderate thoracolumbar spondylosis. Midline trachea. Mild cardiomegaly. No pleural effusion or pneumothorax. Left base opacity is unchanged, favoring scarring. There is minimal volume  loss at the right lung base. IMPRESSION: No acute cardiopulmonary disease. Cardiomegaly with bibasilar chronic opacities, favoring scarring on the left and mild atelectasis on the right. Electronically Signed   By: Jeronimo GreavesKyle  Talbot M.D.   On: 09/23/2016 19:16    Procedures Procedures (including critical care time)  Medications Ordered in ED Medications  hydrOXYzine (ATARAX/VISTARIL) tablet 25 mg (25 mg Oral Not Given 09/23/16 2020)  ziprasidone (GEODON) injection 10 mg (10 mg Intramuscular Given 09/23/16 2020)     Initial Impression / Assessment and Plan / ED Course  I have reviewed the triage vital signs and the nursing notes.  Pertinent labs & imaging results that were available during my care of the patient were reviewed by me and considered in my medical decision  making (see chart for details).     Patient sent in for cellulitis of left lower leg. Has edema of both lower legs worse in the left. Erythema has reportedly worsened despite being on antibiotics. Also reportedly had black stool however his hemoglobin is normal. At this point she is not coming off to allow me to do a rectal exam. Labs overall reassuring. Will admit to internal medicine for further treatment.  Final Clinical Impressions(s) / ED Diagnoses   Final diagnoses:  Cellulitis of left lower extremity  Dementia with behavioral disturbance, unspecified dementia type    New Prescriptions New Prescriptions   No medications on file     Benjiman Core, MD 09/23/16 2320

## 2016-09-23 NOTE — ED Notes (Signed)
Pt has rash to right hip and outer thigh.  He keeps scratching the area

## 2016-09-23 NOTE — ED Notes (Signed)
Assisted pt to stand on side of bed to use urinal

## 2016-09-23 NOTE — ED Triage Notes (Signed)
Pt sent to ED by FNP at [facility] for lle cellulitis and black stool. Pt completed a 10 day course of clindamycin with no improvement in LLE cellulitis. Pt also reported black stools to FNP today.

## 2016-09-23 NOTE — ED Notes (Signed)
Pt is calmer now, not attempting to get out of bed anymore.

## 2016-09-23 NOTE — ED Notes (Signed)
Patient is cursing yelling out and attempting to get out of bed,  Pt states he is itching all over.  Pt redirected and repositioned the bed attempting to make more comfortable.

## 2016-09-24 ENCOUNTER — Observation Stay (HOSPITAL_COMMUNITY): Payer: Medicare Other

## 2016-09-24 ENCOUNTER — Observation Stay (HOSPITAL_COMMUNITY)
Admit: 2016-09-24 | Discharge: 2016-09-24 | Disposition: A | Discharge status: Home / Self Care | Payer: Medicare Other | Attending: Internal Medicine | Admitting: Internal Medicine

## 2016-09-24 DIAGNOSIS — G309 Alzheimer's disease, unspecified: Secondary | ICD-10-CM | POA: Diagnosis present

## 2016-09-24 DIAGNOSIS — F05 Delirium due to known physiological condition: Secondary | ICD-10-CM | POA: Diagnosis present

## 2016-09-24 DIAGNOSIS — F0390 Unspecified dementia without behavioral disturbance: Secondary | ICD-10-CM | POA: Diagnosis not present

## 2016-09-24 DIAGNOSIS — Z881 Allergy status to other antibiotic agents status: Secondary | ICD-10-CM | POA: Diagnosis not present

## 2016-09-24 DIAGNOSIS — F0281 Dementia in other diseases classified elsewhere with behavioral disturbance: Secondary | ICD-10-CM | POA: Diagnosis present

## 2016-09-24 DIAGNOSIS — M549 Dorsalgia, unspecified: Secondary | ICD-10-CM | POA: Diagnosis present

## 2016-09-24 DIAGNOSIS — Z888 Allergy status to other drugs, medicaments and biological substances status: Secondary | ICD-10-CM | POA: Diagnosis not present

## 2016-09-24 DIAGNOSIS — I671 Cerebral aneurysm, nonruptured: Secondary | ICD-10-CM | POA: Diagnosis present

## 2016-09-24 DIAGNOSIS — Z781 Physical restraint status: Secondary | ICD-10-CM | POA: Diagnosis not present

## 2016-09-24 DIAGNOSIS — G8929 Other chronic pain: Secondary | ICD-10-CM | POA: Diagnosis present

## 2016-09-24 DIAGNOSIS — Z79899 Other long term (current) drug therapy: Secondary | ICD-10-CM | POA: Diagnosis not present

## 2016-09-24 DIAGNOSIS — M7989 Other specified soft tissue disorders: Secondary | ICD-10-CM | POA: Diagnosis not present

## 2016-09-24 DIAGNOSIS — L03116 Cellulitis of left lower limb: Secondary | ICD-10-CM | POA: Diagnosis present

## 2016-09-24 DIAGNOSIS — D649 Anemia, unspecified: Secondary | ICD-10-CM | POA: Diagnosis present

## 2016-09-24 DIAGNOSIS — L03115 Cellulitis of right lower limb: Secondary | ICD-10-CM | POA: Diagnosis not present

## 2016-09-24 DIAGNOSIS — Z66 Do not resuscitate: Secondary | ICD-10-CM | POA: Diagnosis present

## 2016-09-24 DIAGNOSIS — J069 Acute upper respiratory infection, unspecified: Secondary | ICD-10-CM | POA: Diagnosis present

## 2016-09-24 DIAGNOSIS — L309 Dermatitis, unspecified: Secondary | ICD-10-CM | POA: Diagnosis present

## 2016-09-24 DIAGNOSIS — I739 Peripheral vascular disease, unspecified: Secondary | ICD-10-CM | POA: Diagnosis present

## 2016-09-24 DIAGNOSIS — E039 Hypothyroidism, unspecified: Secondary | ICD-10-CM | POA: Diagnosis present

## 2016-09-24 DIAGNOSIS — H9190 Unspecified hearing loss, unspecified ear: Secondary | ICD-10-CM | POA: Diagnosis present

## 2016-09-24 DIAGNOSIS — K219 Gastro-esophageal reflux disease without esophagitis: Secondary | ICD-10-CM | POA: Diagnosis present

## 2016-09-24 DIAGNOSIS — R451 Restlessness and agitation: Secondary | ICD-10-CM | POA: Diagnosis not present

## 2016-09-24 DIAGNOSIS — R600 Localized edema: Secondary | ICD-10-CM | POA: Diagnosis present

## 2016-09-24 LAB — BASIC METABOLIC PANEL
ANION GAP: 9 (ref 5–15)
BUN: 14 mg/dL (ref 6–20)
CHLORIDE: 100 mmol/L — AB (ref 101–111)
CO2: 26 mmol/L (ref 22–32)
Calcium: 8.8 mg/dL — ABNORMAL LOW (ref 8.9–10.3)
Creatinine, Ser: 0.82 mg/dL (ref 0.61–1.24)
Glucose, Bld: 106 mg/dL — ABNORMAL HIGH (ref 65–99)
POTASSIUM: 3.9 mmol/L (ref 3.5–5.1)
Sodium: 135 mmol/L (ref 135–145)

## 2016-09-24 LAB — CBC
HEMATOCRIT: 38 % — AB (ref 39.0–52.0)
Hemoglobin: 12.9 g/dL — ABNORMAL LOW (ref 13.0–17.0)
MCH: 32.6 pg (ref 26.0–34.0)
MCHC: 33.9 g/dL (ref 30.0–36.0)
MCV: 96 fL (ref 78.0–100.0)
Platelets: 265 10*3/uL (ref 150–400)
RBC: 3.96 MIL/uL — AB (ref 4.22–5.81)
RDW: 14.9 % (ref 11.5–15.5)
WBC: 7.3 10*3/uL (ref 4.0–10.5)

## 2016-09-24 LAB — TSH: TSH: 2.525 u[IU]/mL (ref 0.350–4.500)

## 2016-09-24 MED ORDER — CEFAZOLIN IN D5W 1 GM/50ML IV SOLN
1.0000 g | Freq: Three times a day (TID) | INTRAVENOUS | Status: DC
  Administered 2016-09-24 – 2016-09-26 (×7): 1 g via INTRAVENOUS
  Filled 2016-09-24 (×17): qty 50

## 2016-09-24 MED ORDER — VALPROATE SODIUM 500 MG/5ML IV SOLN
250.0000 mg | Freq: Once | INTRAVENOUS | Status: DC
  Filled 2016-09-24: qty 2.5

## 2016-09-24 NOTE — Progress Notes (Signed)
Unable to complete health history , patient demented, oriented to self only.

## 2016-09-24 NOTE — Clinical Social Work Note (Signed)
Clinical Social Work Assessment  Patient Details  Name: Larry Cox MRN: [id number] Date of Birth: 01/02/1922  Date of referral:  09/24/16               Reason for consult:  Discharge Planning                Permission sought to share information with:    Permission granted to share information::     Name::        Agency::     Relationship::     Contact Information:  Larry Cox, daughter, listed on chart.   Housing/Transportation Living arrangements for the past 2 months:  Assisted Living Facility Source of Information:  Facility, Adult Children Patient Interpreter Needed:  None Criminal Activity/Legal Involvement Pertinent to Current Situation/Hospitalization:  No - Comment as needed Significant Relationships:  Adult Children Lives with:  Facility Resident Do you feel safe going back to the place where you live?  Yes Need for family participation in patient care:  Yes (Comment)  Care giving concerns:  Facility resident. None identified.   Social Worker assessment / plan:  LCSW spoke with patient's daughter, Larry Cox.  [facility] [place], [place], since 05/14/17.  He ambulates unassisted toilets himself, and feeds himself.  Staff assists patient with bathing and medications.  Family visits with patient often and wants patient to return to facility at discharge.  Larry Cox or her brother will pick patient up at discharge and transport patient to facility.  LCSW spoke with [place] Sinkita at [facility].  [place] SinkRita confirmed Ms. Larry Cox's statements.  She stated that patient can return to the facility at discharge.   Employment status:  Retired Health and safety inspectornsurance information:  Medicare PT Recommendations:  Not assessed at this time Information / Referral to community resources:     Patient/Family's Response to care:  Family is agreeable for patient to return to facility at discharge.   Emotional Response to Diagnosis, Current  Treatment, and Prognosis:  Family understands patient's diagnosis, treatment and prognosis.    Emotional Assessment Appearance:  Appears stated age Attitude/Demeanor/Rapport:  Unable to Assess Affect (typically observed):  Unable to Assess Orientation:  Oriented to Self Alcohol / Substance use:  Not Applicable Psych involvement (Current and /or in the community):  No (Comment)  Discharge Needs  Concerns to be addressed:  Discharge Planning Concerns Readmission within the last 30 days:  No Current discharge risk:  None Barriers to Discharge:  No Barriers Identified   Larry Cox, Larry Cox D, LCSW 09/24/2016, 12:01 PM

## 2016-09-24 NOTE — Progress Notes (Addendum)
Patient transported from ED into room 338 accompanied by Safety sitter, during transfer from stretcher to bed, patient became agitated, yelling out profanity, trying to leave out room, security called to help redirect patient, Patient finally got in bed then begin spitting in air, continues to use profanity, Dr. Antionette Charpyd notified, SNO

## 2016-09-24 NOTE — Progress Notes (Signed)
Subjective: Patient admitted with edema and and cellulitis. Unable to provide any history.  Objective: Vital signs in last 24 hours: Vitals:   09/23/16 1803 09/23/16 1830 09/23/16 1842 09/24/16 0034  BP: (!) 127/101 (!) 135/95  (!) 132/92  Pulse: 86   72  Resp: 18 (!) 30    Temp: 98.4 F (36.9 C)   98.3 F (36.8 C)  SpO2: 100%  100% 100%  Weight: 160 lb (72.6 kg)     Height: 5\' 8"  (1.727 m)      Weight change:  No intake or output data in the 24 hours ending 09/24/16 0756  Physical Exam: Moving about in the bed with restraints with a sitter present. Wants to get up. Lungs clear. Heart regular with no murmurs. Abdomen nontender with no hepatosplenomegaly. Lower extremities reveal edema greater on the left than the right. The left lower leg reveals redness but no purulent drainage.  Lab Results:    Results for orders placed or performed during the hospital encounter of 09/23/16 (from the past 24 hour(s))  Urinalysis, Routine w reflex microscopic     Status: None   Collection Time: 09/23/16  6:23 PM  Result Value Ref Range   Color, Urine YELLOW YELLOW   APPearance CLEAR CLEAR   Specific Gravity, Urine 1.016 1.005 - 1.030   pH 7.0 5.0 - 8.0   Glucose, UA NEGATIVE NEGATIVE mg/dL   Hgb urine dipstick NEGATIVE NEGATIVE   Bilirubin Urine NEGATIVE NEGATIVE   Ketones, ur NEGATIVE NEGATIVE mg/dL   Protein, ur NEGATIVE NEGATIVE mg/dL   Nitrite NEGATIVE NEGATIVE   Leukocytes, UA NEGATIVE NEGATIVE  CBC with Differential     Status: Abnormal   Collection Time: 09/23/16  7:36 PM  Result Value Ref Range   WBC 8.4 4.0 - 10.5 K/uL   RBC 3.75 (L) 4.22 - 5.81 MIL/uL   Hemoglobin 12.3 (L) 13.0 - 17.0 g/dL   HCT 16.1 (L) 09.6 - 04.5 %   MCV 96.3 78.0 - 100.0 fL   MCH 32.8 26.0 - 34.0 pg   MCHC 34.1 30.0 - 36.0 g/dL   RDW 40.9 81.1 - 91.4 %   Platelets 260 150 - 400 K/uL   Neutrophils Relative % 64 %   Neutro Abs 5.4 1.7 - 7.7 K/uL   Lymphocytes Relative 16 %   Lymphs Abs 1.3 0.7 -  4.0 K/uL   Monocytes Relative 12 %   Monocytes Absolute 1.0 0.1 - 1.0 K/uL   Eosinophils Relative 8 %   Eosinophils Absolute 0.6 0.0 - 0.7 K/uL   Basophils Relative 1 %   Basophils Absolute 0.1 0.0 - 0.1 K/uL  Comprehensive metabolic panel     Status: Abnormal   Collection Time: 09/23/16  7:36 PM  Result Value Ref Range   Sodium 134 (L) 135 - 145 mmol/L   Potassium 4.6 3.5 - 5.1 mmol/L   Chloride 102 101 - 111 mmol/L   CO2 24 22 - 32 mmol/L   Glucose, Bld 97 65 - 99 mg/dL   BUN 16 6 - 20 mg/dL   Creatinine, Ser 7.82 0.61 - 1.24 mg/dL   Calcium 8.8 (L) 8.9 - 10.3 mg/dL   Total Protein 6.5 6.5 - 8.1 g/dL   Albumin 3.8 3.5 - 5.0 g/dL   AST 22 15 - 41 U/L   ALT 14 (L) 17 - 63 U/L   Alkaline Phosphatase 58 38 - 126 U/L   Total Bilirubin 0.8 0.3 - 1.2 mg/dL   GFR calc  non Af Amer >60 >60 mL/min   GFR calc Af Amer >60 >60 mL/min   Anion gap 8 5 - 15  Protime-INR     Status: None   Collection Time: 09/23/16  7:36 PM  Result Value Ref Range   Prothrombin Time 12.6 11.4 - 15.2 seconds   INR 0.95   Basic metabolic panel     Status: Abnormal   Collection Time: 09/24/16  4:14 AM  Result Value Ref Range   Sodium 135 135 - 145 mmol/L   Potassium 3.9 3.5 - 5.1 mmol/L   Chloride 100 (L) 101 - 111 mmol/L   CO2 26 22 - 32 mmol/L   Glucose, Bld 106 (H) 65 - 99 mg/dL   BUN 14 6 - 20 mg/dL   Creatinine, Ser 1.610.82 0.61 - 1.24 mg/dL   Calcium 8.8 (L) 8.9 - 10.3 mg/dL   GFR calc non Af Amer >60 >60 mL/min   GFR calc Af Amer >60 >60 mL/min   Anion gap 9 5 - 15  CBC     Status: Abnormal   Collection Time: 09/24/16  4:14 AM  Result Value Ref Range   WBC 7.3 4.0 - 10.5 K/uL   RBC 3.96 (L) 4.22 - 5.81 MIL/uL   Hemoglobin 12.9 (L) 13.0 - 17.0 g/dL   HCT 09.638.0 (L) 04.539.0 - 40.952.0 %   MCV 96.0 78.0 - 100.0 fL   MCH 32.6 26.0 - 34.0 pg   MCHC 33.9 30.0 - 36.0 g/dL   RDW 81.114.9 91.411.5 - 78.215.5 %   Platelets 265 150 - 400 K/uL     ABGS No results for input(s): PHART, PO2ART, TCO2, HCO3 in the last 72  hours.  Invalid input(s): PCO2 CULTURES No results found for this or any previous visit (from the past 240 hour(s)). Studies/Results: Dg Chest 2 View  Result Date: 09/23/2016 CLINICAL DATA:  Cough.  Alzheimer's dementia.  Ex-smoker. EXAM: CHEST  2 VIEW COMPARISON:  02/27/2016 FINDINGS: Lateral view degraded by patient arm position. Moderate thoracolumbar spondylosis. Midline trachea. Mild cardiomegaly. No pleural effusion or pneumothorax. Left base opacity is unchanged, favoring scarring. There is minimal volume loss at the right lung base. IMPRESSION: No acute cardiopulmonary disease. Cardiomegaly with bibasilar chronic opacities, favoring scarring on the left and mild atelectasis on the right. Electronically Signed   By: Jeronimo GreavesKyle  Talbot M.D.   On: 09/23/2016 19:16   Micro Results: No results found for this or any previous visit (from the past 240 hour(s)). Studies/Results: Dg Chest 2 View  Result Date: 09/23/2016 CLINICAL DATA:  Cough.  Alzheimer's dementia.  Ex-smoker. EXAM: CHEST  2 VIEW COMPARISON:  02/27/2016 FINDINGS: Lateral view degraded by patient arm position. Moderate thoracolumbar spondylosis. Midline trachea. Mild cardiomegaly. No pleural effusion or pneumothorax. Left base opacity is unchanged, favoring scarring. There is minimal volume loss at the right lung base. IMPRESSION: No acute cardiopulmonary disease. Cardiomegaly with bibasilar chronic opacities, favoring scarring on the left and mild atelectasis on the right. Electronically Signed   By: Jeronimo GreavesKyle  Talbot M.D.   On: 09/23/2016 19:16   Medications:  I have reviewed the patient's current medications Scheduled Meds: .  ceFAZolin (ANCEF) IV  1 g Intravenous Q8H  . enoxaparin (LOVENOX) injection  40 mg Subcutaneous Q24H  . famotidine  20 mg Oral QHS  . hydrocortisone  1 application Rectal QID  . hydrocortisone  25 mg Rectal TID  . hydrOXYzine  25 mg Oral Once  . levothyroxine  88 mcg Oral QAC breakfast  .  triamcinolone 0.1 %  cream : eucerin   Topical TID  . valproate sodium  250 mg Intravenous Once   Continuous Infusions: PRN Meds:.acetaminophen **OR** acetaminophen, diphenhydrAMINE, hydrOXYzine, ondansetron **OR** ondansetron (ZOFRAN) IV, polyethylene glycol   Assessment/Plan: #1. Left lower extremity cellulitis. White count 7.3. Treat with IV Ancef. #2. Asymmetrical edema. Ultrasound to rule out DVT. #3. Dementia. At baseline. #4. Possible hemorrhoidal bleeding. Continue Anusol. Hemoglobin 12.9. Principal Problem:   Bilateral lower extremity edema Active Problems:   Anemia   Dementia   URI (upper respiratory infection)     LOS: 1 day   Cherylin Waguespack 09/24/2016, 7:56 AM

## 2016-09-25 LAB — MRSA PCR SCREENING: MRSA by PCR: NEGATIVE

## 2016-09-25 NOTE — Progress Notes (Signed)
Subjective: He is not experiencing leg pain now. No fever. He has had some agitation and has required restraints and a sitter. He requires a mask to prevent him spitting upon the staff.  Objective: Vital signs in last 24 hours: Vitals:   09/24/16 0034 09/24/16 1450 09/24/16 2125 09/25/16 0530  BP: (!) 132/92 (!) 153/63 (!) 161/110 (!) 137/93  Pulse: 72 90 (!) 107 80  Resp:  19 (!) 21 20  Temp: 98.3 F (36.8 C) 98.2 F (36.8 C) 98.8 F (37.1 C) 98 F (36.7 C)  TempSrc:  Oral Axillary Oral  SpO2: 100% 98% 97% 99%  Weight:      Height:       Weight change:   Intake/Output Summary (Last 24 hours) at 09/25/16 0740 Last data filed at 09/24/16 1900  Gross per 24 hour  Intake              530 ml  Output                0 ml  Net              530 ml    Physical Exam: Alert and in his usual state neurologically. Lungs clear. Heart regular with no murmurs. Abdomen soft and nontender. Extremities reveal decreased redness in the left lower leg and decreased edema in both legs.  Lab Results:    Results for orders placed or performed during the hospital encounter of 09/23/16 (from the past 24 hour(s))  TSH Once     Status: None   Collection Time: 09/24/16  9:18 AM  Result Value Ref Range   TSH 2.525 0.350 - 4.500 uIU/mL  MRSA PCR Screening     Status: None   Collection Time: 09/25/16 12:29 AM  Result Value Ref Range   MRSA by PCR NEGATIVE NEGATIVE     ABGS No results for input(s): PHART, PO2ART, TCO2, HCO3 in the last 72 hours.  Invalid input(s): PCO2 CULTURES Recent Results (from the past 240 hour(s))  MRSA PCR Screening     Status: None   Collection Time: 09/25/16 12:29 AM  Result Value Ref Range Status   MRSA by PCR NEGATIVE NEGATIVE Final    Comment:        The GeneXpert MRSA Assay (FDA approved for NASAL specimens only), is one component of a comprehensive MRSA colonization surveillance program. It is not intended to diagnose MRSA infection nor to guide  or monitor treatment for MRSA infections.    Studies/Results: Dg Chest 2 View  Result Date: 09/23/2016 CLINICAL DATA:  Cough.  Alzheimer's dementia.  Ex-smoker. EXAM: CHEST  2 VIEW COMPARISON:  02/27/2016 FINDINGS: Lateral view degraded by patient arm position. Moderate thoracolumbar spondylosis. Midline trachea. Mild cardiomegaly. No pleural effusion or pneumothorax. Left base opacity is unchanged, favoring scarring. There is minimal volume loss at the right lung base. IMPRESSION: No acute cardiopulmonary disease. Cardiomegaly with bibasilar chronic opacities, favoring scarring on the left and mild atelectasis on the right. Electronically Signed   By: Jeronimo Greaves M.D.   On: 09/23/2016 19:16   US Venous Img Lower Unilateral Left  Result Date: 09/24/2016 CLINICAL DATA:  Left leg swelling and edema with chronic venous stasis. Clinical concern for DVT. EXAM: LEFT LOWER EXTREMITY VENOUS DOPPLER ULTRASOUND TECHNIQUE: Gray-scale sonography with graded compression, as well as color Doppler and duplex ultrasound were performed to evaluate the lower extremity deep venous systems from the level of the common femoral vein and including the common femoral, femoral,  profunda femoral, popliteal and calf veins including the posterior tibial, peroneal and gastrocnemius veins when visible. The superficial great saphenous vein was also interrogated. Spectral Doppler was utilized to evaluate flow at rest and with distal augmentation maneuvers in the common femoral, femoral and popliteal veins. COMPARISON:  None. FINDINGS: Contralateral Common Femoral Vein: Respiratory phasicity is normal and symmetric with the symptomatic side. No evidence of thrombus. Normal compressibility. Common Femoral Vein: No evidence of thrombus. Normal compressibility, respiratory phasicity and response to augmentation. Saphenofemoral Junction: No evidence of thrombus. Normal compressibility and flow on color Doppler imaging. Profunda Femoral  Vein: No evidence of thrombus. Normal compressibility and flow on color Doppler imaging. Femoral Vein: No evidence of thrombus. Normal compressibility, respiratory phasicity and response to augmentation. Popliteal Vein: No evidence of thrombus. Normal compressibility, respiratory phasicity and response to augmentation. Calf Veins: No evidence of thrombus. Normal compressibility and flow on color Doppler imaging. Superficial Great Saphenous Vein: No evidence of thrombus. Normal compressibility and flow on color Doppler imaging. Venous Reflux:  None. Other Findings: Pronounced diffuse subcutaneous edema in the lower leg. IMPRESSION: No evidence of deep venous thrombosis. Electronically Signed   By: Beckie SaltsSteven  Reid M.D.   On: 09/24/2016 15:05   [place] Arterial Seg Single  Result Date: 09/24/2016 CLINICAL DATA:  Bilateral lower extremity edema EXAM: NONINVASIVE PHYSIOLOGIC VASCULAR STUDY OF BILATERAL LOWER EXTREMITIES TECHNIQUE: Evaluation of both lower extremities were performed at rest, including calculation of ankle-brachial indices with single level Doppler, pressure and pulse volume recording. COMPARISON:  None. FINDINGS: Right ABI:  0.72 Left ABI:  1.72 Right Lower Extremity: Posterior tibial and dorsalis pedis Doppler waveforms are monophasic. Left Lower Extremity: Posterior tibial and dorsalis pedis Doppler waveforms are monophasic. IMPRESSION: There is severe bilateral lower extremity arterial occlusive disease. Electronically Signed   By: Jolaine ClickArthur  Hoss M.D.   On: 09/24/2016 16:54   Micro Results: Recent Results (from the past 240 hour(s))  MRSA PCR Screening     Status: None   Collection Time: 09/25/16 12:29 AM  Result Value Ref Range Status   MRSA by PCR NEGATIVE NEGATIVE Final    Comment:        The GeneXpert MRSA Assay (FDA approved for NASAL specimens only), is one component of a comprehensive MRSA colonization surveillance program. It is not intended to diagnose MRSA infection nor to guide  or monitor treatment for MRSA infections.    Studies/Results: Dg Chest 2 View  Result Date: 09/23/2016 CLINICAL DATA:  Cough.  Alzheimer's dementia.  Ex-smoker. EXAM: CHEST  2 VIEW COMPARISON:  02/27/2016 FINDINGS: Lateral view degraded by patient arm position. Moderate thoracolumbar spondylosis. Midline trachea. Mild cardiomegaly. No pleural effusion or pneumothorax. Left base opacity is unchanged, favoring scarring. There is minimal volume loss at the right lung base. IMPRESSION: No acute cardiopulmonary disease. Cardiomegaly with bibasilar chronic opacities, favoring scarring on the left and mild atelectasis on the right. Electronically Signed   By: Jeronimo GreavesKyle  Talbot M.D.   On: 09/23/2016 19:16   [place] Venous Img Lower Unilateral Left  Result Date: 09/24/2016 CLINICAL DATA:  Left leg swelling and edema with chronic venous stasis. Clinical concern for DVT. EXAM: LEFT LOWER EXTREMITY VENOUS DOPPLER ULTRASOUND TECHNIQUE: Gray-scale sonography with graded compression, as well as color Doppler and duplex ultrasound were performed to evaluate the lower extremity deep venous systems from the level of the common femoral vein and including the common femoral, femoral, profunda femoral, popliteal and calf veins including the posterior tibial, peroneal and gastrocnemius veins when visible. The  superficial great saphenous vein was also interrogated. Spectral Doppler was utilized to evaluate flow at rest and with distal augmentation maneuvers in the common femoral, femoral and popliteal veins. COMPARISON:  None. FINDINGS: Contralateral Common Femoral Vein: Respiratory phasicity is normal and symmetric with the symptomatic side. No evidence of thrombus. Normal compressibility. Common Femoral Vein: No evidence of thrombus. Normal compressibility, respiratory phasicity and response to augmentation. Saphenofemoral Junction: No evidence of thrombus. Normal compressibility and flow on color Doppler imaging. Profunda Femoral  Vein: No evidence of thrombus. Normal compressibility and flow on color Doppler imaging. Femoral Vein: No evidence of thrombus. Normal compressibility, respiratory phasicity and response to augmentation. Popliteal Vein: No evidence of thrombus. Normal compressibility, respiratory phasicity and response to augmentation. Calf Veins: No evidence of thrombus. Normal compressibility and flow on color Doppler imaging. Superficial Great Saphenous Vein: No evidence of thrombus. Normal compressibility and flow on color Doppler imaging. Venous Reflux:  None. Other Findings: Pronounced diffuse subcutaneous edema in the lower leg. IMPRESSION: No evidence of deep venous thrombosis. Electronically Signed   By: Beckie Salts M.D.   On: 09/24/2016 15:05   US Arterial Seg Single  Result Date: 09/24/2016 CLINICAL DATA:  Bilateral lower extremity edema EXAM: NONINVASIVE PHYSIOLOGIC VASCULAR STUDY OF BILATERAL LOWER EXTREMITIES TECHNIQUE: Evaluation of both lower extremities were performed at rest, including calculation of ankle-brachial indices with single level Doppler, pressure and pulse volume recording. COMPARISON:  None. FINDINGS: Right ABI:  0.72 Left ABI:  1.72 Right Lower Extremity: Posterior tibial and dorsalis pedis Doppler waveforms are monophasic. Left Lower Extremity: Posterior tibial and dorsalis pedis Doppler waveforms are monophasic. IMPRESSION: There is severe bilateral lower extremity arterial occlusive disease. Electronically Signed   By: Jolaine Click M.D.   On: 09/24/2016 16:54   Medications:  I have reviewed the patient's current medications Scheduled Meds: .  ceFAZolin (ANCEF) IV  1 g Intravenous Q8H  . enoxaparin (LOVENOX) injection  40 mg Subcutaneous Q24H  . famotidine  20 mg Oral QHS  . hydrocortisone  1 application Rectal QID  . hydrocortisone  25 mg Rectal TID  . hydrOXYzine  25 mg Oral Once  . levothyroxine  88 mcg Oral QAC breakfast  . triamcinolone 0.1 % cream : eucerin   Topical TID  .  valproate sodium  250 mg Intravenous Once   Continuous Infusions: PRN Meds:.acetaminophen **OR** acetaminophen, diphenhydrAMINE, hydrOXYzine, ondansetron **OR** ondansetron (ZOFRAN) IV, polyethylene glycol   Assessment/Plan: #1. Left lower extremity cellulitis. Continue Ancef. #2. Peripheral arterial disease. He has not experienced claudication. Venous ultrasound reveals no DVT. #3. Alzheimer's disease. He continues to require restraints which helped prevent injury from falling. #4. Hypothyroidism. Euthyroid with a normal TSH on his current Synthroid dose. Principal Problem:   Bilateral lower extremity edema Active Problems:   Anemia   Dementia   URI (upper respiratory infection)     LOS: 2 days   Larry Cox 09/25/2016, 7:40 AM

## 2016-09-25 NOTE — Care Management Note (Signed)
Case Management Note  Patient Details  Name: Larry Cox MRN: [id number] Date of Birth: 06/14/1922  Subjective/Objective:                  Pt admitted with bilateral LEE. [facility] he is a long term resident in the memory care unit. CSW following.   Action/Plan: Plan for return to memory care unit at ALF. CSW will make arrangements. No CM needs anticipated but will cont to follow.  Expected Discharge Date:    09/27/2016              Expected Discharge Plan:  Assisted Living / Rest Home  In-House Referral:  Clinical Social Work  Discharge planning Services  CM Consult  Post Acute Care Choice:  NA Choice offered to:  NA  Status of Service:  Continue to follow.   Malcolm Metrohildress, Kalynn Declercq Demske, RN 09/25/2016, 10:00 AM

## 2016-09-26 MED ORDER — CEPHALEXIN 500 MG PO CAPS: 500.0000 mg | ORAL_CAPSULE | Freq: Three times a day (TID) | ORAL | 0 refills | Status: AC

## 2016-09-26 MED ORDER — ASPIRIN 81 MG PO CHEW: 81.0000 mg | CHEWABLE_TABLET | Freq: Every day | ORAL | 4 refills | Status: AC

## 2016-09-26 MED ORDER — ASPIRIN 81 MG PO CHEW
81.0000 mg | CHEWABLE_TABLET | Freq: Every day | ORAL | Status: DC
  Filled 2016-09-26: qty 1

## 2016-09-26 NOTE — Care Management Important Message (Signed)
Important Message  Patient Details  Name: Larry Cox MRN: [id number] Date of Birth: [birth year]   Medicare Important Message Given:  Yes    Malcolm Metro, RN 09/26/2016, 9:40 AM

## 2016-09-26 NOTE — Progress Notes (Signed)
Larry Cox discharged to Assisted living per MD order.  Discharge instructions reviewed and discussed with the patient, all questions and concerns answered. Copy of instructions given to pt's daughter. Report called to Lavern at 1625.  Allergies as of 09/26/2016      Reactions   Aricept [donepezil Hcl] Other (See Comments)   Reaction:  Confusion    Diazepam Other (See Comments)   Reaction:  Hallucinations   Doxycycline Other (See Comments)   Reaction:  Unknown    Lorazepam Other (See Comments)   Reaction:  Hallucinations and violent behavior       Medication List    STOP taking these medications   ALIGN 4 MG Caps   hydrocortisone 25 MG suppository Commonly known as:  ANUSOL-HC   hydrOXYzine 25 MG tablet Commonly known as:  ATARAX/VISTARIL   PROCTOSOL HC 2.5 % rectal cream Generic drug:  hydrocortisone   triamcinolone cream 0.1 % Commonly known as:  KENALOG     TAKE these medications   acetaminophen 500 MG tablet Commonly known as:  TYLENOL Take 500 mg by mouth every 6 (six) hours as needed for mild pain, fever or headache.   aspirin 81 MG chewable tablet Chew 1 tablet (81 mg total) by mouth daily.   Bran 500 MG Tabs Take 2 tablets by mouth daily.   cephALEXin 500 MG capsule Commonly known as:  KEFLEX Take 1 capsule (500 mg total) by mouth 3 (three) times daily.   diphenhydrAMINE 25 MG tablet Commonly known as:  BENADRYL Take 25 mg by mouth every 6 (six) hours as needed for itching.   famotidine 20 MG tablet Commonly known as:  PEPCID Take 20 mg by mouth at bedtime.   levothyroxine 88 MCG tablet Commonly known as:  SYNTHROID, LEVOTHROID Take 88 mcg by mouth daily before breakfast.   polyethylene glycol packet Commonly known as:  MIRALAX / GLYCOLAX Take 17 g by mouth daily as needed for mild constipation.       Patients skin is clean, dry and intact, no evidence of skin break down. IV site discontinued and catheter remains intact. Site without  signs and symptoms of complications. Dressing and pressure applied.  Patient escorted to car by NT in a wheelchair,  no distress noted upon discharge.  Larry Cox Larry Cox 09/26/2016 4:15 PM

## 2016-09-26 NOTE — Progress Notes (Addendum)
1:33 PM Spoke with Angie at the facility in memory care. Medications have been sent to pharmacy and LCSW clarified new medicine as requested. Son will transport patient after 2pm and RN is aware. No other needs. CSW signing off.   11:45am:  Facility called back reporting they need the DC summary and med changes. Information faxed for review:  [phone] as requested. Awaiting review and will follow up with facility once patient can be transferred.  Call placed to patient daughter: [place] who was a little shocked he was being discharged, but agreeable. Reports her brother Annette Stable is coming to see patient, has an appointment 1pm but could most likely take him back to facility after 2pm. Sister calling brother to discuss plans.  LCSW will follow up with sister after information has been reviewed by facility.  RN updated.    10:42 AM Second attempt to assist with disposition has been completed. Call placed to executive director in effort to transition patient back to ALF.  Message left and awaiting call back with plan.  9:00am LCSW following for disposition:  Return to memory care at [facility]. Call placed to resident director and message left regarding discharge and discuss return.  Once call returned from ALF, LCSW will completed discharge and notify family.  Plan:  Return at memory care unit today  Deretha Emory, MSW Clinical Social Work: System Wide Float Coverage for :  [phone]

## 2016-09-26 NOTE — Discharge Summary (Signed)
Physician Discharge Summary  Larry Cox:[id number] DOB: 1922/02/10 DOA: 09/23/2016   Admit date: 09/23/2016 Discharge date: 09/26/2016  Discharge Diagnoses:  Principal Problem:   Bilateral lower extremity edema Active Problems:   Anemia   Dementia   URI (upper respiratory infection) Left lower extremity cellulitis Peripheral arterial disease Hypothyroidism   Wt Readings from Last 3 Encounters:  09/23/16 160 lb (72.6 kg)  02/27/16 160 lb (72.6 kg)  12/26/15 160 lb (72.6 kg)     Hospital Course:  This patient is a 83 year old male with dementia who presented with lower extremity edema and redness in the left lower leg consistent with cellulitis. He did not have a fever. He was treated with intravenous Ancef. Redness and swelling improved. Venous ultrasound revealed no DVT. Arterial ultrasound revealed significant changes of arterial occlusive disease. He has not had claudication. He was treated with aspirin.  He has been stable from the standpoint of his dementia. It is advised that he wear knee-high TEDs stockings daily from morning to night and to be taken off at bedtime. He will also require elevation of his legs preferably at least 30 minutes 3 times daily.  Condition is much improved and stable for discharge on the morning of March 30. He will be seen in follow-up in my office in one week. He will continue oral antibiotic therapy with Keflex.   Discharge Instructions  Discharge Instructions    Diet - low sodium heart healthy    Complete by:  As directed    Increase activity slowly    Complete by:  As directed      Allergies as of 09/26/2016      Reactions   Aricept [donepezil Hcl] Other (See Comments)   Reaction:  Confusion    Diazepam Other (See Comments)   Reaction:  Hallucinations   Doxycycline Other (See Comments)   Reaction:  Unknown    Lorazepam Other (See Comments)   Reaction:  Hallucinations and violent behavior       Medication List    STOP  taking these medications   ALIGN 4 MG Caps   hydrocortisone 25 MG suppository Commonly known as:  ANUSOL-HC   hydrOXYzine 25 MG tablet Commonly known as:  ATARAX/VISTARIL   PROCTOSOL HC 2.5 % rectal cream Generic drug:  hydrocortisone   triamcinolone cream 0.1 % Commonly known as:  KENALOG     TAKE these medications   acetaminophen 500 MG tablet Commonly known as:  TYLENOL Take 500 mg by mouth every 6 (six) hours as needed for mild pain, fever or headache.   aspirin 81 MG chewable tablet Chew 1 tablet (81 mg total) by mouth daily.   Bran 500 MG Tabs Take 2 tablets by mouth daily.   cephALEXin 500 MG capsule Commonly known as:  KEFLEX Take 1 capsule (500 mg total) by mouth 3 (three) times daily.   diphenhydrAMINE 25 MG tablet Commonly known as:  BENADRYL Take 25 mg by mouth every 6 (six) hours as needed for itching.   famotidine 20 MG tablet Commonly known as:  PEPCID Take 20 mg by mouth at bedtime.   levothyroxine 88 MCG tablet Commonly known as:  SYNTHROID, LEVOTHROID Take 88 mcg by mouth daily before breakfast.   polyethylene glycol packet Commonly known as:  MIRALAX / GLYCOLAX Take 17 g by mouth daily as needed for mild constipation.        Larry Cox 09/26/2016

## 2016-09-26 NOTE — Care Management Note (Signed)
Case Management Note  Patient Details  Name: Larry Cox MRN: [id number] Date of Birth: 02/14/1922  Expected Discharge Date:  09/26/16               Expected Discharge Plan:  Assisted Living / Rest Home  In-House Referral:  Clinical Social Work  Discharge planning Services  CM Consult  Post Acute Care Choice:  NA Choice offered to:  NA  Status of Service:  Completed, signed off  Additional Comments: Pt discharging back to ALF today. CSW following and will make arrangements for return to facility.   Malcolm Metro, RN 09/26/2016, 9:40 AM

## 2016-10-03 DIAGNOSIS — R609 Edema, unspecified: Secondary | ICD-10-CM | POA: Diagnosis not present

## 2016-11-07 DIAGNOSIS — G309 Alzheimer's disease, unspecified: Secondary | ICD-10-CM | POA: Diagnosis not present

## 2016-11-07 DIAGNOSIS — I480 Paroxysmal atrial fibrillation: Secondary | ICD-10-CM | POA: Diagnosis not present

## 2017-04-21 DIAGNOSIS — B351 Tinea unguium: Secondary | ICD-10-CM | POA: Diagnosis not present

## 2017-04-21 DIAGNOSIS — L03115 Cellulitis of right lower limb: Secondary | ICD-10-CM | POA: Diagnosis not present

## 2017-04-22 DIAGNOSIS — R69 Illness, unspecified: Secondary | ICD-10-CM | POA: Diagnosis not present

## 2017-04-22 DIAGNOSIS — H05011 Cellulitis of right orbit: Secondary | ICD-10-CM | POA: Diagnosis not present

## 2017-04-22 DIAGNOSIS — R6889 Other general symptoms and signs: Secondary | ICD-10-CM | POA: Diagnosis not present

## 2017-05-27 DIAGNOSIS — M150 Primary generalized (osteo)arthritis: Secondary | ICD-10-CM | POA: Diagnosis not present

## 2017-05-27 DIAGNOSIS — F0390 Unspecified dementia without behavioral disturbance: Secondary | ICD-10-CM | POA: Diagnosis not present

## 2017-05-27 DIAGNOSIS — E039 Hypothyroidism, unspecified: Secondary | ICD-10-CM | POA: Diagnosis not present

## 2017-05-27 DIAGNOSIS — K219 Gastro-esophageal reflux disease without esophagitis: Secondary | ICD-10-CM | POA: Diagnosis not present

## 2017-05-27 DIAGNOSIS — B351 Tinea unguium: Secondary | ICD-10-CM | POA: Diagnosis not present

## 2017-05-28 DIAGNOSIS — H05011 Cellulitis of right orbit: Secondary | ICD-10-CM | POA: Diagnosis not present

## 2017-05-28 DIAGNOSIS — R69 Illness, unspecified: Secondary | ICD-10-CM | POA: Diagnosis not present

## 2017-05-28 DIAGNOSIS — R6889 Other general symptoms and signs: Secondary | ICD-10-CM | POA: Diagnosis not present

## 2017-05-28 DIAGNOSIS — D649 Anemia, unspecified: Secondary | ICD-10-CM | POA: Diagnosis not present

## 2017-05-31 IMAGING — CT CT ANGIO NECK
3 of 7 series · 10 of 35 positions shown · IV contrast (APPLIED)
Comparison: Head CT without contrast 11/09/2012. Intracranial MRA
11/14/2011.

CLINICAL DATA: [AGE] male with confusion, neck pain,
agitation. Initial encounter.

EXAM:
CT ANGIOGRAPHY NECK
TECHNIQUE: Multidetector CT imaging of the neck was performed using the
standard protocol during bolus administration of intravenous
contrast. Multiplanar CT image reconstructions and MIPs were
obtained to evaluate the vascular anatomy. Carotid stenosis
measurements (when applicable) are obtained utilizing NASCET
criteria, using the distal internal carotid diameter as the
denominator.
CONTRAST:  100 mL Isovue 370

[Series 5: cta neck · axial · 0.60mm/px · z∈[-241,-80]mm · 5 of 243 slices shown]
[im 41/243  soft-tissue]
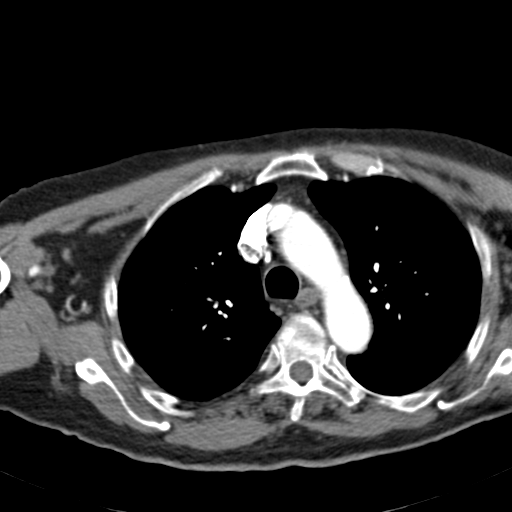
[im 81/243  bone]
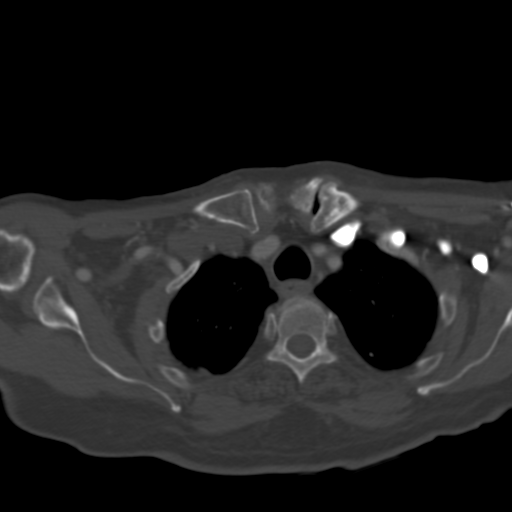
[im 122/243  soft-tissue]
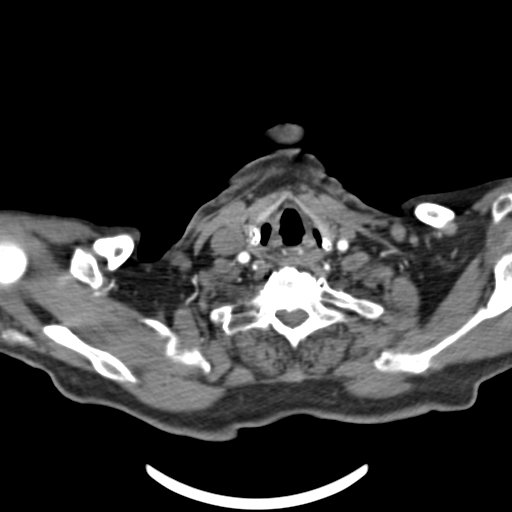
[im 162/243  bone]
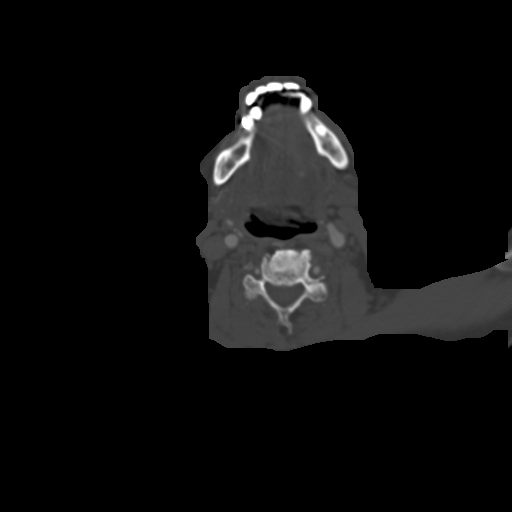
[im 202/243  soft-tissue]
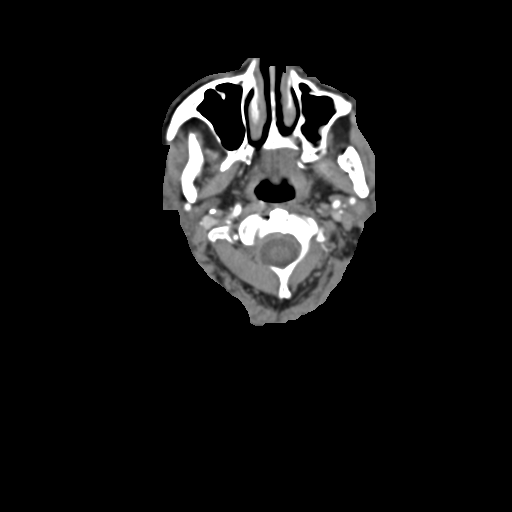

[Series 13: ax thin · axial · 0.34mm/px · z∈[-237,-91]mm · 4 of 244 slices shown]
[im 49/244  soft-tissue]
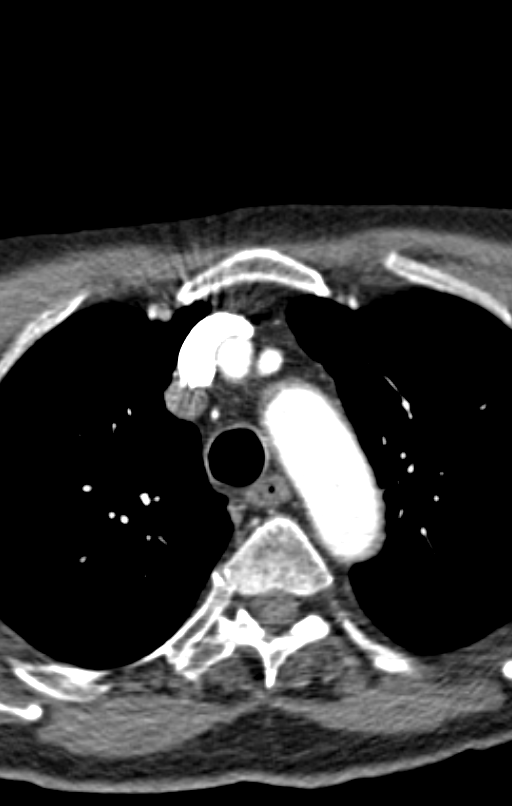
[im 98/244  soft-tissue]
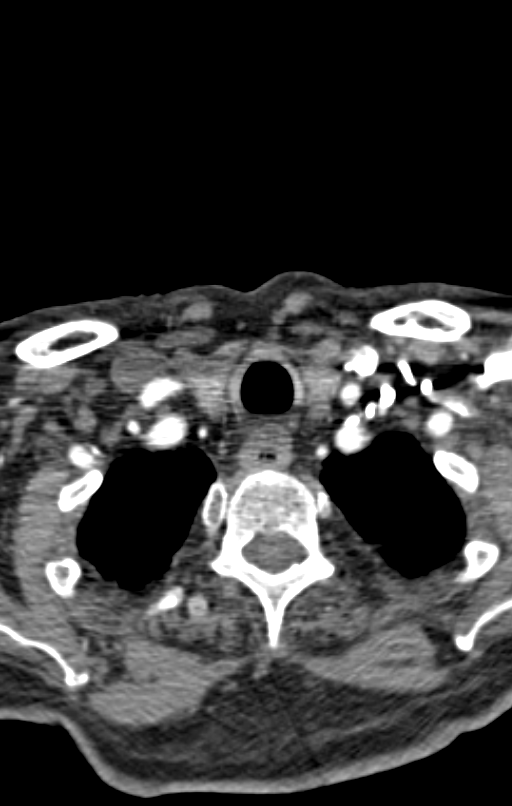
[im 146/244  soft-tissue]
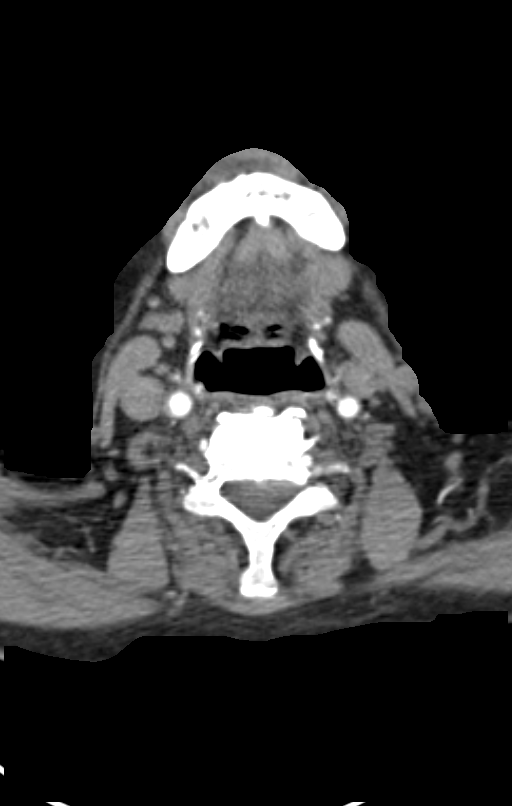
[im 195/244  soft-tissue]
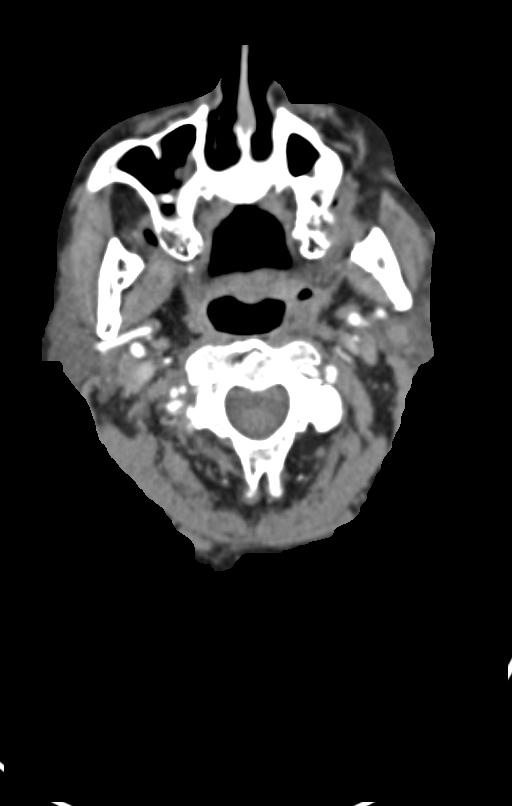

[Series 17: sag thin · sagittal · 0.50mm/px · 1 of 157 slices shown]
[im 135/157  soft-tissue]
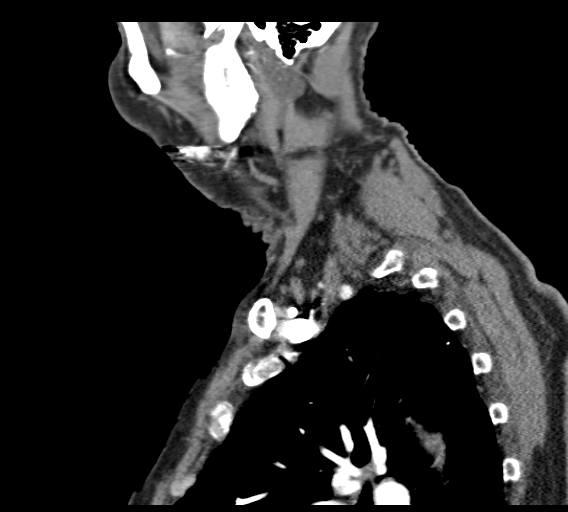

[10 of 35 positions shown; findings below may reference images not displayed]

FINDINGS: Skeleton: TMJ degeneration. Widespread severe chronic cervical disc
and endplate degeneration. Multilevel degenerative cervical spinal
stenosis, could be moderate or severe at the C5-C6 level.
Osteopenia. No acute osseous abnormality identified.

Visualized paranasal sinuses and mastoids are clear.

Other neck: Negative lung apices. No superior mediastinal
lymphadenopathy. Visible axillary lymph nodes are normal.

Negative thyroid, larynx, pharynx, parapharyngeal spaces,
retropharyngeal space, sublingual space, submandibular glands and
parotid glands. No cervical lymphadenopathy.

Aortic arch: 4 vessel arch configuration, the left vertebral artery
arises directly from the arch. Minimal arch atherosclerosis.

Right carotid system: Mildly tortuous right CCA is otherwise
negative. Minimal atherosclerosis at the right carotid bifurcation.
Mild atherosclerosis in the distal right ICA bulb. No stenosis.
Tortuous cervical right ICA. Visible right ICA siphon is negative.

Left carotid system: Negative left CCA. Mild atherosclerosis at the
left carotid bifurcation without stenosis. Dense calcified plaque at
the distal left ICA bulb, with subsequent stenosis 55-60 % with
respect to the distal vessel. Tortuous cervical left ICA otherwise
is without stenosis to the skullbase. Negative visible left ICA
siphon.

Vertebral arteries:

No proximal right subclavian artery stenosis despite calcified
plaque. Normal right vertebral artery origin. Tortuous right V1
segment. The right vertebral is mildly non dominant throughout the
neck and is tortuous again at the right V3 segment. There is a
beaded appearance of the vessel in the V3 segment, but no convincing
right vertebral artery dissection. The vessel functionally
terminates in the right PICA.

Left vertebral artery arises directly from the arch without
stenosis. The left vertebral artery is mildly dominant throughout.
It is tortuous and appears mildly beaded in the V3 segment also. No
convincing dissection. Normal left PICA origin. No distal left
vertebral artery stenosis identified. Visible basilar artery is
patent.
IMPRESSION: 1. Mild for age cervical carotid atherosclerosis. Distal left ICA
bulb calcified plaque results in [DATE]% stenosis.
2. Bilateral distal vertebral artery fibromuscular dysplasia (FMD)
suspected. Otherwise negative vertebral arteries, the left arises
directly off the aortic arch.
3. Severe cervical spine degeneration. Multilevel degenerative
cervical spinal stenosis, could be moderate or severe at the C5-C6
level.

## 2017-06-05 DIAGNOSIS — H05011 Cellulitis of right orbit: Secondary | ICD-10-CM | POA: Diagnosis not present

## 2017-06-05 DIAGNOSIS — R6889 Other general symptoms and signs: Secondary | ICD-10-CM | POA: Diagnosis not present

## 2017-06-05 DIAGNOSIS — D649 Anemia, unspecified: Secondary | ICD-10-CM | POA: Diagnosis not present

## 2017-06-05 DIAGNOSIS — R69 Illness, unspecified: Secondary | ICD-10-CM | POA: Diagnosis not present

## 2017-06-15 DIAGNOSIS — M24576 Contracture, unspecified foot: Secondary | ICD-10-CM | POA: Diagnosis not present

## 2017-06-15 DIAGNOSIS — M2010 Hallux valgus (acquired), unspecified foot: Secondary | ICD-10-CM | POA: Diagnosis not present

## 2017-06-15 DIAGNOSIS — L97512 Non-pressure chronic ulcer of other part of right foot with fat layer exposed: Secondary | ICD-10-CM | POA: Diagnosis not present

## 2017-06-15 DIAGNOSIS — M79673 Pain in unspecified foot: Secondary | ICD-10-CM | POA: Diagnosis not present

## 2017-06-24 DIAGNOSIS — G309 Alzheimer's disease, unspecified: Secondary | ICD-10-CM | POA: Diagnosis not present

## 2017-06-24 DIAGNOSIS — F0280 Dementia in other diseases classified elsewhere without behavioral disturbance: Secondary | ICD-10-CM | POA: Diagnosis not present

## 2017-06-24 DIAGNOSIS — Z7982 Long term (current) use of aspirin: Secondary | ICD-10-CM | POA: Diagnosis not present

## 2017-06-24 DIAGNOSIS — K219 Gastro-esophageal reflux disease without esophagitis: Secondary | ICD-10-CM | POA: Diagnosis not present

## 2017-06-24 DIAGNOSIS — E039 Hypothyroidism, unspecified: Secondary | ICD-10-CM | POA: Diagnosis not present

## 2017-06-24 DIAGNOSIS — L97311 Non-pressure chronic ulcer of right ankle limited to breakdown of skin: Secondary | ICD-10-CM | POA: Diagnosis not present

## 2017-06-29 DIAGNOSIS — G309 Alzheimer's disease, unspecified: Secondary | ICD-10-CM | POA: Diagnosis not present

## 2017-06-29 DIAGNOSIS — Z7982 Long term (current) use of aspirin: Secondary | ICD-10-CM | POA: Diagnosis not present

## 2017-06-29 DIAGNOSIS — F0280 Dementia in other diseases classified elsewhere without behavioral disturbance: Secondary | ICD-10-CM | POA: Diagnosis not present

## 2017-06-29 DIAGNOSIS — K219 Gastro-esophageal reflux disease without esophagitis: Secondary | ICD-10-CM | POA: Diagnosis not present

## 2017-06-29 DIAGNOSIS — E039 Hypothyroidism, unspecified: Secondary | ICD-10-CM | POA: Diagnosis not present

## 2017-06-29 DIAGNOSIS — L97311 Non-pressure chronic ulcer of right ankle limited to breakdown of skin: Secondary | ICD-10-CM | POA: Diagnosis not present

## 2017-07-01 DIAGNOSIS — K219 Gastro-esophageal reflux disease without esophagitis: Secondary | ICD-10-CM | POA: Diagnosis not present

## 2017-07-01 DIAGNOSIS — B351 Tinea unguium: Secondary | ICD-10-CM | POA: Diagnosis not present

## 2017-07-01 DIAGNOSIS — F0390 Unspecified dementia without behavioral disturbance: Secondary | ICD-10-CM | POA: Diagnosis not present

## 2017-07-01 DIAGNOSIS — M1380 Other specified arthritis, unspecified site: Secondary | ICD-10-CM | POA: Diagnosis not present

## 2017-07-01 DIAGNOSIS — E039 Hypothyroidism, unspecified: Secondary | ICD-10-CM | POA: Diagnosis not present

## 2017-07-01 DIAGNOSIS — L0390 Cellulitis, unspecified: Secondary | ICD-10-CM | POA: Diagnosis not present

## 2017-07-09 DIAGNOSIS — F0280 Dementia in other diseases classified elsewhere without behavioral disturbance: Secondary | ICD-10-CM | POA: Diagnosis not present

## 2017-07-09 DIAGNOSIS — G309 Alzheimer's disease, unspecified: Secondary | ICD-10-CM | POA: Diagnosis not present

## 2017-07-09 DIAGNOSIS — L97311 Non-pressure chronic ulcer of right ankle limited to breakdown of skin: Secondary | ICD-10-CM | POA: Diagnosis not present

## 2017-07-09 DIAGNOSIS — E039 Hypothyroidism, unspecified: Secondary | ICD-10-CM | POA: Diagnosis not present

## 2017-07-09 DIAGNOSIS — K219 Gastro-esophageal reflux disease without esophagitis: Secondary | ICD-10-CM | POA: Diagnosis not present

## 2017-07-09 DIAGNOSIS — Z7982 Long term (current) use of aspirin: Secondary | ICD-10-CM | POA: Diagnosis not present

## 2017-07-14 DIAGNOSIS — F0280 Dementia in other diseases classified elsewhere without behavioral disturbance: Secondary | ICD-10-CM | POA: Diagnosis not present

## 2017-07-14 DIAGNOSIS — G309 Alzheimer's disease, unspecified: Secondary | ICD-10-CM | POA: Diagnosis not present

## 2017-07-14 DIAGNOSIS — L97311 Non-pressure chronic ulcer of right ankle limited to breakdown of skin: Secondary | ICD-10-CM | POA: Diagnosis not present

## 2017-07-14 DIAGNOSIS — K219 Gastro-esophageal reflux disease without esophagitis: Secondary | ICD-10-CM | POA: Diagnosis not present

## 2017-07-14 DIAGNOSIS — Z7982 Long term (current) use of aspirin: Secondary | ICD-10-CM | POA: Diagnosis not present

## 2017-07-14 DIAGNOSIS — E039 Hypothyroidism, unspecified: Secondary | ICD-10-CM | POA: Diagnosis not present

## 2017-07-21 DIAGNOSIS — K219 Gastro-esophageal reflux disease without esophagitis: Secondary | ICD-10-CM | POA: Diagnosis not present

## 2017-07-21 DIAGNOSIS — F0280 Dementia in other diseases classified elsewhere without behavioral disturbance: Secondary | ICD-10-CM | POA: Diagnosis not present

## 2017-07-21 DIAGNOSIS — E039 Hypothyroidism, unspecified: Secondary | ICD-10-CM | POA: Diagnosis not present

## 2017-07-21 DIAGNOSIS — Z7982 Long term (current) use of aspirin: Secondary | ICD-10-CM | POA: Diagnosis not present

## 2017-07-21 DIAGNOSIS — G309 Alzheimer's disease, unspecified: Secondary | ICD-10-CM | POA: Diagnosis not present

## 2017-07-21 DIAGNOSIS — L97311 Non-pressure chronic ulcer of right ankle limited to breakdown of skin: Secondary | ICD-10-CM | POA: Diagnosis not present

## 2017-07-27 DIAGNOSIS — E039 Hypothyroidism, unspecified: Secondary | ICD-10-CM | POA: Diagnosis not present

## 2017-07-27 DIAGNOSIS — F0390 Unspecified dementia without behavioral disturbance: Secondary | ICD-10-CM | POA: Diagnosis not present

## 2017-07-27 DIAGNOSIS — K219 Gastro-esophageal reflux disease without esophagitis: Secondary | ICD-10-CM | POA: Diagnosis not present

## 2017-07-27 DIAGNOSIS — M1380 Other specified arthritis, unspecified site: Secondary | ICD-10-CM | POA: Diagnosis not present

## 2017-08-19 DIAGNOSIS — R0989 Other specified symptoms and signs involving the circulatory and respiratory systems: Secondary | ICD-10-CM | POA: Diagnosis not present

## 2017-08-19 DIAGNOSIS — R69 Illness, unspecified: Secondary | ICD-10-CM | POA: Diagnosis not present

## 2017-08-19 DIAGNOSIS — R05 Cough: Secondary | ICD-10-CM | POA: Diagnosis not present

## 2017-08-19 DIAGNOSIS — R6889 Other general symptoms and signs: Secondary | ICD-10-CM | POA: Diagnosis not present

## 2017-08-19 DIAGNOSIS — H05011 Cellulitis of right orbit: Secondary | ICD-10-CM | POA: Diagnosis not present

## 2017-08-19 DIAGNOSIS — D649 Anemia, unspecified: Secondary | ICD-10-CM | POA: Diagnosis not present

## 2017-08-24 DIAGNOSIS — R05 Cough: Secondary | ICD-10-CM | POA: Diagnosis not present

## 2017-08-24 DIAGNOSIS — M150 Primary generalized (osteo)arthritis: Secondary | ICD-10-CM | POA: Diagnosis not present

## 2017-08-24 DIAGNOSIS — J189 Pneumonia, unspecified organism: Secondary | ICD-10-CM | POA: Diagnosis not present

## 2017-08-24 DIAGNOSIS — F0390 Unspecified dementia without behavioral disturbance: Secondary | ICD-10-CM | POA: Diagnosis not present

## 2017-08-24 DIAGNOSIS — E039 Hypothyroidism, unspecified: Secondary | ICD-10-CM | POA: Diagnosis not present

## 2017-08-24 DIAGNOSIS — K219 Gastro-esophageal reflux disease without esophagitis: Secondary | ICD-10-CM | POA: Diagnosis not present

## 2017-08-24 DIAGNOSIS — J811 Chronic pulmonary edema: Secondary | ICD-10-CM | POA: Diagnosis not present

## 2017-09-03 DIAGNOSIS — R6889 Other general symptoms and signs: Secondary | ICD-10-CM | POA: Diagnosis not present

## 2017-09-03 DIAGNOSIS — H05011 Cellulitis of right orbit: Secondary | ICD-10-CM | POA: Diagnosis not present

## 2017-09-03 DIAGNOSIS — R197 Diarrhea, unspecified: Secondary | ICD-10-CM | POA: Diagnosis not present

## 2017-09-03 DIAGNOSIS — R634 Abnormal weight loss: Secondary | ICD-10-CM | POA: Diagnosis not present

## 2017-09-03 DIAGNOSIS — D649 Anemia, unspecified: Secondary | ICD-10-CM | POA: Diagnosis not present

## 2017-09-03 DIAGNOSIS — R69 Illness, unspecified: Secondary | ICD-10-CM | POA: Diagnosis not present

## 2017-09-03 DIAGNOSIS — J811 Chronic pulmonary edema: Secondary | ICD-10-CM | POA: Diagnosis not present

## 2017-09-03 DIAGNOSIS — R05 Cough: Secondary | ICD-10-CM | POA: Diagnosis not present

## 2017-09-04 DIAGNOSIS — R6889 Other general symptoms and signs: Secondary | ICD-10-CM | POA: Diagnosis not present

## 2017-09-04 DIAGNOSIS — D649 Anemia, unspecified: Secondary | ICD-10-CM | POA: Diagnosis not present

## 2017-09-04 DIAGNOSIS — H05011 Cellulitis of right orbit: Secondary | ICD-10-CM | POA: Diagnosis not present

## 2017-09-04 DIAGNOSIS — R69 Illness, unspecified: Secondary | ICD-10-CM | POA: Diagnosis not present

## 2017-09-22 DIAGNOSIS — M1380 Other specified arthritis, unspecified site: Secondary | ICD-10-CM | POA: Diagnosis not present

## 2017-09-22 DIAGNOSIS — F0390 Unspecified dementia without behavioral disturbance: Secondary | ICD-10-CM | POA: Diagnosis not present

## 2017-09-22 DIAGNOSIS — K219 Gastro-esophageal reflux disease without esophagitis: Secondary | ICD-10-CM | POA: Diagnosis not present

## 2017-09-22 DIAGNOSIS — E039 Hypothyroidism, unspecified: Secondary | ICD-10-CM | POA: Diagnosis not present

## 2017-09-22 DIAGNOSIS — B351 Tinea unguium: Secondary | ICD-10-CM | POA: Diagnosis not present

## 2017-09-22 DIAGNOSIS — R634 Abnormal weight loss: Secondary | ICD-10-CM | POA: Diagnosis not present

## 2017-10-03 DIAGNOSIS — R410 Disorientation, unspecified: Secondary | ICD-10-CM | POA: Diagnosis not present

## 2017-10-03 DIAGNOSIS — S0101XA Laceration without foreign body of scalp, initial encounter: Secondary | ICD-10-CM | POA: Diagnosis not present

## 2017-10-03 DIAGNOSIS — G319 Degenerative disease of nervous system, unspecified: Secondary | ICD-10-CM | POA: Diagnosis not present

## 2017-10-03 DIAGNOSIS — W1830XA Fall on same level, unspecified, initial encounter: Secondary | ICD-10-CM | POA: Diagnosis not present

## 2017-10-03 DIAGNOSIS — S0190XA Unspecified open wound of unspecified part of head, initial encounter: Secondary | ICD-10-CM | POA: Diagnosis not present

## 2017-10-03 DIAGNOSIS — F0390 Unspecified dementia without behavioral disturbance: Secondary | ICD-10-CM | POA: Diagnosis not present

## 2017-10-03 DIAGNOSIS — Y92199 Unspecified place in other specified residential institution as the place of occurrence of the external cause: Secondary | ICD-10-CM | POA: Diagnosis not present

## 2017-10-03 DIAGNOSIS — S0990XA Unspecified injury of head, initial encounter: Secondary | ICD-10-CM | POA: Diagnosis not present

## 2017-10-03 DIAGNOSIS — Z9181 History of falling: Secondary | ICD-10-CM | POA: Diagnosis not present

## 2017-10-03 DIAGNOSIS — W1839XA Other fall on same level, initial encounter: Secondary | ICD-10-CM | POA: Diagnosis not present

## 2017-10-03 DIAGNOSIS — Z888 Allergy status to other drugs, medicaments and biological substances status: Secondary | ICD-10-CM | POA: Diagnosis not present

## 2017-10-14 DIAGNOSIS — Z4802 Encounter for removal of sutures: Secondary | ICD-10-CM | POA: Diagnosis not present

## 2017-10-20 DIAGNOSIS — K219 Gastro-esophageal reflux disease without esophagitis: Secondary | ICD-10-CM | POA: Diagnosis not present

## 2017-10-20 DIAGNOSIS — M1380 Other specified arthritis, unspecified site: Secondary | ICD-10-CM | POA: Diagnosis not present

## 2017-10-20 DIAGNOSIS — R634 Abnormal weight loss: Secondary | ICD-10-CM | POA: Diagnosis not present

## 2017-10-20 DIAGNOSIS — F0390 Unspecified dementia without behavioral disturbance: Secondary | ICD-10-CM | POA: Diagnosis not present

## 2017-10-20 DIAGNOSIS — E039 Hypothyroidism, unspecified: Secondary | ICD-10-CM | POA: Diagnosis not present

## 2017-10-20 DIAGNOSIS — B351 Tinea unguium: Secondary | ICD-10-CM | POA: Diagnosis not present

## 2017-10-28 DIAGNOSIS — E559 Vitamin D deficiency, unspecified: Secondary | ICD-10-CM | POA: Diagnosis not present

## 2017-10-28 DIAGNOSIS — E039 Hypothyroidism, unspecified: Secondary | ICD-10-CM | POA: Diagnosis not present

## 2017-10-28 DIAGNOSIS — Z79899 Other long term (current) drug therapy: Secondary | ICD-10-CM | POA: Diagnosis not present

## 2017-10-28 DIAGNOSIS — H05011 Cellulitis of right orbit: Secondary | ICD-10-CM | POA: Diagnosis not present

## 2017-10-28 DIAGNOSIS — R69 Illness, unspecified: Secondary | ICD-10-CM | POA: Diagnosis not present

## 2017-10-28 DIAGNOSIS — D649 Anemia, unspecified: Secondary | ICD-10-CM | POA: Diagnosis not present

## 2017-10-28 DIAGNOSIS — E119 Type 2 diabetes mellitus without complications: Secondary | ICD-10-CM | POA: Diagnosis not present

## 2017-10-28 DIAGNOSIS — R6889 Other general symptoms and signs: Secondary | ICD-10-CM | POA: Diagnosis not present

## 2017-10-28 DIAGNOSIS — E785 Hyperlipidemia, unspecified: Secondary | ICD-10-CM | POA: Diagnosis not present

## 2017-10-31 DIAGNOSIS — S61411A Laceration without foreign body of right hand, initial encounter: Secondary | ICD-10-CM | POA: Diagnosis not present

## 2017-11-02 DIAGNOSIS — S61411A Laceration without foreign body of right hand, initial encounter: Secondary | ICD-10-CM | POA: Diagnosis not present

## 2017-11-02 DIAGNOSIS — G309 Alzheimer's disease, unspecified: Secondary | ICD-10-CM | POA: Diagnosis not present

## 2017-11-02 DIAGNOSIS — Z6830 Body mass index (BMI) 30.0-30.9, adult: Secondary | ICD-10-CM | POA: Diagnosis not present

## 2017-11-17 DIAGNOSIS — F0390 Unspecified dementia without behavioral disturbance: Secondary | ICD-10-CM | POA: Diagnosis not present

## 2017-11-17 DIAGNOSIS — H6123 Impacted cerumen, bilateral: Secondary | ICD-10-CM | POA: Diagnosis not present

## 2018-04-07 DIAGNOSIS — Z23 Encounter for immunization: Secondary | ICD-10-CM | POA: Diagnosis not present

## 2018-04-07 DIAGNOSIS — H1032 Unspecified acute conjunctivitis, left eye: Secondary | ICD-10-CM | POA: Diagnosis not present

## 2018-04-16 DIAGNOSIS — N390 Urinary tract infection, site not specified: Secondary | ICD-10-CM | POA: Diagnosis not present

## 2018-04-20 DIAGNOSIS — L03115 Cellulitis of right lower limb: Secondary | ICD-10-CM | POA: Diagnosis not present

## 2018-05-18 DIAGNOSIS — M24576 Contracture, unspecified foot: Secondary | ICD-10-CM | POA: Diagnosis not present

## 2018-05-18 DIAGNOSIS — L600 Ingrowing nail: Secondary | ICD-10-CM | POA: Diagnosis not present

## 2018-05-18 DIAGNOSIS — M79673 Pain in unspecified foot: Secondary | ICD-10-CM | POA: Diagnosis not present

## 2018-05-18 DIAGNOSIS — R600 Localized edema: Secondary | ICD-10-CM | POA: Diagnosis not present

## 2018-05-18 DIAGNOSIS — M2010 Hallux valgus (acquired), unspecified foot: Secondary | ICD-10-CM | POA: Diagnosis not present

## 2018-06-14 DIAGNOSIS — W1830XA Fall on same level, unspecified, initial encounter: Secondary | ICD-10-CM | POA: Diagnosis not present

## 2018-06-14 DIAGNOSIS — S20229A Contusion of unspecified back wall of thorax, initial encounter: Secondary | ICD-10-CM | POA: Diagnosis not present

## 2018-06-14 DIAGNOSIS — W19XXXA Unspecified fall, initial encounter: Secondary | ICD-10-CM | POA: Diagnosis not present

## 2018-06-14 DIAGNOSIS — R410 Disorientation, unspecified: Secondary | ICD-10-CM | POA: Diagnosis not present

## 2018-06-14 DIAGNOSIS — Z888 Allergy status to other drugs, medicaments and biological substances status: Secondary | ICD-10-CM | POA: Diagnosis not present

## 2018-06-14 DIAGNOSIS — R52 Pain, unspecified: Secondary | ICD-10-CM | POA: Diagnosis not present

## 2018-06-14 DIAGNOSIS — S0003XA Contusion of scalp, initial encounter: Secondary | ICD-10-CM | POA: Diagnosis not present

## 2018-06-14 DIAGNOSIS — Z79899 Other long term (current) drug therapy: Secondary | ICD-10-CM | POA: Diagnosis not present

## 2018-06-14 DIAGNOSIS — F0390 Unspecified dementia without behavioral disturbance: Secondary | ICD-10-CM | POA: Diagnosis not present

## 2018-06-14 DIAGNOSIS — S20219A Contusion of unspecified front wall of thorax, initial encounter: Secondary | ICD-10-CM | POA: Diagnosis not present

## 2018-06-14 DIAGNOSIS — R296 Repeated falls: Secondary | ICD-10-CM | POA: Diagnosis not present

## 2018-06-21 DIAGNOSIS — M7981 Nontraumatic hematoma of soft tissue: Secondary | ICD-10-CM | POA: Diagnosis not present

## 2018-06-21 DIAGNOSIS — S7002XA Contusion of left hip, initial encounter: Secondary | ICD-10-CM | POA: Diagnosis not present

## 2018-06-21 DIAGNOSIS — R410 Disorientation, unspecified: Secondary | ICD-10-CM | POA: Diagnosis not present

## 2018-06-21 DIAGNOSIS — S5012XA Contusion of left forearm, initial encounter: Secondary | ICD-10-CM | POA: Diagnosis not present

## 2018-06-21 DIAGNOSIS — R52 Pain, unspecified: Secondary | ICD-10-CM | POA: Diagnosis not present

## 2018-06-21 DIAGNOSIS — Z888 Allergy status to other drugs, medicaments and biological substances status: Secondary | ICD-10-CM | POA: Diagnosis not present

## 2018-06-21 DIAGNOSIS — Z881 Allergy status to other antibiotic agents status: Secondary | ICD-10-CM | POA: Diagnosis not present

## 2018-06-21 DIAGNOSIS — S5011XA Contusion of right forearm, initial encounter: Secondary | ICD-10-CM | POA: Diagnosis not present

## 2018-06-21 DIAGNOSIS — R456 Violent behavior: Secondary | ICD-10-CM | POA: Diagnosis not present

## 2018-06-22 DIAGNOSIS — M5030 Other cervical disc degeneration, unspecified cervical region: Secondary | ICD-10-CM | POA: Diagnosis not present

## 2018-06-22 DIAGNOSIS — R4182 Altered mental status, unspecified: Secondary | ICD-10-CM | POA: Diagnosis not present

## 2018-06-22 DIAGNOSIS — W1830XA Fall on same level, unspecified, initial encounter: Secondary | ICD-10-CM | POA: Diagnosis not present

## 2018-06-22 DIAGNOSIS — S098XXA Other specified injuries of head, initial encounter: Secondary | ICD-10-CM | POA: Diagnosis not present

## 2018-06-22 DIAGNOSIS — S0990XS Unspecified injury of head, sequela: Secondary | ICD-10-CM | POA: Diagnosis not present

## 2018-06-22 DIAGNOSIS — Y92099 Unspecified place in other non-institutional residence as the place of occurrence of the external cause: Secondary | ICD-10-CM | POA: Diagnosis not present

## 2018-06-22 DIAGNOSIS — S0091XA Abrasion of unspecified part of head, initial encounter: Secondary | ICD-10-CM | POA: Diagnosis not present

## 2018-06-22 DIAGNOSIS — S0081XA Abrasion of other part of head, initial encounter: Secondary | ICD-10-CM | POA: Diagnosis not present

## 2018-06-22 DIAGNOSIS — F0390 Unspecified dementia without behavioral disturbance: Secondary | ICD-10-CM | POA: Diagnosis not present

## 2018-06-22 DIAGNOSIS — R220 Localized swelling, mass and lump, head: Secondary | ICD-10-CM | POA: Diagnosis not present

## 2018-06-22 DIAGNOSIS — Z888 Allergy status to other drugs, medicaments and biological substances status: Secondary | ICD-10-CM | POA: Diagnosis not present

## 2018-06-22 DIAGNOSIS — Z881 Allergy status to other antibiotic agents status: Secondary | ICD-10-CM | POA: Diagnosis not present
